# Patient Record
Sex: Male | Born: 1935 | Hispanic: No | Smoking: Former smoker
Health system: Southern US, Community
[De-identification: ages and names within clinical notes are randomized; demographics above are authoritative.]

## PROBLEM LIST (undated history)

## (undated) DIAGNOSIS — G40909 Epilepsy, unspecified, not intractable, without status epilepticus: Secondary | ICD-10-CM

## (undated) DIAGNOSIS — I10 Essential (primary) hypertension: Secondary | ICD-10-CM

## (undated) DIAGNOSIS — I639 Cerebral infarction, unspecified: Secondary | ICD-10-CM

## (undated) DIAGNOSIS — F0390 Unspecified dementia without behavioral disturbance: Secondary | ICD-10-CM

## (undated) DIAGNOSIS — S0990XA Unspecified injury of head, initial encounter: Secondary | ICD-10-CM

## (undated) DIAGNOSIS — E785 Hyperlipidemia, unspecified: Secondary | ICD-10-CM

## (undated) HISTORY — DX: Hyperlipidemia, unspecified: E78.5

## (undated) HISTORY — DX: Unspecified injury of head, initial encounter: S09.90XA

## (undated) HISTORY — DX: Epilepsy, unspecified, not intractable, without status epilepticus: G40.909

## (undated) HISTORY — DX: Unspecified dementia, unspecified severity, without behavioral disturbance, psychotic disturbance, mood disturbance, and anxiety: F03.90

## (undated) HISTORY — DX: Cerebral infarction, unspecified: I63.9

## (undated) HISTORY — PX: OTHER SURGICAL HISTORY: SHX169

## (undated) HISTORY — DX: Essential (primary) hypertension: I10

---

## 1968-02-19 DIAGNOSIS — S0990XA Unspecified injury of head, initial encounter: Secondary | ICD-10-CM

## 1968-02-19 HISTORY — DX: Unspecified injury of head, initial encounter: S09.90XA

## 2008-09-26 LAB — HM COLONOSCOPY: HM Colonoscopy: NORMAL

## 2009-11-21 ENCOUNTER — Ambulatory Visit: Payer: Self-pay | Admitting: Internal Medicine

## 2009-11-21 DIAGNOSIS — H9190 Unspecified hearing loss, unspecified ear: Secondary | ICD-10-CM | POA: Insufficient documentation

## 2009-11-21 DIAGNOSIS — M1710 Unilateral primary osteoarthritis, unspecified knee: Secondary | ICD-10-CM

## 2009-11-21 DIAGNOSIS — I10 Essential (primary) hypertension: Secondary | ICD-10-CM | POA: Insufficient documentation

## 2009-11-21 DIAGNOSIS — E785 Hyperlipidemia, unspecified: Secondary | ICD-10-CM | POA: Insufficient documentation

## 2009-11-21 DIAGNOSIS — F0390 Unspecified dementia without behavioral disturbance: Secondary | ICD-10-CM | POA: Insufficient documentation

## 2009-11-21 DIAGNOSIS — Z8679 Personal history of other diseases of the circulatory system: Secondary | ICD-10-CM | POA: Insufficient documentation

## 2009-11-21 DIAGNOSIS — R569 Unspecified convulsions: Secondary | ICD-10-CM | POA: Insufficient documentation

## 2009-11-21 DIAGNOSIS — H4089 Other specified glaucoma: Secondary | ICD-10-CM

## 2009-11-21 LAB — CONVERTED CEMR LAB
Albumin: 4.2 g/dL (ref 3.5–5.2)
BUN: 16 mg/dL (ref 6–23)
Basophils Absolute: 0 10*3/uL (ref 0.0–0.1)
CO2: 32 meq/L (ref 19–32)
Calcium: 9.5 mg/dL (ref 8.4–10.5)
Cholesterol: 174 mg/dL (ref 0–200)
Eosinophils Absolute: 0.1 10*3/uL (ref 0.0–0.7)
Glucose, Bld: 101 mg/dL — ABNORMAL HIGH (ref 70–99)
HCT: 41.7 % (ref 39.0–52.0)
HDL: 74.8 mg/dL (ref >39.00)
Hemoglobin: 14.1 g/dL (ref 13.0–17.0)
Leukocytes, UA: NEGATIVE
Lymphs Abs: 3 10*3/uL (ref 0.7–4.0)
MCHC: 33.8 g/dL (ref 30.0–36.0)
Neutro Abs: 2.9 10*3/uL (ref 1.4–7.7)
Potassium: 4.1 meq/L (ref 3.5–5.1)
RDW: 14.4 % (ref 11.5–14.6)
Sodium: 143 meq/L (ref 135–145)
Specific Gravity, Urine: 1.025 (ref 1.000–1.030)
TSH: 2.88 microintl units/mL (ref 0.35–5.50)
Urobilinogen, UA: 0.2 (ref 0.0–1.0)

## 2009-12-29 ENCOUNTER — Encounter: Payer: Self-pay | Admitting: Internal Medicine

## 2009-12-29 ENCOUNTER — Telehealth: Payer: Self-pay | Admitting: Internal Medicine

## 2010-01-02 ENCOUNTER — Encounter: Payer: Self-pay | Admitting: Internal Medicine

## 2010-01-17 ENCOUNTER — Encounter: Payer: Self-pay | Admitting: Internal Medicine

## 2010-01-23 ENCOUNTER — Telehealth: Payer: Self-pay | Admitting: Internal Medicine

## 2010-02-27 ENCOUNTER — Telehealth: Payer: Self-pay | Admitting: Internal Medicine

## 2010-03-01 ENCOUNTER — Telehealth: Payer: Self-pay | Admitting: Internal Medicine

## 2010-03-05 ENCOUNTER — Encounter
Admission: RE | Admit: 2010-03-05 | Discharge: 2010-03-20 | Payer: Self-pay | Source: Home / Self Care | Attending: Internal Medicine | Admitting: Internal Medicine

## 2010-03-20 NOTE — Letter (Signed)
Summary: Lipid Letter  [facility]  [address] [place], [place] [address]   Phone: [phone]  Fax: [phone]    11/21/2009  William Mills [address] [place], [place]  [address]  Dear William Mills:  We have carefully reviewed your last lipid profile from 11/21/2009 and the results are noted below with a summary of recommendations for lipid management.    Cholesterol:       174     Goal: <200   HDL "good" Cholesterol:   23.76     Goal: >40   LDL "bad" Cholesterol:   80     Goal: <130   Triglycerides:       96.0     Goal: <150    your other labs look great    TLC Diet (Therapeutic Lifestyle Change): Saturated Fats & Transfatty acids should be kept < 7% of total calories ***Reduce Saturated Fats Polyunstaurated Fat can be up to 10% of total calories Monounsaturated Fat Fat can be up to 20% of total calories Total Fat should be no greater than 25-35% of total calories Carbohydrates should be 50-60% of total calories Protein should be approximately 15% of total calories Fiber should be at least 20-30 grams a day ***Increased fiber may help lower LDL Total Cholesterol should be < 200mg /day Consider adding plant stanol/sterols to diet (example: Benacol spread) ***A higher intake of unsaturated fat may reduce Triglycerides and Increase HDL    Adjunctive Measures (may lower LIPIDS and reduce risk of Heart Attack) include: Aerobic Exercise (20-30 minutes 3-4 times a week) Limit Alcohol Consumption Weight Reduction Aspirin 75-81 mg a day by mouth (if not allergic or contraindicated) Dietary Fiber 20-30 grams a day by mouth     Current Medications: 1)    Diovan Hct 160-12.5 Mg Tabs (Valsartan-hydrochlorothiazide) .... Take 1 tablet by mouth once a day 2)    Lipitor 20 Mg Tabs (Atorvastatin calcium) .... Take 1 tablet by mouth once a day 3)    Phenobarbital 97.2 Mg Tabs (Phenobarbital) .... Take 1 tablet by mouth once a day 4)    Nifedical Xl 60 Mg  Xr24h-tab (Nifedipine) .... Take 1 tablet by mouth once a day 5)    Prednisolone Eye Drops  6)    Dorzolamide Hcl 2 % Soln (Dorzolamide hcl) 7)    Muro Drops  8)    Brimonidine Tartrate Drops   If you have any questions, please call. We appreciate being able to work with you.   Sincerely,    [facility] Etta Grandchild MD

## 2010-03-20 NOTE — Progress Notes (Signed)
Summary: Outpt REFERRAL  Phone Note From Other Clinic   Caller: [phone] -Therapist Summary of Call: Pt was seen by physical therapist - They seen no reason why pt should have home health PT but reccomend referral to outpt PT. This is to work on gait out in the community due to vestibular & visual issues.  Initial call taken by: Lamar Sprinkles, CMA,  January 23, 2010 3:54 PM

## 2010-03-20 NOTE — Miscellaneous (Signed)
Summary: Face to face visit/[facility]  Face to face visit/[facility]   Imported By: Sherian Rein 01/22/2010 11:10:21  _____________________________________________________________________  External Attachment:    Type:   Image     Comment:   External Document

## 2010-03-20 NOTE — Miscellaneous (Signed)
Summary: Updated Face to face visit / [facility]  Updated Face to face visit / [facility]   Imported By: Lennie Odor 01/23/2010 16:17:50  _____________________________________________________________________  External Attachment:    Type:   Image     Comment:   External Document

## 2010-03-20 NOTE — Letter (Signed)
Summary: Patient's Med List/[facility]  Patient's Med List/[facility]   Imported By: Sherian Rein 01/05/2010 13:12:21  _____________________________________________________________________  External Attachment:    Type:   Image     Comment:   External Document

## 2010-03-20 NOTE — Assessment & Plan Note (Signed)
Summary: NEW / MEDICARE / #  CD   Vital Signs:  Patient profile:   76 year old male Height:      74 inches Weight:      225 pounds BMI:     28.99 O2 Sat:      98 % on Room air Temp:     98.3 degrees F oral Pulse rate:   62 / minute Pulse rhythm:   regular Resp:     16 per minute BP sitting:   128 / 78  (left arm) Cuff size:   large  Vitals Entered By: Rock Nephew CMA (November 21, 2009 9:24 AM)  Nutrition Counseling: Patient's BMI is greater than 25 and therefore counseled on weight management options.  O2 Flow:  Room air CC: New to establish, Hypertension Management, Preventive Care Is Patient Diabetic? No Pain Assessment Patient in pain? no       Vision Screening:Left eye with correction: 20 / 80 Right eye with correction: 20 / 25  Color vision testing: normal       CC:  New to establish, Hypertension Management, and Preventive Care.  History of Present Illness: Here for Medicare AWV:  1.   Risk factors based on Past M, S, F history: yes see notes 2.   Physical Activities: very active 3.   Depression/mood: mood is very good 4.   Hearing: some decrease noted with whispered voice at 2 feet 5.   ADL's: he is independent in his self care and eating 6.   Fall Risk: some noted, referred to PT for balance, strength, conditioning 7.   Home Safety: very good, lives with his daughter 43.   Height, weight, &visual acuity: yes 9.   Counseling: see notes 10.   Labs ordered based on risk factors: yes 11.           Referral Coordination: yes,done 12.           Care Plan: see notes 13.            Cognitive Assessment : done, some deficits noted from HI and CVA  He complains of knees feeling unstable and occasionally giving away. right is worse than left.  Hypertension History:      He denies chest pain, palpitations, dyspnea with exertion, orthopnea, PND, peripheral edema, visual symptoms, neurologic problems, syncope, and side effects from treatment.        Positive  major cardiovascular risk factors include male age 73 years old or older, hyperlipidemia, and hypertension.  Negative major cardiovascular risk factors include negative family history for ischemic heart disease and non-tobacco-user status.        Positive history for target organ damage include prior stroke (or TIA).  Further assessment for target organ damage reveals no history of ASHD, cardiac end-organ damage (CHF/LVH), peripheral vascular disease, renal insufficiency, or hypertensive retinopathy.     Preventive Screening-Counseling & Management  Alcohol-Tobacco     Smoking Status: quit  Current Medications (verified): 1)  Diovan Hct 160-12.5 Mg Tabs (Valsartan-Hydrochlorothiazide) .... Take 1 Tablet By Mouth Once A Day 2)  Lipitor 20 Mg Tabs (Atorvastatin Calcium) .... Take 1 Tablet By Mouth Once A Day 3)  Phenobarbital 97.2 Mg Tabs (Phenobarbital) .... Take 1 Tablet By Mouth Once A Day 4)  Nifedical Xl 60 Mg Xr24h-Tab (Nifedipine) .... Take 1 Tablet By Mouth Once A Day 5)  Prednisolone Eye Drops 6)  Dorzolamide Hcl 2 % Soln (Dorzolamide Hcl) 7)  Muro Drops 8)  Brimonidine Tartrate  Drops  Allergies (verified): 1)  ! Penicillin 2)  ! Dilantin 3)  ! Tegretol 4)  ! * Mri  Past History:  Past Medical History: Dementia Hyperlipidemia Hypertension Seizure disorder Cerebrovascular accident, hx of Right frontal head injury 1970  Past Surgical History: release of right undescended testicle  Social History: Smoking Status:  quit  Review of Systems       The patient complains of vision loss, decreased hearing, and difficulty walking.  The patient denies anorexia, fever, weight loss, weight gain, hoarseness, chest pain, syncope, dyspnea on exertion, peripheral edema, prolonged cough, headaches, hemoptysis, abdominal pain, melena, hematochezia, severe indigestion/heartburn, hematuria, muscle weakness, suspicious skin lesions, depression, abnormal bleeding, enlarged lymph nodes,  angioedema, and testicular masses.   ENT:  Complains of decreased hearing; denies difficulty swallowing, ear discharge, earache, hoarseness, nasal congestion, nosebleeds, postnasal drainage, ringing in ears, sinus pressure, and sore throat. GU:  Denies discharge, dysuria, hematuria, incontinence, nocturia, urinary frequency, and urinary hesitancy. Neuro:  Complains of memory loss and poor balance; denies brief paralysis, difficulty with concentration, disturbances in coordination, falling down, headaches, inability to speak, numbness, seizures, sensation of room spinning, tingling, tremors, visual disturbances, and weakness.  Physical Exam  General:  alert, well-developed, well-nourished, well-hydrated, appropriate dress, normal appearance, healthy-appearing, cooperative to examination, and good hygiene.   Head:  right frontal scalp scar and keloid Eyes:  vision grossly intact, pupils equal, pupils round, and pupils reactive to light.   Ears:  External ear exam shows no significant lesions or deformities.  Otoscopic examination reveals clear canals, tympanic membranes are intact bilaterally without bulging, retraction, inflammation or discharge. Hearing is grossly normal bilaterally. Nose:  External nasal examination shows no deformity or inflammation. Nasal mucosa are pink and moist without lesions or exudates. Mouth:  Oral mucosa and oropharynx without lesions or exudates.  Teeth in good repair. Neck:  supple, full ROM, no masses, no thyromegaly, no thyroid nodules or tenderness, no JVD, normal carotid upstroke, no carotid bruits, no cervical lymphadenopathy, and no neck tenderness.   Chest Wall:  No deformities, masses, tenderness or gynecomastia noted. Breasts:  No masses or gynecomastia noted Lungs:  Normal respiratory effort, chest expands symmetrically. Lungs are clear to auscultation, no crackles or wheezes. Heart:  Normal rate and regular rhythm. S1 and S2 normal without gallop, murmur,  click, rub or other extra sounds. Abdomen:  soft, non-tender, normal bowel sounds, no distention, no masses, no guarding, no rigidity, no rebound tenderness, no abdominal hernia, no inguinal hernia, no hepatomegaly, no splenomegaly, and abdominal scar(s).   Rectal:  No external abnormalities noted. Normal sphincter tone. No rectal masses or tenderness. Genitalia:  circumcised, no hydrocele, no varicocele, no scrotal masses, no cutaneous lesions, no urethral discharge, and R testes atrophic.   Prostate:  no nodules, no asymmetry, no induration, and 2+ enlarged.   Msk:  he has changes in his knees c/ djd but no effusions or joint laxity is noted. Pulses:  R and L carotid,radial,femoral,dorsalis pedis and posterior tibial pulses are full and equal bilaterally Extremities:  No clubbing, cyanosis, edema, or deformity noted with normal full range of motion of all joints.   Neurologic:  alert & oriented X3, cranial nerves II-XII intact, strength normal in all extremities, sensation intact to light touch, sensation intact to pinprick, DTRs symmetrical and normal, and ataxic gait.   Skin:  turgor normal, color normal, no rashes, no suspicious lesions, no ecchymoses, no petechiae, no purpura, no ulcerations, and no edema.   Cervical Nodes:  No  lymphadenopathy noted Axillary Nodes:  No palpable lymphadenopathy Inguinal Nodes:  No significant adenopathy Psych:  Oriented X3, normally interactive, good eye contact, not anxious appearing, not depressed appearing, not agitated, not suicidal, not homicidal, easily distracted, poor concentration, and memory impairment.     Impression & Recommendations:  Problem # 1:  ROUTINE GENERAL MEDICAL EXAM@HEALTH  CARE FACL (ICD-V70.0) Assessment New  Orders: Venipuncture ([address]) TLB-Lipid Panel (80061-LIPID) TLB-BMP (Basic Metabolic Panel-BMET) (80048-METABOL) TLB-CBC Platelet - w/Differential (85025-CBCD) TLB-Hepatic/Liver Function Pnl (80076-HEPATIC) TLB-TSH  (Thyroid Stimulating Hormone) (84443-TSH) TLB-Udip w/ Micro (81001-URINE) TLB-PSA (Prostate Specific Antigen) (84153-PSA) MC -Subsequent Annual Wellness Visit [phone])  Colonoscopy: Normal (09/26/2008)  Discussed using sunscreen, use of alcohol, drug use, self testicular exam, routine dental care, routine eye care, routine physical exam, seat belts, multiple vitamins, osteoporosis prevention, adequate calcium intake in diet, and recommendations for immunizations.  Discussed exercise and checking cholesterol.  Also recommend checking PSA.  Problem # 2:  DEGENERATIVE JOINT DISEASE, BOTH KNEES, SEVERE (ICD-715.96) Assessment: New  Orders: Physical Therapy Referral (PT)  Problem # 3:  HYPERTENSION (ICD-401.9) Assessment: Improved  His updated medication list for this problem includes:    Diovan Hct 160-12.5 Mg Tabs (Valsartan-hydrochlorothiazide) .Marland Kitchen... Take 1 tablet by mouth once a day    Nifedical Xl 60 Mg Xr24h-tab (Nifedipine) .Marland Kitchen... Take 1 tablet by mouth once a day  Orders: Venipuncture ([address]) TLB-Lipid Panel (80061-LIPID) TLB-BMP (Basic Metabolic Panel-BMET) (80048-METABOL) TLB-CBC Platelet - w/Differential (85025-CBCD) TLB-Hepatic/Liver Function Pnl (80076-HEPATIC) TLB-TSH (Thyroid Stimulating Hormone) (84443-TSH) TLB-Udip w/ Micro (81001-URINE) TLB-PSA (Prostate Specific Antigen) (84153-PSA)  Problem # 4:  SEIZURE DISORDER (ICD-780.39) Assessment: Improved  His updated medication list for this problem includes:    Phenobarbital 97.2 Mg Tabs (Phenobarbital) .Marland Kitchen... Take 1 tablet by mouth once a day  Orders: Venipuncture ([address]) T- * Misc. Laboratory test [phone] TLB-Lipid Panel (80061-LIPID) TLB-BMP (Basic Metabolic Panel-BMET) (80048-METABOL) TLB-CBC Platelet - w/Differential (85025-CBCD) TLB-Hepatic/Liver Function Pnl (80076-HEPATIC) TLB-TSH (Thyroid Stimulating Hormone) (84443-TSH) TLB-Udip w/ Micro (81001-URINE) TLB-PSA (Prostate Specific Antigen)  (84153-PSA)  Problem # 5:  DEMENTIA (ICD-294.8) Assessment: Unchanged  Problem # 6:  UNSPECIFIED HEARING LOSS (ICD-389.9) Assessment: New  Orders: Audiology (Audio)  Problem # 7:  OTHER SPECIFIED GLAUCOMA (ICD-365.89) Assessment: Unchanged  Orders: Ophthalmology Referral (Ophthalmology)  Complete Medication List: 1)  Diovan Hct 160-12.5 Mg Tabs (Valsartan-hydrochlorothiazide) .... Take 1 tablet by mouth once a day 2)  Lipitor 20 Mg Tabs (Atorvastatin calcium) .... Take 1 tablet by mouth once a day 3)  Phenobarbital 97.2 Mg Tabs (Phenobarbital) .... Take 1 tablet by mouth once a day 4)  Nifedical Xl 60 Mg Xr24h-tab (Nifedipine) .... Take 1 tablet by mouth once a day 5)  Prednisolone Eye Drops  6)  Dorzolamide Hcl 2 % Soln (Dorzolamide hcl) 7)  Muro Drops  8)  Brimonidine Tartrate Drops   Hypertension Assessment/Plan:      The patient's hypertensive risk group is category C: Target organ damage and/or diabetes.  Today's blood pressure is 128/78.  His blood pressure goal is < 140/90.  Colorectal Screening:  Current Recommendations:    Hemoccult: NEG X 1 today  Colonoscopy Results:    Date of Exam: 09/26/2008    Results: Normal  PSA Screening:    Reviewed PSA screening recommendations: PSA ordered  Patient Instructions: 1)  Please schedule a follow-up appointment in 2 months. 2)  It is important that you exercise regularly at least 20 minutes 5 times a week. If you develop chest pain, have severe difficulty breathing, or feel very tired ,  stop exercising immediately and seek medical attention. 3)  Check your Blood Pressure regularly. If it is above 130/80: you should make an appointment.

## 2010-03-20 NOTE — Progress Notes (Signed)
Summary: refills  Phone Note Call from Patient   Caller: walk in sheet  / daughter Peggye Ley Summary of Call: needs refills called in to [facility] [address] or for pick up.  has enough to last to Monday.   diovan, lipitor, phenobarbital, eye drops:dorzolamide hcl and brimonidine tartrte drops. cell for daughter is [phone] Initial call taken by: Hilarie Fredrickson,  December 29, 2009 12:02 PM  Follow-up for Phone Call        [facility] for refill of all requested?  Follow-up by: Lamar Sprinkles, CMA,  December 29, 2009 12:34 PM  Additional Follow-up for Phone Call Additional follow up Details #1::        yes Additional Follow-up by: Etta Grandchild MD,  December 29, 2009 12:51 PM    Additional Follow-up for Phone Call Additional follow up Details #2::    Unsure of specific details of eyedrops needed. Attempted to call daughter, # disconnected, verified # with pt - it is correct. Pt was sure of cosopt eyedrops but unsure of alphagan. [facility]them and requested med list to be faxed to my attention........................Marland KitchenLamar Sprinkles, CMA  January 01, 2010 11:34 AM  Recieved [facility] w[facility]. EMR updated & refills sent in.  Follow-up by: Lamar Sprinkles, CMA,  January 02, 2010 11:08 AM  New/Updated Medications: LUMIGAN 0.01 % SOLN (BIMATOPROST) 1 drop left eye at bedtime ATROPINE SULFATE 1 % SOLN (ATROPINE SULFATE) 1 drop two times a day COSOPT 22.3-6.8 MG/ML SOLN (DORZOLAMIDE HCL-TIMOLOL MAL) 1 drop each eye two times a day COMBIGAN 0.2-0.5 % SOLN (BRIMONIDINE TARTRATE-TIMOLOL) 1 drop right  two times a day Prescriptions: LUMIGAN 0.01 % SOLN (BIMATOPROST) 1 drop left eye at bedtime  #1 x 3   Entered by:   Lamar Sprinkles, CMA   Authorized by:   Etta Grandchild MD   Signed by:   Lamar Sprinkles, CMA on 01/02/2010   Method used:   Electronically to        [facility] [id number] (retail)       [address] [place], [place]   [address]       Ph: [phone]       Fax: [phone]   RxID:   [id number] ATROPINE SULFATE 1 % SOLN (ATROPINE SULFATE) 1 drop two times a day  #1 x 3   Entered by:   Lamar Sprinkles, CMA   Authorized by:   Etta Grandchild MD   Signed by:   Lamar Sprinkles, CMA on 01/02/2010   Method used:   Electronically to        [facility] [id number]* (retail)       [address] [place], [place]  [address]       Ph: [phone]       Fax: [phone]   RxID:   [id number] COMBIGAN 0.2-0.5 % SOLN (BRIMONIDINE TARTRATE-TIMOLOL) 1 drop right  two times a day  #1 x 3   Entered by:   Lamar Sprinkles, CMA   Authorized by:   Etta Grandchild MD   Signed by:   Lamar Sprinkles, CMA on 01/02/2010   Method used:   Electronically to        [facility] [id number]* (retail)       [address]       [place], [place]  [address]  Ph: [phone]       Fax: [phone]   RxID:   [id number] LIPITOR 20 MG TABS (ATORVASTATIN CALCIUM) Take 1 tablet by mouth once a day  #90 x 3   Entered by:   Lamar Sprinkles, CMA   Authorized by:   Etta Grandchild MD   Signed by:   Lamar Sprinkles, CMA on 12/29/2009   Method used:   Electronically to        [id number]* (retail)       [address] [place], [place]  [address]       Ph: [phone]       Fax: [phone]   RxID:   [id number] DIOVAN HCT 160-12.5 MG TABS (VALSARTAN-HYDROCHLOROTHIAZIDE) Take 1 tablet by mouth once a day  #90 x 3   Entered by:   Lamar Sprinkles, CMA   Authorized by:   Etta Grandchild MD   Signed by:   Lamar Sprinkles, CMA on 12/29/2009   Method used:   Electronically to        [facility] [id number]* (retail)       [address] [place], [place]  [address]       Ph: [phone]       Fax: [phone]   RxID:   [id number]

## 2010-03-20 NOTE — Therapy (Signed)
Summary: [facility]   [facility]   Imported By: Lennie Odor 01/08/2010 15:41:08  _____________________________________________________________________  External Attachment:    Type:   Image     Comment:   External Document

## 2010-03-21 ENCOUNTER — Ambulatory Visit: Payer: Medicare Other | Attending: Internal Medicine | Admitting: Physical Therapy

## 2010-03-21 DIAGNOSIS — R269 Unspecified abnormalities of gait and mobility: Secondary | ICD-10-CM | POA: Insufficient documentation

## 2010-03-21 DIAGNOSIS — IMO0001 Reserved for inherently not codable concepts without codable children: Secondary | ICD-10-CM | POA: Insufficient documentation

## 2010-03-22 NOTE — Progress Notes (Signed)
Summary: med question  Phone Note From Pharmacy   Caller: [facility] Summary of Call: Recieved fax stating that they received rx for phenobarb 97.2. Patient is also taking Nifedipine, per pharmacy the two drugs interact. The phenobarb can decreas the plasma levels of the nifedipine. They want to know if you want patient  to take both medication. Please advise Thaks.Marland KitchenMarland KitchenAlvy Beal Archie CMA  March 01, 2010 10:01 AM   Follow-up for Phone Call        yes Follow-up by: Etta Grandchild MD,  March 01, 2010 10:40 AM  Additional Follow-up for Phone Call Additional follow up Details #1::        Pharmacy notified via fax [phone].Alvy Beal Archie CMA  March 01, 2010 10:47 AM

## 2010-03-22 NOTE — Progress Notes (Signed)
Summary: RFs  Phone Note Call from Patient   Caller: Nannette - [phone] Summary of Call: Pt's daughter called and left vm. Pt needs refill on phenobarbitol and rx's now [facility] but did not specify which one.  Initial call taken by: Lamar Sprinkles, CMA,  February 27, 2010 3:47 PM  Follow-up for Phone Call        Rfs sent in daughter informed.  Follow-up by: Lamar Sprinkles, CMA,  February 28, 2010 4:35 PM    Prescriptions: COMBIGAN 0.2-0.5 % SOLN (BRIMONIDINE TARTRATE-TIMOLOL) 1 drop right  two times a day  #1 x 6   Entered by:   Lamar Sprinkles, CMA   Authorized by:   Etta Grandchild MD   Signed by:   Lamar Sprinkles, CMA on 02/28/2010   Method used:   Electronically to        [facility] (retail)       [address]       [place], [place]  [address]       Ph: [phone]       Fax: [phone]   RxID:   [id number] ATROPINE SULFATE 1 % SOLN (ATROPINE SULFATE) 1 drop two times a day  #1 x 6   Entered by:   Lamar Sprinkles, CMA   Authorized by:   Etta Grandchild MD   Signed by:   Lamar Sprinkles, CMA on 02/28/2010   Method used:   Electronically to        [facility] (retail)       [address]       [place], [place]  [address]       Ph: [phone]       Fax: [phone]   RxID:   [id number] LUMIGAN 0.01 % SOLN (BIMATOPROST) 1 drop left eye at bedtime  #1 x 6   Entered by:   Lamar Sprinkles, CMA   Authorized by:   Etta Grandchild MD   Signed by:   Lamar Sprinkles, CMA on 02/28/2010   Method used:   Electronically to        [facility] (retail)       [address]       [place], [place]  [address]       Ph: [phone]       Fax: [phone]   RxID:   [id number] NIFEDICAL XL 60 MG XR24H-TAB (NIFEDIPINE) Take 1 tablet by mouth once a day  #90 x 1   Entered by:   Lamar Sprinkles, CMA   Authorized by:   Etta Grandchild MD   Signed by:   Lamar Sprinkles, CMA on  02/28/2010   Method used:   Electronically to        [facility] (retail)       [address]       [place], [place]  [address]       Ph: [phone]       Fax: [phone]   RxID:   [id number] LIPITOR 20 MG TABS (ATORVASTATIN CALCIUM) Take 1 tablet by mouth once a day  #90 x 1   Entered by:   Lamar Sprinkles, CMA   Authorized by:   Etta Grandchild MD   Signed  by:   Lamar Sprinkles, CMA on 02/28/2010   Method used:   Electronically to        [facility] (retail)       [address]       [place], [place]  [address]       Ph: [phone]       Fax: [phone]   RxID:   [id number] DIOVAN HCT 160-12.5 MG TABS (VALSARTAN-HYDROCHLOROTHIAZIDE) Take 1 tablet by mouth once a day  #90 x 1   Entered by:   Lamar Sprinkles, CMA   Authorized by:   Etta Grandchild MD   Signed by:   Lamar Sprinkles, CMA on 02/28/2010   Method used:   Electronically to        [facility] (retail)       [address]       [place], [place]  [address]       Ph: [phone]       Fax: [phone]   RxID:   [id number] PHENOBARBITAL 97.2 MG TABS (PHENOBARBITAL) Take 1 tablet by mouth once a day  #90 x 1   Entered by:   Lamar Sprinkles, CMA   Authorized by:   Etta Grandchild MD   Signed by:   Lamar Sprinkles, CMA on 02/28/2010   Method used:   Telephoned to ...       [facility]       [address]       [place], [place]  [address]       Ph: [phone]       Fax: [phone]   RxID:   [id number]

## 2010-03-27 ENCOUNTER — Ambulatory Visit: Payer: Medicare Other | Admitting: Physical Therapy

## 2010-03-29 ENCOUNTER — Ambulatory Visit: Payer: Medicare Other | Admitting: Physical Therapy

## 2010-04-02 ENCOUNTER — Ambulatory Visit: Payer: Medicare Other | Admitting: Physical Therapy

## 2010-04-05 ENCOUNTER — Ambulatory Visit: Payer: Medicare Other | Admitting: Physical Therapy

## 2010-04-05 ENCOUNTER — Encounter: Payer: Self-pay | Admitting: Internal Medicine

## 2010-04-26 NOTE — Miscellaneous (Signed)
Summary: D/C from PT/[place]  D/C from PT/[place] [facility]   Imported By: Lester Townsend 04/18/2010 08:36:44  _____________________________________________________________________  External Attachment:    Type:   Image     Comment:   External Document

## 2010-07-12 ENCOUNTER — Encounter: Payer: Self-pay | Admitting: Internal Medicine

## 2010-07-17 ENCOUNTER — Ambulatory Visit (INDEPENDENT_AMBULATORY_CARE_PROVIDER_SITE_OTHER): Payer: Medicare Other | Admitting: Internal Medicine

## 2010-07-17 ENCOUNTER — Encounter: Payer: Self-pay | Admitting: Internal Medicine

## 2010-07-17 ENCOUNTER — Other Ambulatory Visit (INDEPENDENT_AMBULATORY_CARE_PROVIDER_SITE_OTHER): Payer: Medicare Other

## 2010-07-17 DIAGNOSIS — M1710 Unilateral primary osteoarthritis, unspecified knee: Secondary | ICD-10-CM

## 2010-07-17 DIAGNOSIS — E785 Hyperlipidemia, unspecified: Secondary | ICD-10-CM

## 2010-07-17 DIAGNOSIS — I10 Essential (primary) hypertension: Secondary | ICD-10-CM

## 2010-07-17 DIAGNOSIS — Z23 Encounter for immunization: Secondary | ICD-10-CM

## 2010-07-17 LAB — LIPID PANEL
Cholesterol: 162 mg/dL (ref 0–200)
LDL Cholesterol: 66 mg/dL (ref 0–99)
VLDL: 29.8 mg/dL (ref 0.0–40.0)

## 2010-07-17 LAB — COMPREHENSIVE METABOLIC PANEL
AST: 27 U/L (ref 0–37)
Albumin: 3.9 g/dL (ref 3.5–5.2)
Alkaline Phosphatase: 67 U/L (ref 39–117)
Potassium: 3.9 mEq/L (ref 3.5–5.1)
Sodium: 141 mEq/L (ref 135–145)
Total Protein: 6.9 g/dL (ref 6.0–8.3)

## 2010-07-17 NOTE — Progress Notes (Signed)
Subjective:    Patient ID: William Mills, male    DOB: May 13, 1934, 76 y.o.   MRN: [id number]  Hypertension This is a chronic problem. The current episode started more than 1 year ago. The problem has been gradually improving since onset. The problem is controlled. Pertinent negatives include no anxiety, blurred vision, chest pain, headaches, malaise/fatigue, neck pain, orthopnea, palpitations, peripheral edema, PND, shortness of breath or sweats. There are no associated agents to hypertension. Past treatments include angiotensin blockers, diuretics and calcium channel blockers. The current treatment provides significant improvement. There is no history of angina or kidney disease.      Review of Systems  Constitutional: Negative for fever, chills, malaise/fatigue, diaphoresis, activity change, appetite change, fatigue and unexpected weight change.  HENT: Negative for facial swelling, trouble swallowing, neck pain and voice change.   Eyes: Negative for blurred vision, photophobia, pain, redness and visual disturbance.  Respiratory: Negative for cough, chest tightness, shortness of breath, wheezing and stridor.   Cardiovascular: Negative for chest pain, palpitations, orthopnea and PND.  Gastrointestinal: Negative for nausea, vomiting, abdominal pain, diarrhea and constipation.  Genitourinary: Negative for dysuria, urgency, frequency, hematuria, flank pain, decreased urine volume, enuresis and difficulty urinating.  Musculoskeletal: Positive for arthralgias (knees). Negative for myalgias, back pain, joint swelling and gait problem.  Skin: Negative for color change, pallor and rash.  Neurological: Negative for dizziness, tremors, seizures, syncope, facial asymmetry, speech difficulty, weakness, light-headedness, numbness and headaches.  Hematological: Negative for adenopathy. Does not bruise/bleed easily.  Psychiatric/Behavioral: Negative.        Objective:   Physical Exam  Vitals  reviewed. Constitutional: He is oriented to person, place, and time. He appears well-developed and well-nourished. No distress.  HENT:  Head: Normocephalic and atraumatic.  Right Ear: External ear normal.  Left Ear: External ear normal.  Nose: Nose normal.  Mouth/Throat: Oropharynx is clear and moist. No oropharyngeal exudate.  Eyes: Conjunctivae and EOM are normal. Pupils are equal, round, and reactive to light. Right eye exhibits no discharge. Left eye exhibits no discharge. No scleral icterus.  Neck: Normal range of motion. Neck supple. No JVD present. No tracheal deviation present. No thyromegaly present.  Cardiovascular: Normal rate, regular rhythm, normal heart sounds and intact distal pulses.  Exam reveals no gallop and no friction rub.   No murmur heard. Pulmonary/Chest: Effort normal and breath sounds normal. No stridor. No respiratory distress. He has no wheezes. He has no rales. He exhibits no tenderness.  Abdominal: Soft. Bowel sounds are normal. He exhibits no distension and no mass. There is no tenderness. There is no rebound and no guarding.  Musculoskeletal: Normal range of motion. He exhibits no edema and no tenderness.  Lymphadenopathy:    He has no cervical adenopathy.  Neurological: He is alert and oriented to person, place, and time. He has normal reflexes. No cranial nerve deficit. Coordination normal.  Skin: Skin is warm and dry. No rash noted. He is not diaphoretic. No erythema. No pallor.  Psychiatric: He has a normal mood and affect. His behavior is normal. Judgment and thought content normal.        Lab Results  Component Value Date   WBC 6.3 11/21/2009   HGB 14.1 11/21/2009   HCT 41.7 11/21/2009   PLT 249.0 11/21/2009   CHOL 174 11/21/2009   TRIG 96.0 11/21/2009   HDL 74.80 11/21/2009   ALT 24 11/21/2009   AST 22 11/21/2009   NA 143 11/21/2009   K 4.1 11/21/2009   CL 103  11/21/2009   CREATININE 1.0 11/21/2009   BUN 16 11/21/2009   CO2 32 11/21/2009   TSH 2.88  11/21/2009   PSA 1.36 11/21/2009    Assessment & Plan:

## 2010-07-17 NOTE — Patient Instructions (Signed)

## 2010-07-17 NOTE — Assessment & Plan Note (Signed)
BP is well controlled, I will monitor his lytes and renal function today 

## 2010-07-17 NOTE — Assessment & Plan Note (Signed)
He is doing well on lipitor, I will check his labs today

## 2010-07-17 NOTE — Assessment & Plan Note (Signed)
No changes, he is doing well

## 2010-08-23 ENCOUNTER — Other Ambulatory Visit: Payer: Self-pay | Admitting: Internal Medicine

## 2010-08-25 ENCOUNTER — Other Ambulatory Visit: Payer: Self-pay | Admitting: Internal Medicine

## 2010-08-27 ENCOUNTER — Telehealth: Payer: Self-pay | Admitting: *Deleted

## 2010-08-27 NOTE — Telephone Encounter (Signed)
LMOM that Rx is ready for p/u & must have f/u appointment per Dr Yetta Barre.

## 2010-08-28 ENCOUNTER — Other Ambulatory Visit: Payer: Self-pay | Admitting: Internal Medicine

## 2010-09-03 ENCOUNTER — Telehealth: Payer: Self-pay

## 2010-09-03 ENCOUNTER — Other Ambulatory Visit: Payer: Self-pay

## 2010-09-03 MED ORDER — VALSARTAN-HYDROCHLOROTHIAZIDE 160-12.5 MG PO TABS: 1.0000 | ORAL_TABLET | Freq: Every day | ORAL | Status: DC

## 2010-09-03 MED ORDER — NIFEDIPINE ER OSMOTIC RELEASE 60 MG PO TB24: 60.0000 mg | ORAL_TABLET | Freq: Every day | ORAL | Status: DC

## 2010-09-03 NOTE — Telephone Encounter (Signed)
Pt's daughter is aware that Rx was ready for pt to pick on Friday but because it still hadn't been picked up today and another request was sent from the pharmacy, [facility] faxed the Rx to the pharmacy

## 2010-09-03 NOTE — Telephone Encounter (Signed)
Received refill request for phenobarb 97.2

## 2010-10-29 ENCOUNTER — Telehealth: Payer: Self-pay | Admitting: *Deleted

## 2010-10-29 NOTE — Telephone Encounter (Signed)
Needs to be seen

## 2010-10-29 NOTE — Telephone Encounter (Signed)
Patient requesting RF of phenobarb 97.2.

## 2010-10-30 NOTE — Telephone Encounter (Signed)
Pharmacy notified.

## 2010-11-02 ENCOUNTER — Other Ambulatory Visit: Payer: Self-pay | Admitting: Internal Medicine

## 2010-11-05 ENCOUNTER — Other Ambulatory Visit: Payer: Self-pay | Admitting: Internal Medicine

## 2010-11-16 ENCOUNTER — Other Ambulatory Visit: Payer: Self-pay | Admitting: *Deleted

## 2010-11-16 MED ORDER — ATORVASTATIN CALCIUM 20 MG PO TABS: 20.0000 mg | ORAL_TABLET | Freq: Every day | ORAL | Status: DC

## 2010-12-05 ENCOUNTER — Telehealth: Payer: Self-pay

## 2010-12-05 MED ORDER — NIFEDIPINE ER OSMOTIC RELEASE 60 MG PO TB24: 60.0000 mg | ORAL_TABLET | Freq: Every day | ORAL | Status: DC

## 2010-12-05 NOTE — Telephone Encounter (Signed)
Refill

## 2010-12-10 ENCOUNTER — Ambulatory Visit (INDEPENDENT_AMBULATORY_CARE_PROVIDER_SITE_OTHER): Payer: Medicare Other | Admitting: Internal Medicine

## 2010-12-10 ENCOUNTER — Encounter: Payer: Self-pay | Admitting: Internal Medicine

## 2010-12-10 ENCOUNTER — Other Ambulatory Visit (INDEPENDENT_AMBULATORY_CARE_PROVIDER_SITE_OTHER): Payer: Medicare Other

## 2010-12-10 VITALS — BP 122/70 | HR 58 | Temp 98.7°F | Resp 16 | Wt 221.0 lb

## 2010-12-10 DIAGNOSIS — E785 Hyperlipidemia, unspecified: Secondary | ICD-10-CM

## 2010-12-10 DIAGNOSIS — R569 Unspecified convulsions: Secondary | ICD-10-CM

## 2010-12-10 DIAGNOSIS — I10 Essential (primary) hypertension: Secondary | ICD-10-CM

## 2010-12-10 DIAGNOSIS — Z23 Encounter for immunization: Secondary | ICD-10-CM

## 2010-12-10 LAB — COMPREHENSIVE METABOLIC PANEL
ALT: 35 U/L (ref 0–53)
Alkaline Phosphatase: 75 U/L (ref 39–117)
CO2: 32 mEq/L (ref 19–32)
Sodium: 140 mEq/L (ref 135–145)
Total Bilirubin: 0.5 mg/dL (ref 0.3–1.2)
Total Protein: 8.1 g/dL (ref 6.0–8.3)

## 2010-12-10 LAB — CBC WITH DIFFERENTIAL/PLATELET
Basophils Absolute: 0 10*3/uL (ref 0.0–0.1)
HCT: 40.3 % (ref 39.0–52.0)
Lymphs Abs: 2.9 10*3/uL (ref 0.7–4.0)
Monocytes Absolute: 0.2 10*3/uL (ref 0.1–1.0)
Monocytes Relative: 3.7 % (ref 3.0–12.0)
Platelets: 243 10*3/uL (ref 150.0–400.0)
RDW: 14.4 % (ref 11.5–14.6)

## 2010-12-10 MED ORDER — PHENOBARBITAL 97.2 MG PO TABS: 97.2000 mg | ORAL_TABLET | Freq: Every day | ORAL | Status: DC

## 2010-12-10 NOTE — Progress Notes (Signed)
Subjective:    Patient ID: William Mills, male    DOB: Dec 18, 1934, 76 y.o.   MRN: [id number]  HPI He returns for a f/up and he tells me that he feels well and offers no complaints.   Review of Systems  Constitutional: Negative for fever, chills, diaphoresis, activity change, appetite change, fatigue and unexpected weight change.  HENT: Negative.   Eyes: Negative.   Respiratory: Negative for apnea, cough, choking, chest tightness, shortness of breath, wheezing and stridor.   Cardiovascular: Negative for chest pain, palpitations and leg swelling.  Gastrointestinal: Negative for nausea, vomiting, abdominal pain, diarrhea, constipation and blood in stool.  Genitourinary: Negative for dysuria, urgency, frequency, hematuria, flank pain, decreased urine volume, enuresis and difficulty urinating.  Musculoskeletal: Negative for myalgias, back pain, joint swelling, arthralgias and gait problem.  Skin: Negative for color change, pallor, rash and wound.  Neurological: Negative for dizziness, tremors, seizures, syncope, facial asymmetry, speech difficulty, weakness, light-headedness, numbness and headaches.  Hematological: Negative for adenopathy. Does not bruise/bleed easily.  Psychiatric/Behavioral: Negative.        Objective:   Physical Exam  Vitals reviewed. Constitutional: He is oriented to person, place, and time. He appears well-developed and well-nourished. No distress.  HENT:  Head: Normocephalic and atraumatic.  Mouth/Throat: Oropharynx is clear and moist. No oropharyngeal exudate.  Eyes: Conjunctivae and EOM are normal. Pupils are equal, round, and reactive to light. Right eye exhibits no discharge. Left eye exhibits no discharge. No scleral icterus.  Neck: Normal range of motion. Neck supple. No JVD present. No tracheal deviation present. No thyromegaly present.  Cardiovascular: Normal rate, regular rhythm, normal heart sounds and intact distal pulses.  Exam reveals no gallop and no  friction rub.   No murmur heard. Pulmonary/Chest: Effort normal and breath sounds normal. No stridor. No respiratory distress. He has no wheezes. He has no rales. He exhibits no tenderness.  Abdominal: Soft. Bowel sounds are normal. He exhibits no distension and no mass. There is no tenderness. There is no rebound and no guarding.  Musculoskeletal: Normal range of motion. He exhibits no edema and no tenderness.  Lymphadenopathy:    He has no cervical adenopathy.  Neurological: He is oriented to person, place, and time. He displays normal reflexes. He exhibits normal muscle tone. Coordination normal.  Skin: Skin is warm and dry. No rash noted. He is not diaphoretic. No erythema. No pallor.  Psychiatric: He has a normal mood and affect. Judgment and thought content normal. His mood appears not anxious. His affect is not angry, not blunt, not labile and not inappropriate. His speech is delayed and tangential. His speech is not rapid and/or pressured and not slurred. He is slowed. He is not agitated, not aggressive, is not hyperactive, not withdrawn, not actively hallucinating and not combative. Thought content is not paranoid and not delusional. Cognition and memory are not impaired. He does not express impulsivity or inappropriate judgment. He does not exhibit a depressed mood. He expresses no homicidal and no suicidal ideation. He expresses no suicidal plans and no homicidal plans. He is communicative. He exhibits normal recent memory and normal remote memory. He is attentive.      Lab Results  Component Value Date   WBC 6.3 11/21/2009   HGB 14.1 11/21/2009   HCT 41.7 11/21/2009   PLT 249.0 11/21/2009   GLUCOSE 106* 07/17/2010   CHOL 162 07/17/2010   TRIG 149.0 07/17/2010   HDL 66.60 07/17/2010   LDLCALC 66 07/17/2010   ALT 36 07/17/2010  AST 27 07/17/2010   NA 141 07/17/2010   K 3.9 07/17/2010   CL 107 07/17/2010   CREATININE 0.8 07/17/2010   BUN 15 07/17/2010   CO2 27 07/17/2010   TSH 2.57  07/17/2010   PSA 1.36 11/21/2009      Assessment & Plan:

## 2010-12-10 NOTE — Patient Instructions (Signed)

## 2010-12-10 NOTE — Assessment & Plan Note (Signed)
He is doing well on atorvastatin

## 2010-12-10 NOTE — Assessment & Plan Note (Signed)
His BP is well controlled, I will check his lytes today 

## 2010-12-10 NOTE — Assessment & Plan Note (Signed)
He continues to be free of seizures, will continue phenobarb and will check his level today

## 2010-12-11 LAB — PHENOBARBITAL LEVEL: Phenobarbital: 23.8 ug/mL (ref 15.0–40.0)

## 2010-12-14 ENCOUNTER — Other Ambulatory Visit: Payer: Self-pay | Admitting: Internal Medicine

## 2010-12-14 MED ORDER — VALSARTAN-HYDROCHLOROTHIAZIDE 160-12.5 MG PO TABS: 1.0000 | ORAL_TABLET | Freq: Every day | ORAL | Status: DC

## 2011-03-01 ENCOUNTER — Other Ambulatory Visit: Payer: Self-pay | Admitting: Internal Medicine

## 2011-04-02 ENCOUNTER — Other Ambulatory Visit: Payer: Self-pay | Admitting: Internal Medicine

## 2011-04-02 ENCOUNTER — Telehealth: Payer: Self-pay

## 2011-04-02 MED ORDER — BIMATOPROST 0.01 % OP SOLN: OPHTHALMIC | Status: AC

## 2011-04-02 MED ORDER — BRIMONIDINE TARTRATE-TIMOLOL 0.2-0.5 % OP SOLN: 1.0000 [drp] | Freq: Two times a day (BID) | OPHTHALMIC | Status: AC

## 2011-04-02 NOTE — Telephone Encounter (Signed)
Patient came by and filled out a walk in sheet combigan and lumigan drops

## 2011-04-22 ENCOUNTER — Ambulatory Visit (INDEPENDENT_AMBULATORY_CARE_PROVIDER_SITE_OTHER): Payer: Medicare Other | Admitting: Internal Medicine

## 2011-04-22 ENCOUNTER — Other Ambulatory Visit (INDEPENDENT_AMBULATORY_CARE_PROVIDER_SITE_OTHER): Payer: Medicare Other

## 2011-04-22 ENCOUNTER — Encounter: Payer: Self-pay | Admitting: Internal Medicine

## 2011-04-22 DIAGNOSIS — R569 Unspecified convulsions: Secondary | ICD-10-CM | POA: Diagnosis not present

## 2011-04-22 DIAGNOSIS — E785 Hyperlipidemia, unspecified: Secondary | ICD-10-CM | POA: Diagnosis not present

## 2011-04-22 DIAGNOSIS — I10 Essential (primary) hypertension: Secondary | ICD-10-CM

## 2011-04-22 LAB — CBC WITH DIFFERENTIAL/PLATELET
Basophils Relative: 1.3 % (ref 0.0–3.0)
Eosinophils Relative: 2.9 % (ref 0.0–5.0)
HCT: 43.2 % (ref 39.0–52.0)
Lymphs Abs: 2.8 10*3/uL (ref 0.7–4.0)
MCV: 90.1 fl (ref 78.0–100.0)
Monocytes Absolute: 0.3 10*3/uL (ref 0.1–1.0)
Neutrophils Relative %: 49.4 % (ref 43.0–77.0)
RBC: 4.79 Mil/uL (ref 4.22–5.81)
WBC: 6.6 10*3/uL (ref 4.5–10.5)

## 2011-04-22 LAB — COMPREHENSIVE METABOLIC PANEL
AST: 21 U/L (ref 0–37)
Albumin: 4.2 g/dL (ref 3.5–5.2)
Alkaline Phosphatase: 73 U/L (ref 39–117)
BUN: 14 mg/dL (ref 6–23)
Potassium: 4.3 mEq/L (ref 3.5–5.1)

## 2011-04-22 LAB — LIPID PANEL
LDL Cholesterol: 70 mg/dL (ref 0–99)
Total CHOL/HDL Ratio: 2
Triglycerides: 151 mg/dL — ABNORMAL HIGH (ref 0.0–149.0)

## 2011-04-22 NOTE — Assessment & Plan Note (Signed)
He remains seizure free, I will check his Pb level today

## 2011-04-22 NOTE — Assessment & Plan Note (Signed)
He is doing well on lipitor, I will check his FLP today 

## 2011-04-22 NOTE — Assessment & Plan Note (Signed)
His BP is well controlled, I will check his lytes and renal function today 

## 2011-04-22 NOTE — Patient Instructions (Signed)

## 2011-04-22 NOTE — Progress Notes (Signed)
Subjective:    Patient ID: William Mills, male    DOB: 1934-12-18, 77 y.o.   MRN: [id number]  Hypertension This is a chronic problem. The current episode started more than 1 year ago. The problem has been gradually improving since onset. The problem is controlled. Pertinent negatives include no anxiety, blurred vision, chest pain, headaches, malaise/fatigue, neck pain, orthopnea, palpitations, peripheral edema, PND, shortness of breath or sweats. Past treatments include calcium channel blockers, angiotensin blockers and diuretics. The current treatment provides significant improvement. Compliance problems include exercise and diet.  There is no history of chronic renal disease.  Hyperlipidemia This is a chronic problem. The current episode started more than 1 year ago. The problem is controlled. Recent lipid tests were reviewed and are variable. He has no history of chronic renal disease, diabetes, hypothyroidism, liver disease, obesity or nephrotic syndrome. Factors aggravating his hyperlipidemia include no known factors. Pertinent negatives include no chest pain, focal sensory loss, focal weakness, leg pain, myalgias or shortness of breath. Current antihyperlipidemic treatment includes statins. The current treatment provides moderate improvement of lipids. Compliance problems include adherence to exercise and adherence to diet.       Review of Systems  Constitutional: Negative for fever, chills, malaise/fatigue, diaphoresis, activity change, appetite change, fatigue and unexpected weight change.  HENT: Negative.  Negative for neck pain.   Eyes: Negative.  Negative for blurred vision.  Respiratory: Negative for apnea, cough, choking, chest tightness, shortness of breath, wheezing and stridor.   Cardiovascular: Negative for chest pain, palpitations, orthopnea, leg swelling and PND.  Gastrointestinal: Negative for nausea, vomiting, abdominal pain, diarrhea and constipation.  Genitourinary:  Negative.   Musculoskeletal: Negative for myalgias, back pain, joint swelling, arthralgias and gait problem.  Skin: Negative for color change, pallor, rash and wound.  Neurological: Negative for dizziness, tremors, focal weakness, seizures, syncope, facial asymmetry, speech difficulty, weakness, light-headedness, numbness and headaches.  Hematological: Negative for adenopathy. Does not bruise/bleed easily.  Psychiatric/Behavioral: Positive for decreased concentration. Negative for suicidal ideas, hallucinations, behavioral problems, confusion, sleep disturbance, self-injury, dysphoric mood and agitation. The patient is not nervous/anxious and is not hyperactive.        Objective:   Physical Exam  Vitals reviewed. Constitutional: He is oriented to person, place, and time. He appears well-developed and well-nourished. No distress.  HENT:  Head: Normocephalic and atraumatic.  Mouth/Throat: Oropharynx is clear and moist. No oropharyngeal exudate.  Eyes: Conjunctivae are normal. Right eye exhibits no discharge. Left eye exhibits no discharge. No scleral icterus.  Neck: Normal range of motion. Neck supple. No JVD present. No tracheal deviation present. No thyromegaly present.  Cardiovascular: Normal rate, regular rhythm, normal heart sounds and intact distal pulses.  Exam reveals no gallop and no friction rub.   No murmur heard. Pulmonary/Chest: Effort normal and breath sounds normal. No stridor. No respiratory distress. He has no wheezes. He has no rales. He exhibits no tenderness.  Abdominal: Soft. Bowel sounds are normal. He exhibits no distension and no mass. There is no tenderness. There is no rebound and no guarding.  Musculoskeletal: Normal range of motion. He exhibits no edema and no tenderness.  Lymphadenopathy:    He has no cervical adenopathy.  Neurological: He is oriented to person, place, and time.  Skin: Skin is warm and dry. No rash noted. He is not diaphoretic. No erythema. No  pallor.  Psychiatric: He has a normal mood and affect. Thought content normal. His mood appears not anxious. His affect is not angry, not blunt, not  labile and not inappropriate. His speech is tangential. His speech is not rapid and/or pressured, not delayed and not slurred. He is slowed. He is not agitated, not aggressive, is not hyperactive, not withdrawn, not actively hallucinating and not combative. Thought content is not paranoid and not delusional. Cognition and memory are not impaired. He does not express impulsivity or inappropriate judgment. He does not exhibit a depressed mood. He expresses no homicidal and no suicidal ideation. He expresses no suicidal plans and no homicidal plans. He is communicative. He exhibits normal recent memory and normal remote memory. He is inattentive.      Lab Results  Component Value Date   WBC 6.6 12/10/2010   HGB 13.7 12/10/2010   HCT 40.3 12/10/2010   PLT 243.0 12/10/2010   GLUCOSE 96 12/10/2010   CHOL 162 07/17/2010   TRIG 149.0 07/17/2010   HDL 66.60 07/17/2010   LDLCALC 66 07/17/2010   ALT 35 12/10/2010   AST 27 12/10/2010   NA 140 12/10/2010   K 4.1 12/10/2010   CL 101 12/10/2010   CREATININE 0.9 12/10/2010   BUN 13 12/10/2010   CO2 32 12/10/2010   TSH 2.57 07/17/2010   PSA 1.36 11/21/2009      Assessment & Plan:

## 2011-04-30 DIAGNOSIS — M79609 Pain in unspecified limb: Secondary | ICD-10-CM | POA: Diagnosis not present

## 2011-04-30 DIAGNOSIS — M2040 Other hammer toe(s) (acquired), unspecified foot: Secondary | ICD-10-CM | POA: Diagnosis not present

## 2011-06-02 ENCOUNTER — Other Ambulatory Visit: Payer: Self-pay | Admitting: Internal Medicine

## 2011-06-27 ENCOUNTER — Other Ambulatory Visit: Payer: Self-pay

## 2011-06-27 DIAGNOSIS — R569 Unspecified convulsions: Secondary | ICD-10-CM

## 2011-06-27 MED ORDER — PHENOBARBITAL 97.2 MG PO TABS: 97.2000 mg | ORAL_TABLET | Freq: Every day | ORAL | Status: DC

## 2011-06-27 NOTE — Telephone Encounter (Signed)
Received walk in sheet from pt requesting med refill on phenobarbital. Patient last seen 04/22/11, please advise if ok to refill Thanks

## 2011-07-02 ENCOUNTER — Other Ambulatory Visit: Payer: Self-pay | Admitting: Internal Medicine

## 2011-07-02 MED ORDER — PHENOBARBITAL 97.2 MG PO TABS: 97.2000 mg | ORAL_TABLET | Freq: Every day | ORAL | Status: DC

## 2011-07-02 NOTE — Telephone Encounter (Signed)
Addended by: Rock Nephew T on: 07/02/2011 08:11 AM   Modules accepted: Orders

## 2011-07-30 DIAGNOSIS — M2040 Other hammer toe(s) (acquired), unspecified foot: Secondary | ICD-10-CM | POA: Diagnosis not present

## 2011-08-06 DIAGNOSIS — H2520 Age-related cataract, morgagnian type, unspecified eye: Secondary | ICD-10-CM | POA: Diagnosis not present

## 2011-08-06 DIAGNOSIS — H401390 Pigmentary glaucoma, unspecified eye, stage unspecified: Secondary | ICD-10-CM | POA: Diagnosis not present

## 2011-08-15 ENCOUNTER — Other Ambulatory Visit: Payer: Self-pay | Admitting: Internal Medicine

## 2011-08-21 ENCOUNTER — Other Ambulatory Visit (INDEPENDENT_AMBULATORY_CARE_PROVIDER_SITE_OTHER): Payer: Medicare Other

## 2011-08-21 ENCOUNTER — Encounter: Payer: Self-pay | Admitting: Internal Medicine

## 2011-08-21 ENCOUNTER — Ambulatory Visit (INDEPENDENT_AMBULATORY_CARE_PROVIDER_SITE_OTHER): Payer: Medicare Other | Admitting: Internal Medicine

## 2011-08-21 VITALS — BP 114/68 | HR 62 | Temp 97.5°F | Resp 16 | Wt 220.0 lb

## 2011-08-21 DIAGNOSIS — R7309 Other abnormal glucose: Secondary | ICD-10-CM | POA: Diagnosis not present

## 2011-08-21 DIAGNOSIS — I10 Essential (primary) hypertension: Secondary | ICD-10-CM

## 2011-08-21 DIAGNOSIS — Z23 Encounter for immunization: Secondary | ICD-10-CM | POA: Diagnosis not present

## 2011-08-21 DIAGNOSIS — R739 Hyperglycemia, unspecified: Secondary | ICD-10-CM | POA: Insufficient documentation

## 2011-08-21 DIAGNOSIS — Z79899 Other long term (current) drug therapy: Secondary | ICD-10-CM

## 2011-08-21 DIAGNOSIS — R569 Unspecified convulsions: Secondary | ICD-10-CM

## 2011-08-21 LAB — COMPREHENSIVE METABOLIC PANEL
Albumin: 4.1 g/dL (ref 3.5–5.2)
CO2: 30 mEq/L (ref 19–32)
GFR: 86.33 mL/min (ref >60.00)
Glucose, Bld: 130 mg/dL — ABNORMAL HIGH (ref 70–99)
Sodium: 141 mEq/L (ref 135–145)
Total Bilirubin: 0.5 mg/dL (ref 0.3–1.2)
Total Protein: 7.7 g/dL (ref 6.0–8.3)

## 2011-08-21 LAB — CBC WITH DIFFERENTIAL/PLATELET
Eosinophils Relative: 3 % (ref 0.0–5.0)
HCT: 42.2 % (ref 39.0–52.0)
Hemoglobin: 13.9 g/dL (ref 13.0–17.0)
Lymphs Abs: 3.1 10*3/uL (ref 0.7–4.0)
Monocytes Relative: 4.2 % (ref 3.0–12.0)
Platelets: 240 10*3/uL (ref 150.0–400.0)
RBC: 4.64 Mil/uL (ref 4.22–5.81)
WBC: 7.2 10*3/uL (ref 4.5–10.5)

## 2011-08-21 LAB — HEMOGLOBIN A1C: Hgb A1c MFr Bld: 5.8 % (ref 4.6–6.5)

## 2011-08-21 NOTE — Assessment & Plan Note (Signed)
He reports no seizures and he has no s/s suggestive of Pb toxicity, I will check his labs today to look for toxicity and organ impairment

## 2011-08-21 NOTE — Assessment & Plan Note (Signed)
I will check his a1c to see if he has developed DM II 

## 2011-08-21 NOTE — Assessment & Plan Note (Signed)
His BP is well controlled, I will check his lytes and renal function 

## 2011-08-21 NOTE — Assessment & Plan Note (Signed)
I will check his labs today 

## 2011-08-21 NOTE — Patient Instructions (Signed)

## 2011-08-21 NOTE — Progress Notes (Signed)
  Subjective:    Patient ID: William Mills, male    DOB: 1935/01/22, 77 y.o.   MRN: [id number]  Hypertension This is a chronic problem. The current episode started more than 1 year ago. The problem has been gradually improving since onset. The problem is controlled. Pertinent negatives include no anxiety, blurred vision, chest pain, headaches, malaise/fatigue, neck pain, orthopnea, palpitations, peripheral edema, PND, shortness of breath or sweats. Past treatments include calcium channel blockers, angiotensin blockers and diuretics. The current treatment provides significant improvement. There are no compliance problems.       Review of Systems  Constitutional: Negative.  Negative for malaise/fatigue.  HENT: Negative.  Negative for neck pain.   Eyes: Negative.  Negative for blurred vision.  Respiratory: Negative.  Negative for cough, chest tightness, shortness of breath, wheezing and stridor.   Cardiovascular: Negative for chest pain, palpitations, orthopnea and PND.  Gastrointestinal: Negative.   Genitourinary: Negative.   Musculoskeletal: Negative.   Skin: Negative.   Neurological: Negative for dizziness, tremors, seizures, syncope, facial asymmetry, speech difficulty, weakness, light-headedness, numbness and headaches.  Hematological: Negative.   Psychiatric/Behavioral: Negative.        Objective:   Physical Exam  Vitals reviewed. Constitutional: He is oriented to person, place, and time. He appears well-developed and well-nourished. No distress.  HENT:  Head: Normocephalic and atraumatic.  Mouth/Throat: Oropharynx is clear and moist. No oropharyngeal exudate.  Eyes: Conjunctivae are normal. Right eye exhibits no discharge. Left eye exhibits no discharge. No scleral icterus.  Neck: Normal range of motion. Neck supple. No JVD present. No tracheal deviation present. No thyromegaly present.  Cardiovascular: Normal rate, regular rhythm, normal heart sounds and intact distal pulses.   Exam reveals no gallop and no friction rub.   No murmur heard. Pulmonary/Chest: Effort normal and breath sounds normal. No stridor. No respiratory distress. He has no wheezes. He has no rales. He exhibits no tenderness.  Abdominal: Soft. Bowel sounds are normal. He exhibits no distension. There is no tenderness. There is no rebound and no guarding.  Musculoskeletal: Normal range of motion. He exhibits no edema and no tenderness.  Lymphadenopathy:    He has no cervical adenopathy.  Neurological: He is oriented to person, place, and time.  Skin: Skin is warm and dry. No rash noted. He is not diaphoretic. No erythema. No pallor.  Psychiatric: He has a normal mood and affect. His behavior is normal. Judgment and thought content normal.      Lab Results  Component Value Date   WBC 6.6 04/22/2011   HGB 14.4 04/22/2011   HCT 43.2 04/22/2011   PLT 230.0 04/22/2011   GLUCOSE 101* 04/22/2011   CHOL 182 04/22/2011   TRIG 151.0* 04/22/2011   HDL 81.90 04/22/2011   LDLCALC 70 04/22/2011   ALT 28 04/22/2011   AST 21 04/22/2011   NA 142 04/22/2011   K 4.3 04/22/2011   CL 106 04/22/2011   CREATININE 0.9 04/22/2011   BUN 14 04/22/2011   CO2 32 04/22/2011   TSH 2.57 04/22/2011   PSA 1.36 11/21/2009     Assessment & Plan:

## 2011-12-04 ENCOUNTER — Other Ambulatory Visit: Payer: Self-pay | Admitting: *Deleted

## 2011-12-04 MED ORDER — NIFEDIPINE ER OSMOTIC RELEASE 60 MG PO TB24: 60.0000 mg | ORAL_TABLET | Freq: Every day | ORAL | Status: DC

## 2011-12-17 ENCOUNTER — Other Ambulatory Visit: Payer: Self-pay

## 2011-12-17 MED ORDER — VALSARTAN-HYDROCHLOROTHIAZIDE 160-12.5 MG PO TABS: 1.0000 | ORAL_TABLET | Freq: Every day | ORAL | Status: DC

## 2012-01-09 ENCOUNTER — Other Ambulatory Visit: Payer: Self-pay | Admitting: Internal Medicine

## 2012-01-20 DIAGNOSIS — H2510 Age-related nuclear cataract, unspecified eye: Secondary | ICD-10-CM | POA: Diagnosis not present

## 2012-01-20 DIAGNOSIS — H401390 Pigmentary glaucoma, unspecified eye, stage unspecified: Secondary | ICD-10-CM | POA: Diagnosis not present

## 2012-02-11 ENCOUNTER — Other Ambulatory Visit: Payer: Self-pay | Admitting: Internal Medicine

## 2012-02-14 NOTE — Telephone Encounter (Signed)
MD out of office. Is this ok to refill.../lmb 

## 2012-02-14 NOTE — Telephone Encounter (Signed)
Rx faxed back to [facility].../lmb 

## 2012-03-13 ENCOUNTER — Other Ambulatory Visit: Payer: Self-pay | Admitting: *Deleted

## 2012-03-13 MED ORDER — PHENOBARBITAL 97.2 MG PO TABS: 97.2000 mg | ORAL_TABLET | Freq: Every day | ORAL | Status: DC

## 2012-03-13 NOTE — Telephone Encounter (Signed)
Rx faxed to [facility].  

## 2012-03-13 NOTE — Telephone Encounter (Signed)
R'cd fax from [facility] for refill of Phenobarbital-last written 02/11/2012 #30 with 0 refills.

## 2012-06-17 ENCOUNTER — Other Ambulatory Visit: Payer: Self-pay | Admitting: Internal Medicine

## 2012-06-18 ENCOUNTER — Telehealth: Payer: Self-pay | Admitting: Internal Medicine

## 2012-06-18 MED ORDER — PHENOBARBITAL 97.2 MG PO TABS: 97.2000 mg | ORAL_TABLET | Freq: Every day | ORAL | Status: DC

## 2012-06-18 NOTE — Telephone Encounter (Signed)
Pt has already left the office, forwarding message to get  Scheduled.

## 2012-06-18 NOTE — Telephone Encounter (Signed)
Request refill on PHENobarbital (LUMINAL) 97.2 MG, pt is waiting in lobby, needs written tx

## 2012-06-18 NOTE — Telephone Encounter (Signed)
Bring him back to be seen

## 2012-06-18 NOTE — Telephone Encounter (Signed)
Pt contacted daughter and please send to  [facility] on [address] Ph [phone]

## 2012-06-19 ENCOUNTER — Other Ambulatory Visit: Payer: Self-pay | Admitting: Internal Medicine

## 2012-06-19 ENCOUNTER — Telehealth: Payer: Self-pay | Admitting: Internal Medicine

## 2012-06-19 ENCOUNTER — Other Ambulatory Visit: Payer: Self-pay

## 2012-06-19 MED ORDER — PHENOBARBITAL 97.2 MG PO TABS: ORAL_TABLET | ORAL | Status: DC

## 2012-06-19 NOTE — Telephone Encounter (Signed)
Daughter is calling regarding a refill of Phenobarbital.  Pt came to the office to get the RX and then left requesting refill to be sent to [address] [phone]).  RN could see the new RX in EPIC and I tried to call in the RX but the pharmacist needs Dr Yetta Barre DEA number and we do not have that number on file.  OFFICE PLEASE CALL IN THE RX FOR PT.  PT IS COMPLETELY OUT OF THIS MEDICATION

## 2012-06-19 NOTE — Telephone Encounter (Signed)
Per MD, pt requires an OV. Please see previous phone note

## 2012-06-25 DIAGNOSIS — H401390 Pigmentary glaucoma, unspecified eye, stage unspecified: Secondary | ICD-10-CM | POA: Diagnosis not present

## 2012-07-11 ENCOUNTER — Other Ambulatory Visit: Payer: Self-pay | Admitting: Internal Medicine

## 2012-07-16 DIAGNOSIS — H2510 Age-related nuclear cataract, unspecified eye: Secondary | ICD-10-CM | POA: Diagnosis not present

## 2012-07-16 DIAGNOSIS — H401390 Pigmentary glaucoma, unspecified eye, stage unspecified: Secondary | ICD-10-CM | POA: Diagnosis not present

## 2012-07-16 DIAGNOSIS — H2520 Age-related cataract, morgagnian type, unspecified eye: Secondary | ICD-10-CM | POA: Diagnosis not present

## 2012-07-22 DIAGNOSIS — H401390 Pigmentary glaucoma, unspecified eye, stage unspecified: Secondary | ICD-10-CM | POA: Diagnosis not present

## 2012-07-22 DIAGNOSIS — H2510 Age-related nuclear cataract, unspecified eye: Secondary | ICD-10-CM | POA: Diagnosis not present

## 2012-07-22 DIAGNOSIS — H2520 Age-related cataract, morgagnian type, unspecified eye: Secondary | ICD-10-CM | POA: Diagnosis not present

## 2012-08-13 ENCOUNTER — Other Ambulatory Visit: Payer: Self-pay | Admitting: Internal Medicine

## 2012-08-13 NOTE — Telephone Encounter (Signed)
Called to [facility] with 5 refills, per Dr. Yetta Barre.

## 2012-09-04 ENCOUNTER — Other Ambulatory Visit: Payer: Self-pay | Admitting: Internal Medicine

## 2012-09-10 DIAGNOSIS — H04129 Dry eye syndrome of unspecified lacrimal gland: Secondary | ICD-10-CM | POA: Diagnosis not present

## 2012-09-10 DIAGNOSIS — H2510 Age-related nuclear cataract, unspecified eye: Secondary | ICD-10-CM | POA: Diagnosis not present

## 2012-09-10 DIAGNOSIS — H401390 Pigmentary glaucoma, unspecified eye, stage unspecified: Secondary | ICD-10-CM | POA: Diagnosis not present

## 2012-11-26 ENCOUNTER — Other Ambulatory Visit: Payer: Self-pay | Admitting: Internal Medicine

## 2012-12-03 ENCOUNTER — Other Ambulatory Visit: Payer: Self-pay | Admitting: Internal Medicine

## 2012-12-11 ENCOUNTER — Telehealth: Payer: Self-pay | Admitting: Internal Medicine

## 2012-12-11 NOTE — Telephone Encounter (Signed)
It looks like this has been discontinued 

## 2012-12-11 NOTE — Telephone Encounter (Signed)
Pt stated he went to pharmacy to get NIFEdipine [facility]) And was told there are no more refills, patient came in to schedule an appointment for his physical on 12/24/12 Pt wants to no if he can be provided with more med bc he is almost out  Please advise if this can be done

## 2012-12-11 NOTE — Telephone Encounter (Signed)
Can anything similar be provided or what should i tell this patient

## 2012-12-11 NOTE — Telephone Encounter (Signed)
Pt stated he went to pharmacy to get

## 2012-12-24 ENCOUNTER — Encounter: Payer: Self-pay | Admitting: Internal Medicine

## 2012-12-24 ENCOUNTER — Other Ambulatory Visit (INDEPENDENT_AMBULATORY_CARE_PROVIDER_SITE_OTHER): Payer: Medicare Other

## 2012-12-24 ENCOUNTER — Ambulatory Visit (INDEPENDENT_AMBULATORY_CARE_PROVIDER_SITE_OTHER): Payer: Medicare Other | Admitting: Internal Medicine

## 2012-12-24 VITALS — BP 152/80 | HR 58 | Temp 97.8°F | Resp 16 | Ht 74.0 in | Wt 209.2 lb

## 2012-12-24 DIAGNOSIS — E785 Hyperlipidemia, unspecified: Secondary | ICD-10-CM

## 2012-12-24 DIAGNOSIS — Z0000 Encounter for general adult medical examination without abnormal findings: Secondary | ICD-10-CM | POA: Insufficient documentation

## 2012-12-24 DIAGNOSIS — R7309 Other abnormal glucose: Secondary | ICD-10-CM

## 2012-12-24 DIAGNOSIS — F068 Other specified mental disorders due to known physiological condition: Secondary | ICD-10-CM

## 2012-12-24 DIAGNOSIS — Z23 Encounter for immunization: Secondary | ICD-10-CM

## 2012-12-24 DIAGNOSIS — I10 Essential (primary) hypertension: Secondary | ICD-10-CM | POA: Diagnosis not present

## 2012-12-24 DIAGNOSIS — R569 Unspecified convulsions: Secondary | ICD-10-CM

## 2012-12-24 LAB — COMPREHENSIVE METABOLIC PANEL
ALT: 21 U/L (ref 0–53)
AST: 21 U/L (ref 0–37)
Albumin: 4.2 g/dL (ref 3.5–5.2)
CO2: 30 mEq/L (ref 19–32)
Calcium: 9.5 mg/dL (ref 8.4–10.5)
Chloride: 101 mEq/L (ref 96–112)
Creatinine, Ser: 0.8 mg/dL (ref 0.4–1.5)
GFR: 104.31 mL/min (ref >60.00)
Potassium: 4.1 mEq/L (ref 3.5–5.1)

## 2012-12-24 LAB — CBC WITH DIFFERENTIAL/PLATELET
Basophils Absolute: 0.1 10*3/uL (ref 0.0–0.1)
Basophils Relative: 0.9 % (ref 0.0–3.0)
Eosinophils Absolute: 0.1 10*3/uL (ref 0.0–0.7)
Lymphocytes Relative: 44.8 % (ref 12.0–46.0)
MCHC: 33.5 g/dL (ref 30.0–36.0)
Monocytes Relative: 4.3 % (ref 3.0–12.0)
Neutrophils Relative %: 47.7 % (ref 43.0–77.0)
Platelets: 211 10*3/uL (ref 150.0–400.0)
RBC: 4.57 Mil/uL (ref 4.22–5.81)
RDW: 14.4 % (ref 11.5–14.6)
WBC: 6.3 10*3/uL (ref 4.5–10.5)

## 2012-12-24 LAB — LIPID PANEL
Cholesterol: 172 mg/dL (ref 0–200)
LDL Cholesterol: 69 mg/dL (ref 0–99)
Total CHOL/HDL Ratio: 2
Triglycerides: 122 mg/dL (ref 0.0–149.0)

## 2012-12-24 LAB — TSH: TSH: 2.07 u[IU]/mL (ref 0.35–5.50)

## 2012-12-24 LAB — HEMOGLOBIN A1C: Hgb A1c MFr Bld: 5.9 % (ref 4.6–6.5)

## 2012-12-24 MED ORDER — NIFEDIPINE ER OSMOTIC RELEASE 60 MG PO TB24: 60.0000 mg | ORAL_TABLET | Freq: Every day | ORAL | Status: DC

## 2012-12-24 NOTE — Progress Notes (Signed)
Pre visit review using our clinic review tool, if applicable. No additional management support is needed unless otherwise documented below in the visit note. 

## 2012-12-24 NOTE — Patient Instructions (Signed)

## 2012-12-24 NOTE — Assessment & Plan Note (Signed)
He reports no recent seizure activity I will check his Pb level today

## 2012-12-24 NOTE — Assessment & Plan Note (Addendum)

## 2012-12-24 NOTE — Assessment & Plan Note (Signed)
FLP today 

## 2012-12-24 NOTE — Assessment & Plan Note (Signed)
I don't think there is a med that would help with this

## 2012-12-24 NOTE — Assessment & Plan Note (Signed)
I will check his A1C to see if he has developed DM2 

## 2012-12-24 NOTE — Progress Notes (Signed)
Subjective:    Patient ID: William Mills, male    DOB: 1934-12-13, 78 y.o.   MRN: [id number]  Hypertension This is a chronic problem. The current episode started more than 1 year ago. The problem is unchanged. The problem is controlled. Pertinent negatives include no anxiety, blurred vision, chest pain, headaches, malaise/fatigue, neck pain, orthopnea, palpitations, peripheral edema, PND, shortness of breath or sweats. There are no associated agents to hypertension. Past treatments include angiotensin blockers, calcium channel blockers and diuretics. The current treatment provides moderate improvement. There are no compliance problems.       Review of Systems  Constitutional: Negative.  Negative for fever, chills, malaise/fatigue, diaphoresis, activity change, appetite change, fatigue and unexpected weight change.  HENT: Negative.   Eyes: Negative.  Negative for blurred vision.  Respiratory: Negative.  Negative for cough, choking, chest tightness, shortness of breath, wheezing and stridor.   Cardiovascular: Negative.  Negative for chest pain, palpitations, orthopnea, leg swelling and PND.  Gastrointestinal: Negative.  Negative for nausea, abdominal pain, diarrhea, constipation and blood in stool.  Endocrine: Negative.   Genitourinary: Negative.  Negative for dysuria, urgency, frequency, hematuria, flank pain, decreased urine volume, enuresis, difficulty urinating and testicular pain.  Musculoskeletal: Negative.  Negative for neck pain.  Skin: Negative.   Neurological: Negative.  Negative for dizziness, syncope, speech difficulty, weakness, light-headedness, numbness and headaches.  Hematological: Negative.  Negative for adenopathy. Does not bruise/bleed easily.  Psychiatric/Behavioral: Positive for confusion and decreased concentration. Negative for suicidal ideas, hallucinations, behavioral problems, sleep disturbance, self-injury, dysphoric mood and agitation. The patient is not  nervous/anxious and is not hyperactive.        Objective:   Physical Exam  Vitals reviewed. Constitutional: He is oriented to person, place, and time. He appears well-developed and well-nourished. No distress.  HENT:  Head: Normocephalic and atraumatic.  Mouth/Throat: Oropharynx is clear and moist. No oropharyngeal exudate.  Eyes: Conjunctivae are normal. Right eye exhibits no discharge. Left eye exhibits no discharge. No scleral icterus.  Neck: Normal range of motion. Neck supple. No JVD present. No tracheal deviation present. No thyromegaly present.  Cardiovascular: Normal rate, regular rhythm, normal heart sounds and intact distal pulses.  Exam reveals no gallop and no friction rub.   No murmur heard. Pulmonary/Chest: Effort normal and breath sounds normal. No stridor. No respiratory distress. He has no wheezes. He has no rales. He exhibits no tenderness.  Abdominal: Soft. Bowel sounds are normal. He exhibits no distension and no mass. There is no tenderness. There is no rebound and no guarding. Hernia confirmed negative in the right inguinal area and confirmed negative in the left inguinal area.  Genitourinary: Rectum normal, testes normal and penis normal. Rectal exam shows no external hemorrhoid, no internal hemorrhoid, no fissure, no mass, no tenderness and anal tone normal. Guaiac negative stool. Prostate is enlarged (2+ smooth symm BPH). Prostate is not tender. Right testis shows no mass, no swelling and no tenderness. Right testis is descended. Left testis shows no mass, no swelling and no tenderness. Left testis is descended. Circumcised. No penile erythema or penile tenderness. No discharge found.  Musculoskeletal: Normal range of motion. He exhibits no edema and no tenderness.  Lymphadenopathy:    He has no cervical adenopathy.       Right: No inguinal adenopathy present.       Left: No inguinal adenopathy present.  Neurological: He is alert and oriented to person, place, and  time. He has normal reflexes. He displays normal reflexes. No cranial  nerve deficit. He exhibits normal muscle tone. Coordination normal.  Skin: Skin is warm and dry. No rash noted. He is not diaphoretic. No erythema. No pallor.  Psychiatric: He has a normal mood and affect. Judgment and thought content normal. His mood appears not anxious. His affect is not angry, not blunt, not labile and not inappropriate. His speech is delayed. His speech is not rapid and/or pressured, not tangential and not slurred. He is slowed and withdrawn. He is not agitated, not aggressive, not hyperactive, not actively hallucinating and not combative. Cognition and memory are normal. He does not exhibit a depressed mood. He is communicative. He is inattentive.     Lab Results  Component Value Date   WBC 7.2 08/21/2011   HGB 13.9 08/21/2011   HCT 42.2 08/21/2011   PLT 240.0 08/21/2011   GLUCOSE 130* 08/21/2011   CHOL 182 04/22/2011   TRIG 151.0* 04/22/2011   HDL 81.90 04/22/2011   LDLCALC 70 04/22/2011   ALT 35 08/21/2011   AST 23 08/21/2011   NA 141 08/21/2011   K 4.4 08/21/2011   CL 104 08/21/2011   CREATININE 0.9 08/21/2011   BUN 22 08/21/2011   CO2 30 08/21/2011   TSH 2.57 04/22/2011   PSA 1.36 11/21/2009   HGBA1C 5.8 08/21/2011       Assessment & Plan:

## 2012-12-24 NOTE — Assessment & Plan Note (Signed)
His BP is well controlled Today I will check his lytes and renal function 

## 2012-12-25 LAB — PHENOBARBITAL LEVEL: Phenobarbital: 26.8 ug/mL (ref 15.0–40.0)

## 2013-01-07 ENCOUNTER — Other Ambulatory Visit: Payer: Self-pay | Admitting: Internal Medicine

## 2013-02-16 ENCOUNTER — Other Ambulatory Visit: Payer: Self-pay | Admitting: Internal Medicine

## 2013-04-09 ENCOUNTER — Other Ambulatory Visit: Payer: Self-pay | Admitting: Internal Medicine

## 2013-04-20 DIAGNOSIS — H401490 Capsular glaucoma with pseudoexfoliation of lens, unspecified eye, stage unspecified: Secondary | ICD-10-CM | POA: Diagnosis not present

## 2013-04-20 DIAGNOSIS — IMO0002 Reserved for concepts with insufficient information to code with codable children: Secondary | ICD-10-CM | POA: Diagnosis not present

## 2013-07-20 ENCOUNTER — Encounter: Payer: Self-pay | Admitting: Internal Medicine

## 2013-07-20 ENCOUNTER — Other Ambulatory Visit (INDEPENDENT_AMBULATORY_CARE_PROVIDER_SITE_OTHER): Payer: Medicare Other

## 2013-07-20 ENCOUNTER — Ambulatory Visit (INDEPENDENT_AMBULATORY_CARE_PROVIDER_SITE_OTHER): Payer: Medicare Other | Admitting: Internal Medicine

## 2013-07-20 VITALS — BP 130/72 | HR 55 | Temp 98.2°F | Resp 16 | Ht 74.0 in | Wt 212.0 lb

## 2013-07-20 DIAGNOSIS — Z79899 Other long term (current) drug therapy: Secondary | ICD-10-CM

## 2013-07-20 DIAGNOSIS — R569 Unspecified convulsions: Secondary | ICD-10-CM | POA: Diagnosis not present

## 2013-07-20 DIAGNOSIS — Z23 Encounter for immunization: Secondary | ICD-10-CM | POA: Diagnosis not present

## 2013-07-20 DIAGNOSIS — I10 Essential (primary) hypertension: Secondary | ICD-10-CM

## 2013-07-20 LAB — COMPREHENSIVE METABOLIC PANEL
ALT: 20 U/L (ref 0–53)
AST: 19 U/L (ref 0–37)
Albumin: 3.8 g/dL (ref 3.5–5.2)
Alkaline Phosphatase: 62 U/L (ref 39–117)
BILIRUBIN TOTAL: 0.5 mg/dL (ref 0.2–1.2)
BUN: 11 mg/dL (ref 6–23)
CO2: 29 mEq/L (ref 19–32)
CREATININE: 0.8 mg/dL (ref 0.4–1.5)
Calcium: 9.3 mg/dL (ref 8.4–10.5)
Chloride: 101 mEq/L (ref 96–112)
GFR: 98.24 mL/min (ref >60.00)
Glucose, Bld: 100 mg/dL — ABNORMAL HIGH (ref 70–99)
Potassium: 3.6 mEq/L (ref 3.5–5.1)
Sodium: 138 mEq/L (ref 135–145)
Total Protein: 7 g/dL (ref 6.0–8.3)

## 2013-07-20 LAB — CBC WITH DIFFERENTIAL/PLATELET
BASOS ABS: 0 10*3/uL (ref 0.0–0.1)
BASOS PCT: 0.6 % (ref 0.0–3.0)
EOS ABS: 0.1 10*3/uL (ref 0.0–0.7)
Eosinophils Relative: 2.9 % (ref 0.0–5.0)
HEMATOCRIT: 40 % (ref 39.0–52.0)
HEMOGLOBIN: 13.5 g/dL (ref 13.0–17.0)
LYMPHS ABS: 2.4 10*3/uL (ref 0.7–4.0)
Lymphocytes Relative: 48.6 % — ABNORMAL HIGH (ref 12.0–46.0)
MCHC: 33.7 g/dL (ref 30.0–36.0)
MCV: 89.1 fl (ref 78.0–100.0)
MONOS PCT: 4.5 % (ref 3.0–12.0)
Monocytes Absolute: 0.2 10*3/uL (ref 0.1–1.0)
NEUTROS ABS: 2.1 10*3/uL (ref 1.4–7.7)
Neutrophils Relative %: 43.4 % (ref 43.0–77.0)
Platelets: 212 10*3/uL (ref 150.0–400.0)
RBC: 4.49 Mil/uL (ref 4.22–5.81)
RDW: 14.3 % (ref 11.5–15.5)
WBC: 4.9 10*3/uL (ref 4.0–10.5)

## 2013-07-20 LAB — TSH: TSH: 1.78 u[IU]/mL (ref 0.35–4.50)

## 2013-07-20 NOTE — Assessment & Plan Note (Signed)
Labs today

## 2013-07-20 NOTE — Progress Notes (Signed)
Pre visit review using our clinic review tool, if applicable. No additional management support is needed unless otherwise documented below in the visit note. 

## 2013-07-20 NOTE — Assessment & Plan Note (Signed)
Will stop the CCB since he appears to have side effects related to this He will stay on the ARB and HCTZ I will monitor his lytes and renal function today

## 2013-07-20 NOTE — Patient Instructions (Signed)

## 2013-07-20 NOTE — Assessment & Plan Note (Signed)
No seizures reported He appears to be tolerating the phenobarb well I will check his levels today and will screen his labs to look for toxicity

## 2013-07-20 NOTE — Progress Notes (Signed)
   Subjective:    Patient ID: William Mills, male    DOB: 1934-08-04, 79 y.o.   MRN: [id number]  Hypertension This is a chronic problem. The current episode started more than 1 year ago. The problem is unchanged. The problem is controlled. Associated symptoms include peripheral edema. Pertinent negatives include no anxiety, blurred vision, chest pain, headaches, malaise/fatigue, neck pain, orthopnea, palpitations, PND, shortness of breath or sweats. There are no known risk factors for coronary artery disease. Past treatments include calcium channel blockers, angiotensin blockers and diuretics. The current treatment provides moderate improvement. There are no compliance problems.       Review of Systems  Constitutional: Negative.  Negative for fever, chills, malaise/fatigue, diaphoresis, appetite change and fatigue.  HENT: Negative.   Eyes: Negative.  Negative for blurred vision.  Respiratory: Negative.  Negative for cough, choking, chest tightness, shortness of breath, wheezing and stridor.   Cardiovascular: Negative.  Negative for chest pain, palpitations, orthopnea, leg swelling (bilateral ankle edema) and PND.  Gastrointestinal: Positive for constipation. Negative for nausea, vomiting, abdominal pain, diarrhea, blood in stool, abdominal distention and anal bleeding.  Endocrine: Negative.   Genitourinary: Negative.   Musculoskeletal: Negative.  Negative for arthralgias, joint swelling, myalgias, neck pain and neck stiffness.  Skin: Negative.   Allergic/Immunologic: Negative.   Neurological: Negative.  Negative for dizziness and headaches.  Hematological: Negative.  Negative for adenopathy. Does not bruise/bleed easily.  Psychiatric/Behavioral: Negative.        Objective:   Physical Exam  Vitals reviewed. Constitutional: He is oriented to person, place, and time. He appears well-developed and well-nourished. No distress.  HENT:  Head: Normocephalic and atraumatic.  Mouth/Throat:  Oropharynx is clear and moist. No oropharyngeal exudate.  Eyes: Conjunctivae are normal. Right eye exhibits no discharge. Left eye exhibits no discharge. No scleral icterus.  Neck: Normal range of motion. Neck supple. No JVD present. No tracheal deviation present. No thyromegaly present.  Cardiovascular: Normal rate, regular rhythm and intact distal pulses.  Exam reveals no gallop and no friction rub.   No murmur heard. Pulmonary/Chest: Effort normal and breath sounds normal. No stridor. No respiratory distress. He has no wheezes. He has no rales. He exhibits no tenderness.  Abdominal: Soft. Bowel sounds are normal. He exhibits no distension and no mass. There is no tenderness. There is no rebound and no guarding.  Musculoskeletal: Normal range of motion. He exhibits edema (trace edema over both ankles). He exhibits no tenderness.  Lymphadenopathy:    He has no cervical adenopathy.  Neurological: He is oriented to person, place, and time.  Skin: Skin is warm and dry. No rash noted. He is not diaphoretic. No erythema. No pallor.  Psychiatric: He has a normal mood and affect. His behavior is normal. Judgment and thought content normal.     Lab Results  Component Value Date   WBC 6.3 12/24/2012   HGB 13.7 12/24/2012   HCT 40.9 12/24/2012   PLT 211.0 12/24/2012   GLUCOSE 94 12/24/2012   CHOL 172 12/24/2012   TRIG 122.0 12/24/2012   HDL 79.00 12/24/2012   LDLCALC 69 12/24/2012   ALT 21 12/24/2012   AST 21 12/24/2012   NA 140 12/24/2012   K 4.1 12/24/2012   CL 101 12/24/2012   CREATININE 0.8 12/24/2012   BUN 13 12/24/2012   CO2 30 12/24/2012   TSH 2.07 12/24/2012   PSA 1.36 11/21/2009   HGBA1C 5.9 12/24/2012       Assessment & Plan:

## 2013-07-21 ENCOUNTER — Telehealth: Payer: Self-pay | Admitting: Internal Medicine

## 2013-07-21 NOTE — Telephone Encounter (Signed)
Relevant patient education mailed to patient.  

## 2013-07-22 LAB — PHENOBARBITAL LEVEL: Phenobarbital: 25.3 mg/L (ref 15.0–40.0)

## 2013-08-13 ENCOUNTER — Telehealth: Payer: Self-pay | Admitting: Internal Medicine

## 2013-08-13 NOTE — Telephone Encounter (Signed)
Patient Information:  Caller Name: Nanette  Phone: [phone]  Patient: William Mills, William Mills  Gender: Male  DOB: 08/30/1934  Age: [age] Years  PCP: Sanda LingerJones, [facility] (Adults only)  Office Follow Up:  Does the office need to follow up with this patient?: No  Instructions For The Office: N/A   Symptoms  Reason For Call & Symptoms: Pts daughter is calling on behalf of her DAd to let the MD know that the pt has been reporting to her that his BP has gradually been getting higher. Pt was into the office on June 2/15 and had his Nifedipine d/c . Pt was having pressures of 130/70 at that time. He measures it daily. Pt is telling his daughter his BP was been 190/90 - 180/80  160/80. Pt is not with her right now/she seems him every day.  RN asked triage questions and the daughter reported that  during this week the Dad has been speaking gibberish/nonsense to her/randomly. This is a big change.  RN advised if this was true /he would need to be seen right away. The daughter had thought but when she told him she was going to the hospital he told her he would not go and refused the ambulance. After talking to the nurse/she is going to go over to his residence and call 911 for him. if he is presenting like that. If not she will call back for advice.  Reviewed Health History In EMR: Yes  Reviewed Medications In EMR: Yes  Reviewed Allergies In EMR: Yes  Reviewed Surgeries / Procedures: Yes  Date of Onset of Symptoms: 08/09/2013  Guideline(s) Used:  High Blood Pressure  Disposition Per Guideline:   Call EMS 911 Now  Reason For Disposition Reached:   Sounds like a life-threatening emergency to the triager  Advice Given:  N/A  Patient Will Follow Care Advice:  YES

## 2013-08-19 ENCOUNTER — Ambulatory Visit (INDEPENDENT_AMBULATORY_CARE_PROVIDER_SITE_OTHER): Payer: Medicare Other | Admitting: Internal Medicine

## 2013-08-19 ENCOUNTER — Encounter: Payer: Self-pay | Admitting: Internal Medicine

## 2013-08-19 VITALS — BP 162/72 | HR 61 | Temp 98.4°F | Resp 16 | Ht 74.0 in | Wt 213.2 lb

## 2013-08-19 DIAGNOSIS — Z8679 Personal history of other diseases of the circulatory system: Secondary | ICD-10-CM

## 2013-08-19 DIAGNOSIS — IMO0002 Reserved for concepts with insufficient information to code with codable children: Secondary | ICD-10-CM

## 2013-08-19 DIAGNOSIS — R569 Unspecified convulsions: Secondary | ICD-10-CM | POA: Diagnosis not present

## 2013-08-19 DIAGNOSIS — M1710 Unilateral primary osteoarthritis, unspecified knee: Secondary | ICD-10-CM

## 2013-08-19 DIAGNOSIS — I10 Essential (primary) hypertension: Secondary | ICD-10-CM

## 2013-08-19 DIAGNOSIS — H4089 Other specified glaucoma: Secondary | ICD-10-CM

## 2013-08-19 MED ORDER — NEBIVOLOL HCL 5 MG PO TABS: 5.0000 mg | ORAL_TABLET | Freq: Every day | ORAL | Status: DC

## 2013-08-19 NOTE — Assessment & Plan Note (Signed)
Her stopped the CCB due to edema and that has resolved but now his BP is up Will add bystolic to his current regimen to get better BP control

## 2013-08-19 NOTE — Progress Notes (Signed)
Pre visit review using our clinic review tool, if applicable. No additional management support is needed unless otherwise documented below in the visit note. 

## 2013-08-19 NOTE — Progress Notes (Signed)
Subjective:    Patient ID: William Mills, male    DOB: 10/31/1934, 79 y.o.   MRN: [id number]  Hypertension This is a chronic problem. The current episode started more than 1 year ago. The problem has been gradually worsening since onset. The problem is uncontrolled. Pertinent negatives include no anxiety, blurred vision, chest pain, headaches, malaise/fatigue, neck pain, orthopnea, palpitations, peripheral edema, PND, shortness of breath or sweats. Past treatments include angiotensin blockers and diuretics. The current treatment provides moderate improvement. Compliance problems include diet and exercise.       Review of Systems  Constitutional: Negative.  Negative for fever, chills, malaise/fatigue, diaphoresis, appetite change and fatigue.  HENT: Negative.   Eyes: Negative.  Negative for blurred vision.  Respiratory: Negative.  Negative for apnea, cough, choking, chest tightness, shortness of breath, wheezing and stridor.   Cardiovascular: Negative.  Negative for chest pain, palpitations, orthopnea, leg swelling and PND.  Gastrointestinal: Negative.  Negative for nausea, vomiting, abdominal pain, diarrhea, constipation and blood in stool.  Endocrine: Negative.   Genitourinary: Negative.   Musculoskeletal: Negative.  Negative for arthralgias, back pain, myalgias and neck pain.  Skin: Negative.  Negative for rash.  Allergic/Immunologic: Negative.   Neurological: Negative.  Negative for headaches.  Hematological: Negative.  Negative for adenopathy. Does not bruise/bleed easily.  Psychiatric/Behavioral: Positive for confusion, sleep disturbance and decreased concentration. Negative for suicidal ideas, hallucinations, behavioral problems, self-injury, dysphoric mood and agitation. The patient is not nervous/anxious and is not hyperactive.        Objective:   Physical Exam  Vitals reviewed. Constitutional: He is oriented to person, place, and time. He appears well-developed and  well-nourished. No distress.  HENT:  Head: Normocephalic and atraumatic.  Mouth/Throat: Oropharynx is clear and moist. No oropharyngeal exudate.  Eyes: Conjunctivae are normal. Right eye exhibits no discharge. Left eye exhibits no discharge. No scleral icterus.  Neck: Normal range of motion. Neck supple. No JVD present. No tracheal deviation present. No thyromegaly present.  Cardiovascular: Normal rate, regular rhythm, normal heart sounds and intact distal pulses.  Exam reveals no gallop and no friction rub.   No murmur heard. Pulmonary/Chest: Effort normal and breath sounds normal. No stridor. No respiratory distress. He has no wheezes. He has no rales. He exhibits no tenderness.  Abdominal: Soft. Bowel sounds are normal. He exhibits no distension and no mass. There is no tenderness. There is no rebound and no guarding.  Musculoskeletal: Normal range of motion. He exhibits no edema and no tenderness.  Lymphadenopathy:    He has no cervical adenopathy.  Neurological: He is oriented to person, place, and time.  Skin: Skin is warm and dry. No rash noted. He is not diaphoretic. No erythema. No pallor.  Psychiatric: He has a normal mood and affect. His behavior is normal. Judgment and thought content normal.     Lab Results  Component Value Date   WBC 4.9 07/20/2013   HGB 13.5 07/20/2013   HCT 40.0 07/20/2013   PLT 212.0 07/20/2013   GLUCOSE 100* 07/20/2013   CHOL 172 12/24/2012   TRIG 122.0 12/24/2012   HDL 79.00 12/24/2012   LDLCALC 69 12/24/2012   ALT 20 07/20/2013   AST 19 07/20/2013   NA 138 07/20/2013   K 3.6 07/20/2013   CL 101 07/20/2013   CREATININE 0.8 07/20/2013   BUN 11 07/20/2013   CO2 29 07/20/2013   TSH 1.78 07/20/2013   PSA 1.36 11/21/2009   HGBA1C 5.9 12/24/2012  Assessment & Plan:

## 2013-08-19 NOTE — Patient Instructions (Signed)
Hypertension Hypertension, commonly called high blood pressure, is when the force of blood pumping through your arteries is too strong. Your arteries are the blood vessels that carry blood from your heart throughout your body. A blood pressure reading consists of a higher number over a lower number, such as 110/72. The higher number (systolic) is the pressure inside your arteries when your heart pumps. The lower number (diastolic) is the pressure inside your arteries when your heart relaxes. Ideally you want your blood pressure below 120/80. Hypertension forces your heart to work harder to pump blood. Your arteries may become narrow or stiff. Having hypertension puts you at risk for heart disease, stroke, and other problems.  RISK FACTORS Some risk factors for high blood pressure are controllable. Others are not.  Risk factors you cannot control include:   Race. You may be at higher risk if you are African American.  Age. Risk increases with age.  Gender. Men are at higher risk than women before age 45 years. After age 65, women are at higher risk than men. Risk factors you can control include:  Not getting enough exercise or physical activity.  Being overweight.  Getting too much fat, sugar, calories, or salt in your diet.  Drinking too much alcohol. SIGNS AND SYMPTOMS Hypertension does not usually cause signs or symptoms. Extremely high blood pressure (hypertensive crisis) may cause headache, anxiety, shortness of breath, and nosebleed. DIAGNOSIS  To check if you have hypertension, your health care provider will measure your blood pressure while you are seated, with your arm held at the level of your heart. It should be measured at least twice using the same arm. Certain conditions can cause a difference in blood pressure between your right and left arms. A blood pressure reading that is higher than normal on one occasion does not mean that you need treatment. If one blood pressure reading  is high, ask your health care provider about having it checked again. TREATMENT  Treating high blood pressure includes making lifestyle changes and possibly taking medication. Living a healthy lifestyle can help lower high blood pressure. You may need to change some of your habits. Lifestyle changes may include:  Following the DASH diet. This diet is high in fruits, vegetables, and whole grains. It is low in salt, red meat, and added sugars.  Getting at least 2 1/2 hours of brisk physical activity every week.  Losing weight if necessary.  Not smoking.  Limiting alcoholic beverages.  Learning ways to reduce stress. If lifestyle changes are not enough to get your blood pressure under control, your health care provider may prescribe medicine. You may need to take more than one. Work closely with your health care provider to understand the risks and benefits. HOME CARE INSTRUCTIONS  Have your blood pressure rechecked as directed by your health care provider.   Only take medicine as directed by your health care provider. Follow the directions carefully. Blood pressure medicines must be taken as prescribed. The medicine does not work as well when you skip doses. Skipping doses also puts you at risk for problems.   Do not smoke.   Monitor your blood pressure at home as directed by your health care provider. SEEK MEDICAL CARE IF:   You think you are having a reaction to medicines taken.  You have recurrent headaches or feel dizzy.  You have swelling in your ankles.  You have trouble with your vision. SEEK IMMEDIATE MEDICAL CARE IF:  You develop a severe headache or   confusion.  You have unusual weakness, numbness, or feel faint.  You have severe chest or abdominal pain.  You vomit repeatedly.  You have trouble breathing. MAKE SURE YOU:   Understand these instructions.  Will watch your condition.  Will get help right away if you are not doing well or get  worse. Document Released: 02/04/2005 Document Revised: 02/09/2013 Document Reviewed: 11/27/2012 Exit[employer] Patient Information 2015 [employer]. This information is not intended to replace advice given to you by your health care provider. Make sure you discuss any questions you have with your health care provider.  

## 2013-08-19 NOTE — Assessment & Plan Note (Signed)
He has been on Pb for many years with no recent seizures He wants to know if he can taper off of Pb so I have asked him to see neurology for further evaluation

## 2013-08-23 ENCOUNTER — Other Ambulatory Visit: Payer: Self-pay | Admitting: Internal Medicine

## 2013-09-02 ENCOUNTER — Encounter: Payer: Self-pay | Admitting: Neurology

## 2013-09-02 ENCOUNTER — Ambulatory Visit (INDEPENDENT_AMBULATORY_CARE_PROVIDER_SITE_OTHER): Payer: Medicare Other | Admitting: Neurology

## 2013-09-02 VITALS — BP 140/80 | HR 47 | Ht 74.0 in | Wt 214.9 lb

## 2013-09-02 DIAGNOSIS — Z9181 History of falling: Secondary | ICD-10-CM | POA: Diagnosis not present

## 2013-09-02 DIAGNOSIS — R269 Unspecified abnormalities of gait and mobility: Secondary | ICD-10-CM | POA: Diagnosis not present

## 2013-09-02 DIAGNOSIS — R296 Repeated falls: Secondary | ICD-10-CM

## 2013-09-02 DIAGNOSIS — R2681 Unsteadiness on feet: Secondary | ICD-10-CM

## 2013-09-02 DIAGNOSIS — R569 Unspecified convulsions: Secondary | ICD-10-CM | POA: Diagnosis not present

## 2013-09-02 NOTE — Patient Instructions (Signed)
1. Head CT without contrast 2. Routine EEG 3. Physical therapy for balance therapy 4. Continue current medications
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
## 2013-09-03 ENCOUNTER — Encounter: Payer: Self-pay | Admitting: Neurology

## 2013-09-03 ENCOUNTER — Ambulatory Visit (HOSPITAL_COMMUNITY)
Admission: RE | Admit: 2013-09-03 | Discharge: 2013-09-03 | Disposition: A | Discharge status: Home / Self Care | Payer: Medicare Other | Source: Ambulatory Visit | Attending: Neurology | Admitting: Neurology

## 2013-09-03 ENCOUNTER — Telehealth: Payer: Self-pay | Admitting: Internal Medicine

## 2013-09-03 DIAGNOSIS — R269 Unspecified abnormalities of gait and mobility: Secondary | ICD-10-CM | POA: Insufficient documentation

## 2013-09-03 DIAGNOSIS — Z9181 History of falling: Secondary | ICD-10-CM | POA: Diagnosis not present

## 2013-09-03 DIAGNOSIS — R569 Unspecified convulsions: Secondary | ICD-10-CM | POA: Insufficient documentation

## 2013-09-03 DIAGNOSIS — R296 Repeated falls: Secondary | ICD-10-CM

## 2013-09-03 DIAGNOSIS — R2681 Unsteadiness on feet: Secondary | ICD-10-CM

## 2013-09-03 DIAGNOSIS — G9389 Other specified disorders of brain: Secondary | ICD-10-CM | POA: Diagnosis not present

## 2013-09-03 NOTE — Telephone Encounter (Signed)
Michaela call from [facility] to inform our office that they going to start PA for bystolic. Henderson NewcomerMichaela stated if we need to send in anything additional to this med at [phone] ref #[id number].

## 2013-09-09 ENCOUNTER — Other Ambulatory Visit: Payer: Self-pay | Admitting: Internal Medicine

## 2013-09-10 ENCOUNTER — Ambulatory Visit: Payer: Medicare Other | Admitting: Physical Therapy

## 2013-09-10 ENCOUNTER — Ambulatory Visit: Payer: Medicare Other | Attending: Neurology

## 2013-09-10 DIAGNOSIS — F0390 Unspecified dementia without behavioral disturbance: Secondary | ICD-10-CM | POA: Insufficient documentation

## 2013-09-10 DIAGNOSIS — IMO0001 Reserved for inherently not codable concepts without codable children: Secondary | ICD-10-CM | POA: Diagnosis not present

## 2013-09-10 DIAGNOSIS — I10 Essential (primary) hypertension: Secondary | ICD-10-CM | POA: Insufficient documentation

## 2013-09-10 DIAGNOSIS — Z9181 History of falling: Secondary | ICD-10-CM | POA: Insufficient documentation

## 2013-09-10 DIAGNOSIS — H409 Unspecified glaucoma: Secondary | ICD-10-CM | POA: Diagnosis not present

## 2013-09-10 DIAGNOSIS — Z8673 Personal history of transient ischemic attack (TIA), and cerebral infarction without residual deficits: Secondary | ICD-10-CM | POA: Insufficient documentation

## 2013-09-10 DIAGNOSIS — R269 Unspecified abnormalities of gait and mobility: Secondary | ICD-10-CM | POA: Diagnosis not present

## 2013-09-10 DIAGNOSIS — M1710 Unilateral primary osteoarthritis, unspecified knee: Secondary | ICD-10-CM | POA: Insufficient documentation

## 2013-09-10 DIAGNOSIS — R569 Unspecified convulsions: Secondary | ICD-10-CM | POA: Diagnosis not present

## 2013-09-10 DIAGNOSIS — IMO0002 Reserved for concepts with insufficient information to code with codable children: Secondary | ICD-10-CM

## 2013-09-14 ENCOUNTER — Ambulatory Visit: Payer: Medicare Other | Admitting: Physical Therapy

## 2013-09-14 DIAGNOSIS — Z9181 History of falling: Secondary | ICD-10-CM | POA: Diagnosis not present

## 2013-09-14 DIAGNOSIS — R269 Unspecified abnormalities of gait and mobility: Secondary | ICD-10-CM | POA: Diagnosis not present

## 2013-09-14 DIAGNOSIS — IMO0001 Reserved for inherently not codable concepts without codable children: Secondary | ICD-10-CM | POA: Diagnosis not present

## 2013-09-14 DIAGNOSIS — Z8673 Personal history of transient ischemic attack (TIA), and cerebral infarction without residual deficits: Secondary | ICD-10-CM | POA: Diagnosis not present

## 2013-09-14 DIAGNOSIS — R569 Unspecified convulsions: Secondary | ICD-10-CM | POA: Diagnosis not present

## 2013-09-14 DIAGNOSIS — I10 Essential (primary) hypertension: Secondary | ICD-10-CM | POA: Diagnosis not present

## 2013-09-17 ENCOUNTER — Ambulatory Visit: Payer: Medicare Other | Admitting: Physical Therapy

## 2013-09-17 DIAGNOSIS — IMO0001 Reserved for inherently not codable concepts without codable children: Secondary | ICD-10-CM | POA: Diagnosis not present

## 2013-09-17 DIAGNOSIS — R269 Unspecified abnormalities of gait and mobility: Secondary | ICD-10-CM | POA: Diagnosis not present

## 2013-09-17 DIAGNOSIS — I10 Essential (primary) hypertension: Secondary | ICD-10-CM | POA: Diagnosis not present

## 2013-09-17 DIAGNOSIS — R569 Unspecified convulsions: Secondary | ICD-10-CM | POA: Diagnosis not present

## 2013-09-17 DIAGNOSIS — Z8673 Personal history of transient ischemic attack (TIA), and cerebral infarction without residual deficits: Secondary | ICD-10-CM | POA: Diagnosis not present

## 2013-09-17 DIAGNOSIS — Z9181 History of falling: Secondary | ICD-10-CM | POA: Diagnosis not present

## 2013-09-20 ENCOUNTER — Ambulatory Visit (INDEPENDENT_AMBULATORY_CARE_PROVIDER_SITE_OTHER): Payer: Medicare Other | Admitting: Neurology

## 2013-09-20 DIAGNOSIS — R569 Unspecified convulsions: Secondary | ICD-10-CM | POA: Diagnosis not present

## 2013-09-20 DIAGNOSIS — R296 Repeated falls: Secondary | ICD-10-CM

## 2013-09-20 DIAGNOSIS — R269 Unspecified abnormalities of gait and mobility: Secondary | ICD-10-CM | POA: Diagnosis not present

## 2013-09-20 DIAGNOSIS — R2681 Unsteadiness on feet: Secondary | ICD-10-CM

## 2013-09-27 NOTE — Procedures (Signed)
ELECTROENCEPHALOGRAM REPORT  Date of Study: 09/20/2013  Patient's Name: William PilaDonald Mills MRN: [id number] Date of Birth: 20-Sep-1934  Referring Provider: Dr. Patrcia DollyKaren Blaze Sandin  Clinical History: This is a 79 year old man with a history of stroke and aneurysm repair (?location) in 251991. At that time he had a seizure and was started on phenobarbital. He and his daughter deny any further seizures or seizure-like episodes, although recently he has had 2 episodes of gibberish speech in the setting of elevated BP.   Medications: Phenobarbital, Diovan, Lipitor  Technical Summary: A multichannel digital EEG recording measured by the international 10-20 system with electrodes applied with paste and impedances below 5000 ohms performed in our laboratory with EKG monitoring in an awake and asleep patient.  Hyperventilation was not performed.  Photic stimulation was performed.  The digital EEG was referentially recorded, reformatted, and digitally filtered in a variety of bipolar and referential montages for optimal display.    Description: The patient is awake and asleep during the recording.  During maximal wakefulness, there is a symmetric, medium voltage 8-8.5 Hz posterior dominant rhythm that attenuates with eye opening.  There is occasional focal 4-5 Hz theta slowing over the left mid-temporal region. Breach artifact is seen over the left frontotemporal region with higher amplitude beta activity seen in this region.  During drowsiness and sleep, there is an increase in theta slowing of the background with occasional vertex waves seen.  Photic stimulation did not elicit any abnormalities.  There were no epileptiform discharges or electrographic seizures seen.    EKG lead showed irregular rhythm.  Impression: This awake and asleep EEG is abnormal due to the presence of: 1. Occasional focal slowing over the left mid-temporal region 2. Breach artifact over the left frontotemporal region  Clinical  Correlation of the above findings indicates focal cerebral dysfunction over the left temporal region suggestive of underlying structural or physiologic abnormality.  Breach artifact is consistent with prior surgery.  The absence of epileptiform discharges does not exclude a clinical diagnosis of epilepsy.  If further clinical questions remain, prolonged EEG may be helpful.  Clinical correlation is advised.   Patrcia DollyKaren Adalai Perl, M.D.

## 2013-09-29 ENCOUNTER — Ambulatory Visit: Payer: Medicare Other | Attending: Neurology | Admitting: Physical Therapy

## 2013-09-29 DIAGNOSIS — R269 Unspecified abnormalities of gait and mobility: Secondary | ICD-10-CM | POA: Insufficient documentation

## 2013-09-29 DIAGNOSIS — H409 Unspecified glaucoma: Secondary | ICD-10-CM | POA: Insufficient documentation

## 2013-09-29 DIAGNOSIS — Z8673 Personal history of transient ischemic attack (TIA), and cerebral infarction without residual deficits: Secondary | ICD-10-CM | POA: Insufficient documentation

## 2013-09-29 DIAGNOSIS — Z9181 History of falling: Secondary | ICD-10-CM | POA: Insufficient documentation

## 2013-09-29 DIAGNOSIS — F0390 Unspecified dementia without behavioral disturbance: Secondary | ICD-10-CM | POA: Insufficient documentation

## 2013-09-29 DIAGNOSIS — M1710 Unilateral primary osteoarthritis, unspecified knee: Secondary | ICD-10-CM | POA: Diagnosis not present

## 2013-09-29 DIAGNOSIS — IMO0001 Reserved for inherently not codable concepts without codable children: Secondary | ICD-10-CM | POA: Insufficient documentation

## 2013-09-29 DIAGNOSIS — I10 Essential (primary) hypertension: Secondary | ICD-10-CM | POA: Insufficient documentation

## 2013-09-29 DIAGNOSIS — R569 Unspecified convulsions: Secondary | ICD-10-CM | POA: Insufficient documentation

## 2013-09-29 DIAGNOSIS — IMO0002 Reserved for concepts with insufficient information to code with codable children: Secondary | ICD-10-CM

## 2013-10-01 ENCOUNTER — Ambulatory Visit: Payer: Medicare Other | Admitting: Physical Therapy

## 2013-10-01 DIAGNOSIS — IMO0001 Reserved for inherently not codable concepts without codable children: Secondary | ICD-10-CM | POA: Diagnosis not present

## 2013-10-04 ENCOUNTER — Encounter: Payer: Medicare Other | Admitting: Physical Therapy

## 2013-10-06 ENCOUNTER — Ambulatory Visit: Payer: Medicare Other | Admitting: Physical Therapy

## 2013-10-06 DIAGNOSIS — IMO0001 Reserved for inherently not codable concepts without codable children: Secondary | ICD-10-CM | POA: Diagnosis not present

## 2013-10-08 ENCOUNTER — Ambulatory Visit: Payer: Medicare Other | Admitting: Physical Therapy

## 2013-10-08 DIAGNOSIS — IMO0001 Reserved for inherently not codable concepts without codable children: Secondary | ICD-10-CM | POA: Diagnosis not present

## 2013-10-11 ENCOUNTER — Ambulatory Visit: Payer: Medicare Other | Admitting: Physical Therapy

## 2013-10-11 DIAGNOSIS — IMO0001 Reserved for inherently not codable concepts without codable children: Secondary | ICD-10-CM | POA: Diagnosis not present

## 2013-10-13 ENCOUNTER — Ambulatory Visit: Payer: Medicare Other | Admitting: Physical Therapy

## 2013-10-13 DIAGNOSIS — IMO0001 Reserved for inherently not codable concepts without codable children: Secondary | ICD-10-CM | POA: Diagnosis not present

## 2013-10-19 ENCOUNTER — Ambulatory Visit (INDEPENDENT_AMBULATORY_CARE_PROVIDER_SITE_OTHER): Payer: Medicare Other | Admitting: Internal Medicine

## 2013-10-19 ENCOUNTER — Encounter: Payer: Self-pay | Admitting: Internal Medicine

## 2013-10-19 VITALS — BP 144/78 | HR 60 | Temp 97.8°F | Resp 16 | Ht 74.0 in | Wt 209.0 lb

## 2013-10-19 DIAGNOSIS — Z23 Encounter for immunization: Secondary | ICD-10-CM

## 2013-10-19 DIAGNOSIS — I10 Essential (primary) hypertension: Secondary | ICD-10-CM

## 2013-10-19 NOTE — Progress Notes (Signed)
   Subjective:    Patient ID: William Mills, male    DOB: 1934/10/08, 79 y.o.   MRN: [id number]  Hypertension This is a chronic problem. The current episode started more than 1 year ago. The problem is unchanged. The problem is controlled. Pertinent negatives include no anxiety, blurred vision, chest pain, headaches, malaise/fatigue, neck pain, orthopnea, palpitations, peripheral edema, PND, shortness of breath or sweats. Past treatments include diuretics, beta blockers and angiotensin blockers. The current treatment provides moderate improvement. There are no compliance problems.  Hypertensive end-organ damage includes CVA.      Review of Systems  Constitutional: Negative.  Negative for fever, chills, malaise/fatigue, diaphoresis, appetite change and fatigue.  HENT: Negative.   Eyes: Negative.  Negative for blurred vision.  Respiratory: Negative.  Negative for choking, chest tightness, shortness of breath and stridor.   Cardiovascular: Negative.  Negative for chest pain, palpitations, orthopnea and PND.  Gastrointestinal: Negative.  Negative for nausea, vomiting, abdominal pain, diarrhea, constipation and blood in stool.  Endocrine: Negative.   Genitourinary: Negative.   Musculoskeletal: Negative.  Negative for arthralgias, back pain, myalgias and neck pain.  Skin: Negative.  Negative for pallor.  Allergic/Immunologic: Negative.   Neurological: Negative.  Negative for dizziness, tremors, seizures, syncope, light-headedness, numbness and headaches.  Hematological: Negative.  Negative for adenopathy. Does not bruise/bleed easily.  Psychiatric/Behavioral: Negative.        Objective:   Physical Exam  Vitals reviewed. Constitutional: He is oriented to person, place, and time. He appears well-developed and well-nourished. No distress.  HENT:  Head: Normocephalic and atraumatic.  Mouth/Throat: Oropharynx is clear and moist. No oropharyngeal exudate.  Eyes: Conjunctivae are normal. Right  eye exhibits no discharge. Left eye exhibits no discharge. No scleral icterus.  Neck: Normal range of motion. Neck supple. No JVD present. No tracheal deviation present. No thyromegaly present.  Cardiovascular: Normal rate, regular rhythm, normal heart sounds and intact distal pulses.  Exam reveals no gallop and no friction rub.   No murmur heard. Pulmonary/Chest: Effort normal and breath sounds normal. No stridor. No respiratory distress. He has no wheezes. He has no rales. He exhibits no tenderness.  Abdominal: Soft. Bowel sounds are normal. He exhibits no distension and no mass. There is no tenderness. There is no rebound and no guarding.  Musculoskeletal: Normal range of motion. He exhibits no edema.  Lymphadenopathy:    He has no cervical adenopathy.  Neurological: He is oriented to person, place, and time.  Skin: Skin is warm and dry. No rash noted. He is not diaphoretic. No erythema.     Lab Results  Component Value Date   WBC 4.9 07/20/2013   HGB 13.5 07/20/2013   HCT 40.0 07/20/2013   PLT 212.0 07/20/2013   GLUCOSE 100* 07/20/2013   CHOL 172 12/24/2012   TRIG 122.0 12/24/2012   HDL 79.00 12/24/2012   LDLCALC 69 12/24/2012   ALT 20 07/20/2013   AST 19 07/20/2013   NA 138 07/20/2013   K 3.6 07/20/2013   CL 101 07/20/2013   CREATININE 0.8 07/20/2013   BUN 11 07/20/2013   CO2 29 07/20/2013   TSH 1.78 07/20/2013   PSA 1.36 11/21/2009   HGBA1C 5.9 12/24/2012       Assessment & Plan:

## 2013-10-19 NOTE — Patient Instructions (Signed)

## 2013-10-25 NOTE — Assessment & Plan Note (Signed)
His BP is well controlled He will cont the current regimen

## 2013-11-01 DIAGNOSIS — H401390 Pigmentary glaucoma, unspecified eye, stage unspecified: Secondary | ICD-10-CM | POA: Diagnosis not present

## 2013-11-01 DIAGNOSIS — H2510 Age-related nuclear cataract, unspecified eye: Secondary | ICD-10-CM | POA: Diagnosis not present

## 2013-11-01 DIAGNOSIS — H5450 Low vision, one eye, unspecified eye: Secondary | ICD-10-CM | POA: Diagnosis not present

## 2013-11-17 ENCOUNTER — Other Ambulatory Visit: Payer: Self-pay | Admitting: Internal Medicine

## 2013-12-03 ENCOUNTER — Encounter: Payer: Self-pay | Admitting: Neurology

## 2013-12-03 ENCOUNTER — Ambulatory Visit (INDEPENDENT_AMBULATORY_CARE_PROVIDER_SITE_OTHER): Payer: Medicare Other | Admitting: Neurology

## 2013-12-03 VITALS — BP 150/78 | HR 57 | Ht 74.0 in | Wt 210.0 lb

## 2013-12-03 DIAGNOSIS — G3184 Mild cognitive impairment, so stated: Secondary | ICD-10-CM | POA: Diagnosis not present

## 2013-12-03 DIAGNOSIS — G40209 Localization-related (focal) (partial) symptomatic epilepsy and epileptic syndromes with complex partial seizures, not intractable, without status epilepticus: Secondary | ICD-10-CM | POA: Diagnosis not present

## 2013-12-03 MED ORDER — LEVETIRACETAM ER 500 MG PO TB24: ORAL_TABLET | ORAL | Status: DC
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
## 2013-12-03 NOTE — Patient Instructions (Signed)
1. Start Levetiracetam ER 500mg : Take 1 tablet at bedtime for 1 week, then increase to 2 tablets at bedtime 2. Continue Phenobarbital 97.2mg  daily 3. Follow-up in 1 month

## 2014-01-05 ENCOUNTER — Encounter: Payer: Self-pay | Admitting: Neurology

## 2014-01-05 ENCOUNTER — Ambulatory Visit (INDEPENDENT_AMBULATORY_CARE_PROVIDER_SITE_OTHER): Payer: Medicare Other | Admitting: Neurology

## 2014-01-05 VITALS — BP 128/74 | HR 49 | Resp 16 | Ht 74.0 in | Wt 212.0 lb

## 2014-01-05 DIAGNOSIS — G3184 Mild cognitive impairment, so stated: Secondary | ICD-10-CM | POA: Diagnosis not present

## 2014-01-05 DIAGNOSIS — G40209 Localization-related (focal) (partial) symptomatic epilepsy and epileptic syndromes with complex partial seizures, not intractable, without status epilepticus: Secondary | ICD-10-CM | POA: Diagnosis not present

## 2014-01-05 DIAGNOSIS — R2681 Unsteadiness on feet: Secondary | ICD-10-CM | POA: Diagnosis not present

## 2014-01-05 MED ORDER — PHENOBARBITAL 32.4 MG PO TABS: ORAL_TABLET | ORAL | Status: DC
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
## 2014-01-08 ENCOUNTER — Encounter: Payer: Self-pay | Admitting: Neurology

## 2014-02-16 ENCOUNTER — Encounter: Payer: Self-pay | Admitting: Neurology

## 2014-02-16 ENCOUNTER — Ambulatory Visit (INDEPENDENT_AMBULATORY_CARE_PROVIDER_SITE_OTHER): Payer: Medicare Other | Admitting: Neurology

## 2014-02-16 VITALS — BP 126/80 | HR 49 | Resp 16 | Ht 74.0 in | Wt 219.0 lb

## 2014-02-16 DIAGNOSIS — G3184 Mild cognitive impairment, so stated: Secondary | ICD-10-CM | POA: Diagnosis not present

## 2014-02-16 DIAGNOSIS — G40209 Localization-related (focal) (partial) symptomatic epilepsy and epileptic syndromes with complex partial seizures, not intractable, without status epilepticus: Secondary | ICD-10-CM | POA: Diagnosis not present

## 2014-02-16 MED ORDER — PHENOBARBITAL 32.4 MG PO TABS
ORAL_TABLET | ORAL | Status: DC
Start: 2014-02-16 — End: 2014-05-09
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
## 2014-02-21 ENCOUNTER — Encounter: Payer: Self-pay | Admitting: Neurology

## 2014-03-18 ENCOUNTER — Ambulatory Visit (INDEPENDENT_AMBULATORY_CARE_PROVIDER_SITE_OTHER): Payer: Medicare Other | Admitting: Neurology

## 2014-03-18 ENCOUNTER — Encounter: Payer: Self-pay | Admitting: Neurology

## 2014-03-18 VITALS — BP 144/82 | HR 51 | Resp 16 | Ht 74.0 in | Wt 211.0 lb

## 2014-03-18 DIAGNOSIS — G40209 Localization-related (focal) (partial) symptomatic epilepsy and epileptic syndromes with complex partial seizures, not intractable, without status epilepticus: Secondary | ICD-10-CM

## 2014-03-18 DIAGNOSIS — G3184 Mild cognitive impairment, so stated: Secondary | ICD-10-CM | POA: Diagnosis not present
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
## 2014-03-18 NOTE — Patient Instructions (Signed)
1. Continue slow reduction with Phenobarbital 32.4mg  tablets: Starting Jan 29: take 1-1/2 tablets daily Starting Feb 29: Take 1 tablet daily Starting Mar 29: Take 1/2 tablet daily April 29: stop Phenobarbital  2. Continue Keppra 500mg : Take 2 tablets daily

## 2014-03-22 ENCOUNTER — Encounter: Payer: Self-pay | Admitting: Neurology

## 2014-03-30 ENCOUNTER — Other Ambulatory Visit: Payer: Self-pay | Admitting: Neurology

## 2014-05-02 DIAGNOSIS — H2589 Other age-related cataract: Secondary | ICD-10-CM | POA: Diagnosis not present

## 2014-05-04 ENCOUNTER — Other Ambulatory Visit: Payer: Self-pay | Admitting: Neurology

## 2014-05-09 ENCOUNTER — Other Ambulatory Visit: Payer: Self-pay | Admitting: Family Medicine

## 2014-05-09 DIAGNOSIS — G40209 Localization-related (focal) (partial) symptomatic epilepsy and epileptic syndromes with complex partial seizures, not intractable, without status epilepticus: Secondary | ICD-10-CM

## 2014-05-09 MED ORDER — PHENOBARBITAL 32.4 MG PO TABS: ORAL_TABLET | ORAL | Status: DC

## 2014-05-09 NOTE — Telephone Encounter (Signed)
Called patient to verify he was on the reduction schedule Dr. Karel JarvisAquino gave him for the Phenobarbital after receiving refill request from his pharmacy with different directions that what he was supposed to be taking. Patient did verify that he was taking the med on the scheduled that Dr. Karel JarvisAquino gave him to stop it. Rx refill was sent to patient's pharmacy with directions for the remainder of the schedule to stop the medication.

## 2014-05-17 ENCOUNTER — Ambulatory Visit (INDEPENDENT_AMBULATORY_CARE_PROVIDER_SITE_OTHER): Payer: Medicare Other | Admitting: Neurology

## 2014-05-17 ENCOUNTER — Encounter: Payer: Self-pay | Admitting: Neurology

## 2014-05-17 ENCOUNTER — Other Ambulatory Visit: Payer: Self-pay | Admitting: Internal Medicine

## 2014-05-17 VITALS — BP 138/82 | HR 55 | Resp 16 | Ht 74.0 in | Wt 219.0 lb

## 2014-05-17 DIAGNOSIS — G3184 Mild cognitive impairment, so stated: Secondary | ICD-10-CM

## 2014-05-17 DIAGNOSIS — G40209 Localization-related (focal) (partial) symptomatic epilepsy and epileptic syndromes with complex partial seizures, not intractable, without status epilepticus: Secondary | ICD-10-CM | POA: Diagnosis not present
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
## 2014-05-17 NOTE — Patient Instructions (Signed)
1. Reduce Phenobarbital to 1/2 tablet daily starting today, after one month, stop medication on April 29. 2. Continue Keppra 500mg : Take 2 tablets daily 3. Follow-up in 2 months, call our office for any problems  Seizure Precautions: 1. If medication has been prescribed for you to prevent seizures, take it exactly as directed.  Do not stop taking the medicine without talking to your doctor first, even if you have not had a seizure in a long time.   2. Avoid activities in which a seizure would cause danger to yourself or to others.  Don't operate dangerous machinery, swim alone, or climb in high or dangerous places, such as on ladders, roofs, or girders.  Do not drive unless your doctor says you may.  3. If you have any warning that you may have a seizure, lay down in a safe place where you can't hurt yourself.    4.  No driving for 6 months from last seizure, [place]   Please refer to the following link on the [employer]'s website for more information: http://www.epilepsyfoundation.org/answerplace/Social/driving/drivingu.cfm   5.  Maintain good sleep hygiene.  6.  Contact your doctor if you have any problems that may be related to the medicine you are taking.  7.  Call 911 and bring the patient back to the ED if:        A.  The seizure lasts longer than 5 minutes.       B.  The patient doesn't awaken shortly after the seizure  C.  The patient has new problems such as difficulty seeing, speaking or moving  D.  The patient was injured during the seizure  E.  The patient has a temperature over 102 F (39C)  F.  The patient vomited and now is having trouble breathing

## 2014-07-22 ENCOUNTER — Encounter: Payer: Self-pay | Admitting: Neurology

## 2014-07-22 ENCOUNTER — Ambulatory Visit (INDEPENDENT_AMBULATORY_CARE_PROVIDER_SITE_OTHER): Payer: Medicare Other | Admitting: Neurology

## 2014-07-22 VITALS — BP 126/62 | HR 47 | Resp 16 | Ht 74.0 in | Wt 213.0 lb

## 2014-07-22 DIAGNOSIS — G3184 Mild cognitive impairment, so stated: Secondary | ICD-10-CM

## 2014-07-22 DIAGNOSIS — R2681 Unsteadiness on feet: Secondary | ICD-10-CM

## 2014-07-22 DIAGNOSIS — G40209 Localization-related (focal) (partial) symptomatic epilepsy and epileptic syndromes with complex partial seizures, not intractable, without status epilepticus: Secondary | ICD-10-CM

## 2014-07-22 MED ORDER — LEVETIRACETAM ER 500 MG PO TB24
ORAL_TABLET | ORAL | Status: DC
Start: 2014-07-22 — End: 2014-10-21

## 2014-07-22 NOTE — Patient Instructions (Signed)
1. Continue Keppra XR 500mg : take 2 tablets at bedtime 2. Continue exercises 3. Follow-up in 3 months, call our office for any changes  Seizure Precautions: 1. If medication has been prescribed for you to prevent seizures, take it exactly as directed.  Do not stop taking the medicine without talking to your doctor first, even if you have not had a seizure in a long time.   2. Avoid activities in which a seizure would cause danger to yourself or to others.  Don't operate dangerous machinery, swim alone, or climb in high or dangerous places, such as on ladders, roofs, or girders.  Do not drive unless your doctor says you may.  3. If you have any warning that you may have a seizure, lay down in a safe place where you can't hurt yourself.    4.  No driving for 6 months from last seizure, [place] [facility] [place].   Please refer to the following link on the [employer]'s website for more information: http://www.epilepsyfoundation.org/answerplace/Social/driving/drivingu.cfm   5.  Maintain good sleep hygiene.  6.  Contact your doctor if you have any problems that may be related to the medicine you are taking.  7.  Call 911 and bring the patient back to the ED if:        A.  The seizure lasts longer than 5 minutes.       B.  The patient doesn't awaken shortly after the seizure  C.  The patient has new problems such as difficulty seeing, speaking or moving  D.  The patient was injured during the seizure  E.  The patient has a temperature over 102 F (39C)  F.  The patient vomited and now is having trouble breathing
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
## 2014-08-11 ENCOUNTER — Other Ambulatory Visit (INDEPENDENT_AMBULATORY_CARE_PROVIDER_SITE_OTHER): Payer: Medicare Other

## 2014-08-11 ENCOUNTER — Encounter: Payer: Self-pay | Admitting: Internal Medicine

## 2014-08-11 ENCOUNTER — Ambulatory Visit (INDEPENDENT_AMBULATORY_CARE_PROVIDER_SITE_OTHER): Payer: Medicare Other | Admitting: Internal Medicine

## 2014-08-11 VITALS — BP 130/78 | HR 50 | Temp 97.7°F | Resp 16 | Ht 74.0 in | Wt 209.2 lb

## 2014-08-11 DIAGNOSIS — E785 Hyperlipidemia, unspecified: Secondary | ICD-10-CM

## 2014-08-11 DIAGNOSIS — I10 Essential (primary) hypertension: Secondary | ICD-10-CM

## 2014-08-11 DIAGNOSIS — Z0000 Encounter for general adult medical examination without abnormal findings: Secondary | ICD-10-CM

## 2014-08-11 DIAGNOSIS — R739 Hyperglycemia, unspecified: Secondary | ICD-10-CM | POA: Diagnosis not present

## 2014-08-11 DIAGNOSIS — Z23 Encounter for immunization: Secondary | ICD-10-CM

## 2014-08-11 LAB — COMPREHENSIVE METABOLIC PANEL
ALBUMIN: 4.2 g/dL (ref 3.5–5.2)
ALT: 17 U/L (ref 0–53)
AST: 15 U/L (ref 0–37)
Alkaline Phosphatase: 63 U/L (ref 39–117)
BUN: 19 mg/dL (ref 6–23)
CALCIUM: 9.9 mg/dL (ref 8.4–10.5)
CHLORIDE: 105 meq/L (ref 96–112)
CO2: 33 mEq/L — ABNORMAL HIGH (ref 19–32)
Creatinine, Ser: 1.03 mg/dL (ref 0.40–1.50)
GFR: 74.24 mL/min (ref >60.00)
Glucose, Bld: 94 mg/dL (ref 70–99)
POTASSIUM: 4.1 meq/L (ref 3.5–5.1)
Sodium: 142 mEq/L (ref 135–145)
Total Bilirubin: 0.8 mg/dL (ref 0.2–1.2)
Total Protein: 7.6 g/dL (ref 6.0–8.3)

## 2014-08-11 LAB — LIPID PANEL
CHOL/HDL RATIO: 3
CHOLESTEROL: 158 mg/dL (ref 0–200)
HDL: 56.9 mg/dL (ref >39.00)
LDL CALC: 81 mg/dL (ref 0–99)
NONHDL: 101.1
Triglycerides: 103 mg/dL (ref 0.0–149.0)
VLDL: 20.6 mg/dL (ref 0.0–40.0)

## 2014-08-11 LAB — CBC WITH DIFFERENTIAL/PLATELET
Basophils Absolute: 0.1 10*3/uL (ref 0.0–0.1)
Basophils Relative: 1.1 % (ref 0.0–3.0)
EOS PCT: 3.7 % (ref 0.0–5.0)
Eosinophils Absolute: 0.3 10*3/uL (ref 0.0–0.7)
HEMATOCRIT: 44.1 % (ref 39.0–52.0)
HEMOGLOBIN: 14.6 g/dL (ref 13.0–17.0)
LYMPHS ABS: 3.1 10*3/uL (ref 0.7–4.0)
Lymphocytes Relative: 40.1 % (ref 12.0–46.0)
MCHC: 33 g/dL (ref 30.0–36.0)
MCV: 89.1 fl (ref 78.0–100.0)
MONOS PCT: 3.4 % (ref 3.0–12.0)
Monocytes Absolute: 0.3 10*3/uL (ref 0.1–1.0)
NEUTROS ABS: 4 10*3/uL (ref 1.4–7.7)
Neutrophils Relative %: 51.7 % (ref 43.0–77.0)
Platelets: 243 10*3/uL (ref 150.0–400.0)
RBC: 4.95 Mil/uL (ref 4.22–5.81)
RDW: 14 % (ref 11.5–15.5)
WBC: 7.7 10*3/uL (ref 4.0–10.5)

## 2014-08-11 LAB — TSH: TSH: 1.86 u[IU]/mL (ref 0.35–4.50)

## 2014-08-11 LAB — HEMOGLOBIN A1C: Hgb A1c MFr Bld: 5.7 % (ref 4.6–6.5)

## 2014-08-11 NOTE — Progress Notes (Signed)
Pre visit review using our clinic review tool, if applicable. No additional management support is needed unless otherwise documented below in the visit note. 

## 2014-08-11 NOTE — Patient Instructions (Signed)

## 2014-08-11 NOTE — Progress Notes (Signed)
Subjective:  Patient ID: William Mills, male    DOB: 05/06/1934  Age: 80 y.o. MRN: [id number]  CC: Hyperlipidemia; Hypertension; and Annual Exam   HPI William Mills presents for a CPX and BP check - he offers no complaints today.  Outpatient Prescriptions Prior to Visit  Medication Sig Dispense Refill  . atorvastatin (LIPITOR) 20 MG tablet TAKE ONE TABLET BY MOUTH ONCE DAILY 90 tablet 3  . atropine 1 % ophthalmic solution INSTILL ONE DROP TWICE DAILY AS DIRECTED 15 mL 0  . bimatoprost (LUMIGAN) 0.01 % SOLN INSTILL ONE DROP INTO LEFT EYE EVERY DAY AT BEDTIME 3 mL 5  . brimonidine-timolol (COMBIGAN) 0.2-0.5 % ophthalmic solution Place 1 drop into the right eye 2 (two) times daily. 5 mL 5  . levETIRAcetam (KEPPRA XR) 500 MG 24 hr tablet Take 2 tablets at bedtime 60 tablet 11  . nebivolol (BYSTOLIC) 5 MG tablet Take 1 tablet (5 mg total) by mouth daily. 30 tablet 11  . valsartan-hydrochlorothiazide (DIOVAN-HCT) 160-12.5 MG per tablet TAKE ONE TABLET BY MOUTH ONCE DAILY 90 tablet 1  . PREDNISOLONE ACETATE OP Apply to eye.       No facility-administered medications prior to visit.    ROS Review of Systems  Constitutional: Negative.  Negative for fever, chills, diaphoresis, appetite change and fatigue.  HENT: Negative.  Negative for trouble swallowing and voice change.   Eyes: Negative.  Negative for photophobia and visual disturbance.  Respiratory: Negative.  Negative for cough, choking, chest tightness, shortness of breath and stridor.   Cardiovascular: Negative.  Negative for chest pain, palpitations and leg swelling.  Gastrointestinal: Negative.  Negative for nausea, vomiting, abdominal pain, diarrhea, constipation and blood in stool.  Endocrine: Negative.   Genitourinary: Negative.  Negative for urgency, hematuria, decreased urine volume and difficulty urinating.  Musculoskeletal: Negative.  Negative for myalgias, back pain, arthralgias and neck pain.  Skin: Negative.  Negative  for rash.  Allergic/Immunologic: Negative.   Neurological: Negative.  Negative for dizziness, tremors, syncope, light-headedness, numbness and headaches.  Hematological: Negative.  Negative for adenopathy. Does not bruise/bleed easily.  Psychiatric/Behavioral: Negative.     Objective:  BP 130/78 mmHg  Pulse 50  Temp(Src) 97.7 F (36.5 C) (Oral)  Resp 16  Ht  (1.88 m)  Wt 209 lb 4 oz (94.915 kg)  BMI 26.85 kg/m2  SpO2 99%  BP Readings from Last 3 Encounters:  08/11/14 130/78  07/22/14 126/62  05/17/14 138/82    Wt Readings from Last 3 Encounters:  08/11/14 209 lb 4 oz (94.915 kg)  07/22/14 213 lb (96.616 kg)  05/17/14 219 lb (99.338 kg)    Physical Exam  Constitutional: He is oriented to person, place, and time. He appears well-developed and well-nourished. No distress.  HENT:  Head: Normocephalic and atraumatic.  Mouth/Throat: Oropharynx is clear and moist. No oropharyngeal exudate.  Eyes: Conjunctivae are normal. Right eye exhibits no discharge. Left eye exhibits no discharge. No scleral icterus.  Neck: Normal range of motion. Neck supple. No JVD present. No tracheal deviation present. No thyromegaly present.  Cardiovascular: Normal rate, regular rhythm, normal heart sounds and intact distal pulses.  Exam reveals no gallop and no friction rub.   No murmur heard. Pulmonary/Chest: Effort normal and breath sounds normal. No stridor. No respiratory distress. He has no wheezes. He has no rales. He exhibits no tenderness.  Abdominal: Soft. Bowel sounds are normal. He exhibits no distension and no mass. There is no tenderness. There is no rebound and no  guarding.  Musculoskeletal: Normal range of motion. He exhibits no edema or tenderness.  Lymphadenopathy:    He has no cervical adenopathy.  Neurological: He is oriented to person, place, and time.  Skin: Skin is warm and dry. No rash noted. He is not diaphoretic. No erythema. No pallor.  Psychiatric: He has a normal mood  and affect. Judgment and thought content normal. His mood appears not anxious. His affect is not angry, not blunt, not labile and not inappropriate. His speech is delayed and tangential. His speech is not rapid and/or pressured and not slurred. He is slowed and withdrawn. He is not actively hallucinating. Cognition and memory are impaired. He does not exhibit a depressed mood. He is communicative. He exhibits abnormal recent memory and abnormal remote memory. He is inattentive.  Vitals reviewed.   Lab Results  Component Value Date   WBC 7.7 08/11/2014   HGB 14.6 08/11/2014   HCT 44.1 08/11/2014   PLT 243.0 08/11/2014   GLUCOSE 94 08/11/2014   CHOL 158 08/11/2014   TRIG 103.0 08/11/2014   HDL 56.90 08/11/2014   LDLCALC 81 08/11/2014   ALT 17 08/11/2014   AST 15 08/11/2014   NA 142 08/11/2014   K 4.1 08/11/2014   CL 105 08/11/2014   CREATININE 1.03 08/11/2014   BUN 19 08/11/2014   CO2 33* 08/11/2014   TSH 1.86 08/11/2014   PSA 1.36 11/21/2009   HGBA1C 5.7 08/11/2014    Ct Head Wo Contrast  09/03/2013   CLINICAL DATA:  Seizures, gait instability.  EXAM: CT HEAD WITHOUT CONTRAST  TECHNIQUE: Contiguous axial images were obtained from the base of the skull through the vertex without intravenous contrast.  COMPARISON:  None.  FINDINGS: Status post left temporal and parietal craniotomy. Ventriculoperitoneal shunt is seen entering right occipital skull with tip in right lateral ventricle. 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 Ventricular size is within normal limits. No mass effect or midline shift is noted. There is no evidence of hemorrhage, acute infarction or mass.  IMPRESSION: Right occipital ventriculostomy noted with catheter tip in right lateral ventricle. Ventricular size is within normal limits. 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 No acute intracranial abnormality seen.   Electronically Signed    By: Roque Lias M.D.   On: 09/03/2013 15:05    Assessment & Plan:   Makye was seen today for hyperlipidemia, hypertension and annual exam.  Diagnoses and all orders for this visit:  Essential hypertension - his BP is well controlled, lytes and renal function are stable Orders: -     Comprehensive metabolic panel; Future -     CBC with Differential/Platelet; Future  Hyperlipidemia with target LDL less than 130 - he has achieved his LDL goal Orders: -     Lipid panel; Future -     Comprehensive metabolic panel; Future -     TSH; Future  Hyperglycemia - he has prediabetes and dose not require treatment at this time Orders: -     Comprehensive metabolic panel; Future -     Hemoglobin A1c; Future  Need for prophylactic vaccination and inoculation against unspecified single disease Orders: -     Varicella-zoster vaccine subcutaneous   I have discontinued Mr. Hillmer PREDNISOLONE ACETATE OP. I am also having him maintain his brimonidine-timolol, bimatoprost, nebivolol, atorvastatin, atropine, valsartan-hydrochlorothiazide, levETIRAcetam, and fluorometholone.  Meds ordered this encounter  Medications  . fluorometholone (FML) 0.1 % ophthalmic suspension    Sig: Place 1  drop into the left eye 2 (two) times daily.   See AVS for instructions about healthy living and anticipatory guidance.   Follow-up: Return in about 6 months (around 02/10/2015).  Sanda Linger, MD

## 2014-08-14 NOTE — Assessment & Plan Note (Signed)

## 2014-08-17 ENCOUNTER — Other Ambulatory Visit: Payer: Self-pay | Admitting: Internal Medicine

## 2014-09-14 ENCOUNTER — Other Ambulatory Visit: Payer: Self-pay | Admitting: Internal Medicine

## 2014-10-21 ENCOUNTER — Encounter: Payer: Self-pay | Admitting: Neurology

## 2014-10-21 ENCOUNTER — Ambulatory Visit (INDEPENDENT_AMBULATORY_CARE_PROVIDER_SITE_OTHER): Payer: Medicare Other | Admitting: Neurology

## 2014-10-21 VITALS — BP 138/72 | HR 54 | Ht 74.0 in | Wt 214.5 lb

## 2014-10-21 DIAGNOSIS — G40209 Localization-related (focal) (partial) symptomatic epilepsy and epileptic syndromes with complex partial seizures, not intractable, without status epilepticus: Secondary | ICD-10-CM

## 2014-10-21 DIAGNOSIS — G3184 Mild cognitive impairment, so stated: Secondary | ICD-10-CM | POA: Diagnosis not present

## 2014-10-21 DIAGNOSIS — R2681 Unsteadiness on feet: Secondary | ICD-10-CM | POA: Diagnosis not present

## 2014-10-21 MED ORDER — LEVETIRACETAM ER 500 MG PO TB24: ORAL_TABLET | ORAL | Status: DC
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
## 2014-10-21 NOTE — Patient Instructions (Signed)
1. Continue Keppra XR : Take 2 tablets daily 2. Continue exercise, proper diet 3. Follow-up in 6 months, call for any changes  Seizure Precautions: 1. If medication has been prescribed for you to prevent seizures, take it exactly as directed.  Do not stop taking the medicine without talking to your doctor first, even if you have not had a seizure in a long time.   2. Avoid activities in which a seizure would cause danger to yourself or to others.  Don't operate dangerous machinery, swim alone, or climb in high or dangerous places, such as on ladders, roofs, or girders.  Do not drive unless your doctor says you may.  3. If you have any warning that you may have a seizure, lay down in a safe place where you can't hurt yourself.    4.  No driving for 6 months from last seizure, as per [facility].   Please refer to the following link on the [employer]'s website for more information: http://www.epilepsyfoundation.org/answerplace/Social/driving/drivingu.cfm   5.  Maintain good sleep hygiene.  6.  Contact your doctor if you have any problems that may be related to the medicine you are taking.  7.  Call 911 and bring the patient back to the ED if:        A.  The seizure lasts longer than 5 minutes.       B.  The patient doesn't awaken shortly after the seizure  C.  The patient has new problems such as difficulty seeing, speaking or moving  D.  The patient was injured during the seizure  E.  The patient has a temperature over 102 F (39C)  F.  The patient vomited and now is having trouble breathing

## 2014-10-25 DIAGNOSIS — H401433 Capsular glaucoma with pseudoexfoliation of lens, bilateral, severe stage: Secondary | ICD-10-CM | POA: Diagnosis not present

## 2014-10-25 DIAGNOSIS — H269 Unspecified cataract: Secondary | ICD-10-CM | POA: Diagnosis not present

## 2014-11-21 ENCOUNTER — Other Ambulatory Visit: Payer: Self-pay | Admitting: Internal Medicine

## 2014-12-07 DIAGNOSIS — H401433 Capsular glaucoma with pseudoexfoliation of lens, bilateral, severe stage: Secondary | ICD-10-CM | POA: Diagnosis not present

## 2015-02-22 ENCOUNTER — Other Ambulatory Visit: Payer: Self-pay | Admitting: Internal Medicine

## 2015-03-15 DIAGNOSIS — H401493 Capsular glaucoma with pseudoexfoliation of lens, unspecified eye, severe stage: Secondary | ICD-10-CM | POA: Diagnosis not present

## 2015-03-24 IMAGING — CT CT HEAD W/O CM
1 of 2 series · 16 of 30 positions shown, 20 images · non-contrast
Comparison: None.

CLINICAL DATA: Seizures, gait instability.

EXAM:
CT HEAD WITHOUT CONTRAST
TECHNIQUE: Contiguous axial images were obtained from the base of the skull
through the vertex without intravenous contrast.

[Series 3: head 2.0 h70h · axial · 0.43mm/px · z∈[+1133,+1287]mm · 16 of 87 slices shown, 20 images]
[im 5/87  brain]
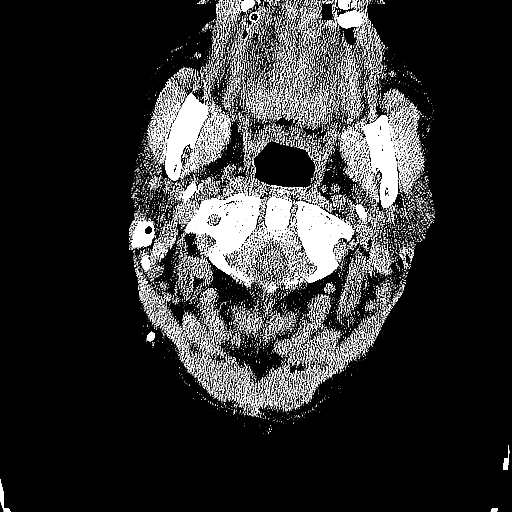
[im 5/87  bone]
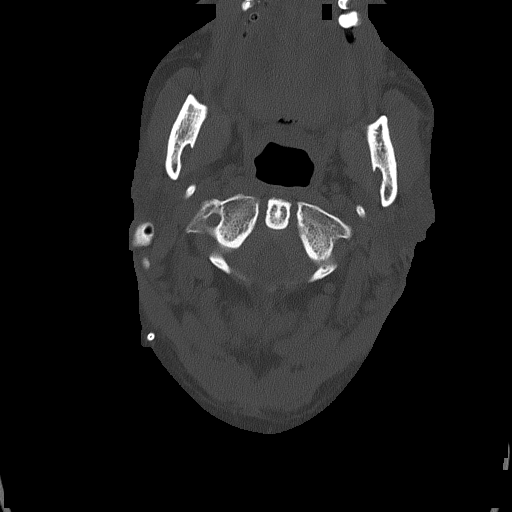
[im 9/87  brain]
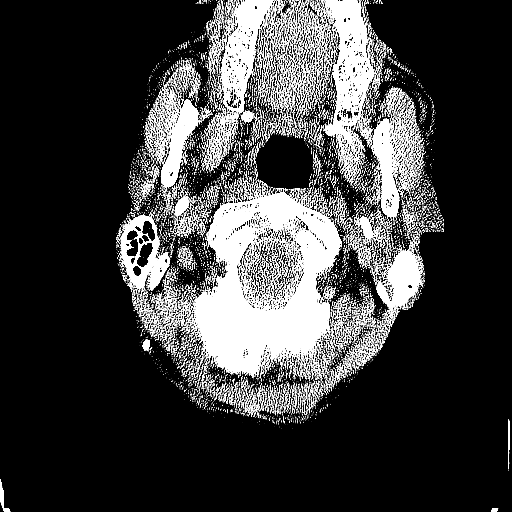
[im 13/87  brain]
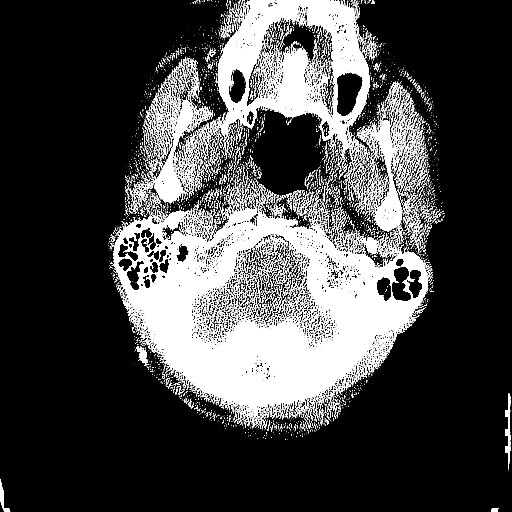
[im 22/87  brain]
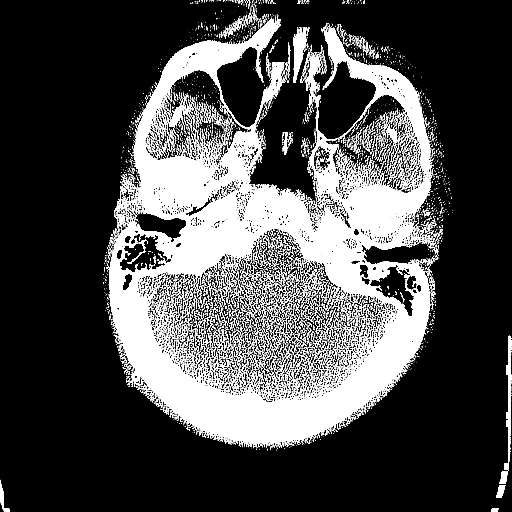
[im 26/87  brain]
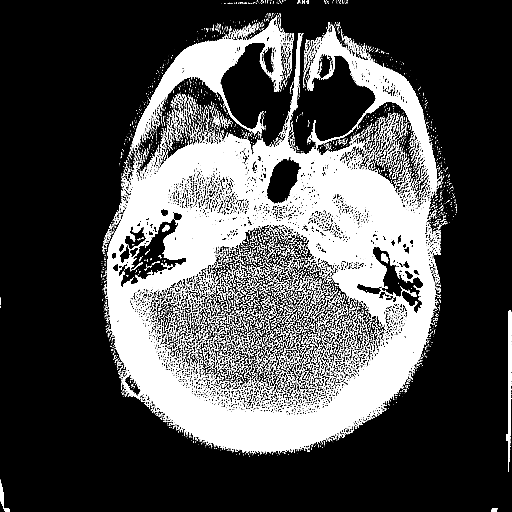
[im 26/87  bone]
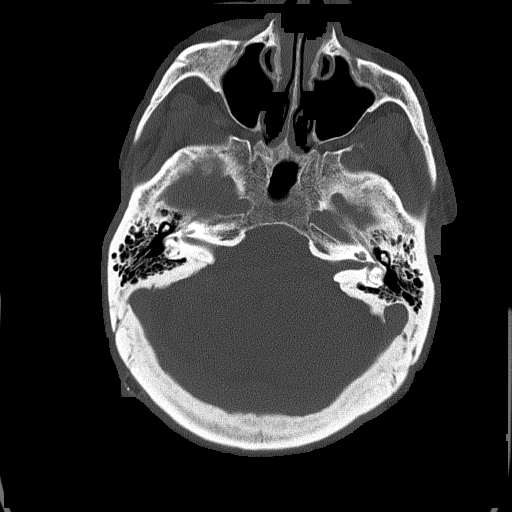
[im 31/87  brain]
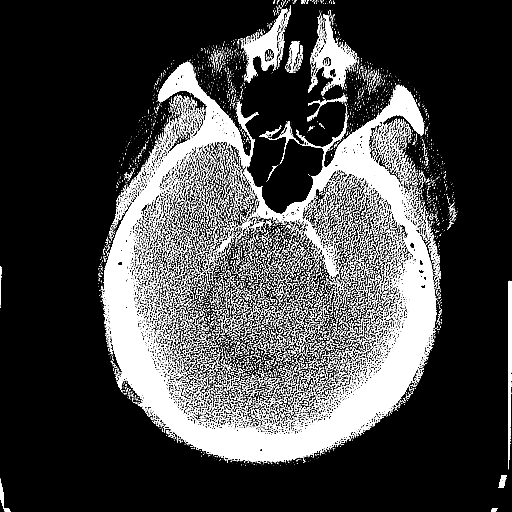
[im 35/87  brain]
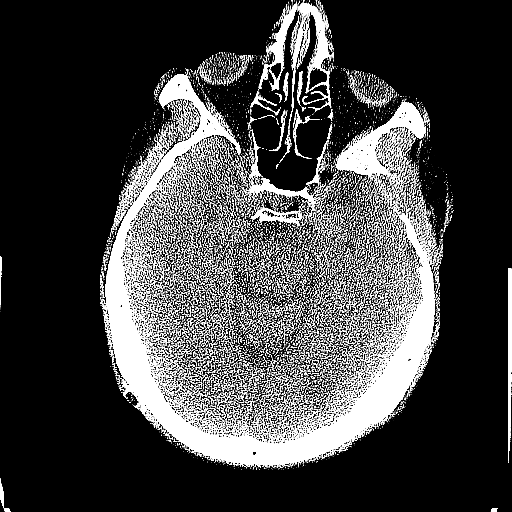
[im 39/87  brain]
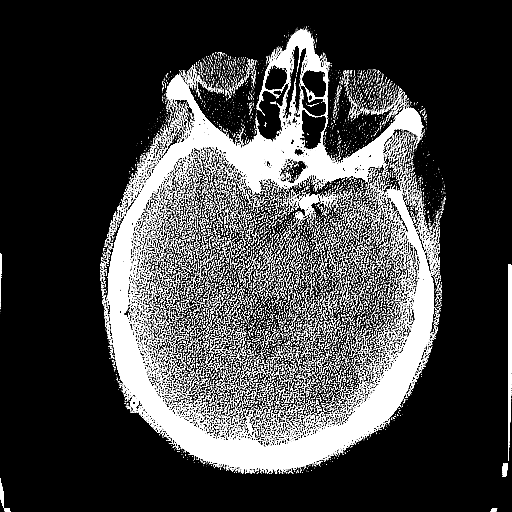
[im 48/87  brain]
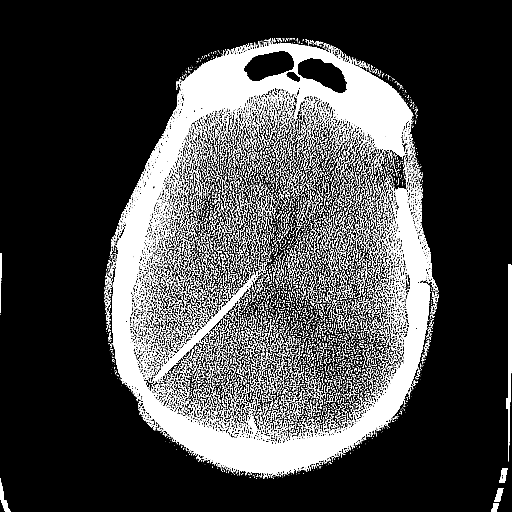
[im 48/87  bone]
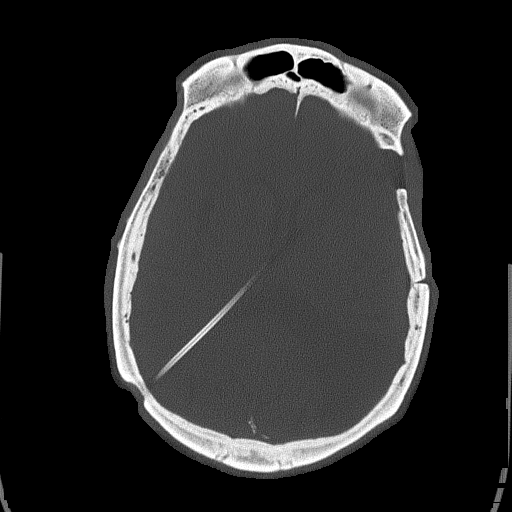
[im 52/87  brain]
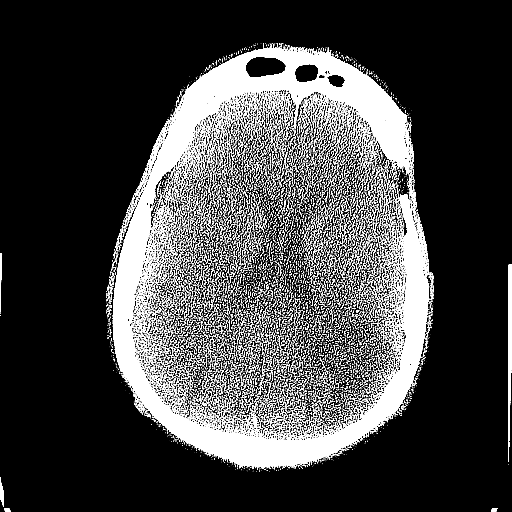
[im 56/87  brain]
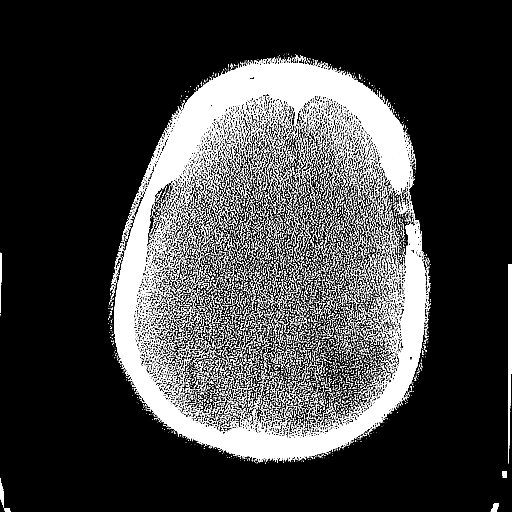
[im 61/87  brain]
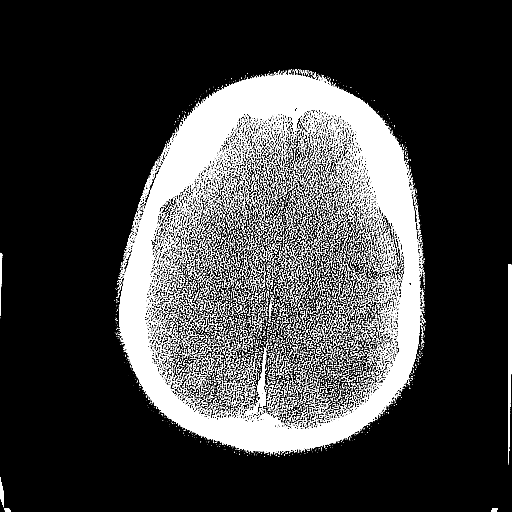
[im 65/87  brain]
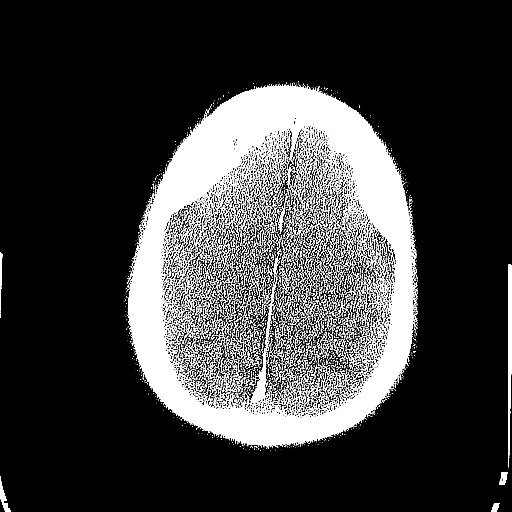
[im 65/87  bone]
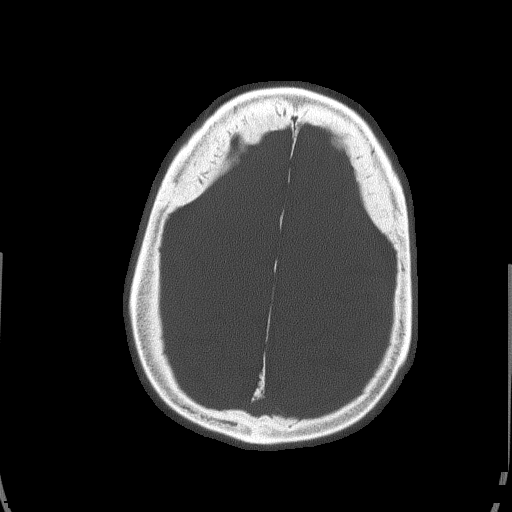
[im 74/87  brain]
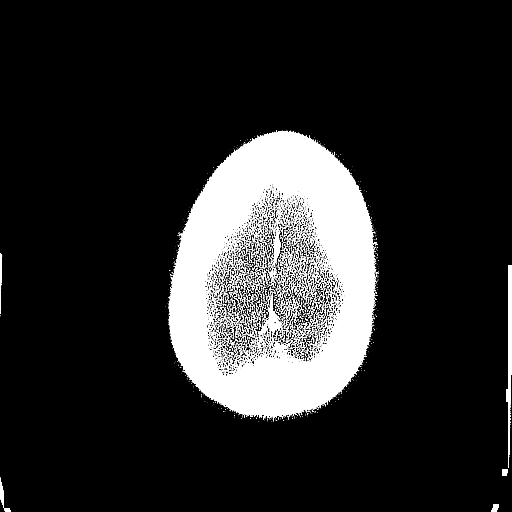
[im 78/87  brain]
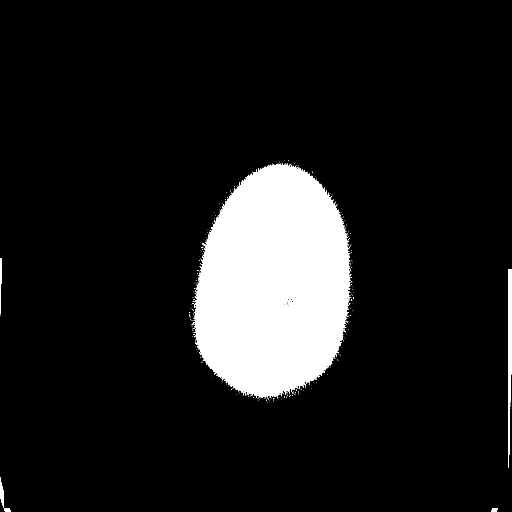
[im 82/87  brain]
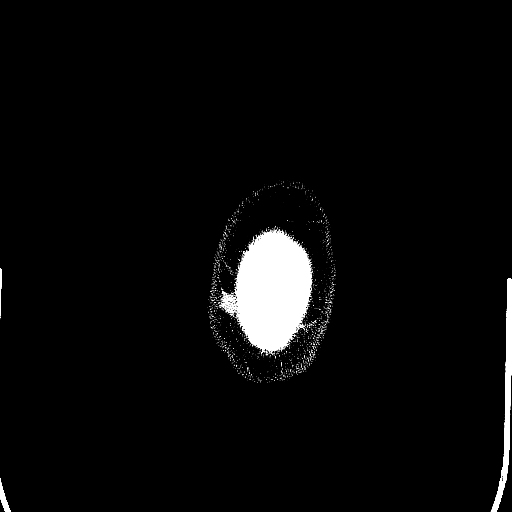

[16 of 30 positions shown; findings below may reference images not displayed]

FINDINGS: Status post left temporal and parietal craniotomy.
Ventriculoperitoneal shunt is seen entering right occipital skull
with tip in right lateral ventricle. Left posterior parietal
encephalomalacia is noted consistent with old infarction.
Ventricular size is within normal limits. No mass effect or midline
shift is noted. There is no evidence of hemorrhage, acute infarction
or mass.
IMPRESSION: Right occipital ventriculostomy noted with catheter tip in right
lateral ventricle. Ventricular size is within normal limits. Left
posterior parietal encephalomalacia is noted consistent with old
infarction. No acute intracranial abnormality seen.

## 2015-04-24 ENCOUNTER — Ambulatory Visit (INDEPENDENT_AMBULATORY_CARE_PROVIDER_SITE_OTHER): Payer: Medicare Other | Admitting: Neurology

## 2015-04-24 ENCOUNTER — Encounter: Payer: Self-pay | Admitting: Neurology

## 2015-04-24 VITALS — BP 128/78 | HR 49 | Ht 74.0 in | Wt 227.0 lb

## 2015-04-24 DIAGNOSIS — G3184 Mild cognitive impairment, so stated: Secondary | ICD-10-CM | POA: Diagnosis not present

## 2015-04-24 DIAGNOSIS — G40209 Localization-related (focal) (partial) symptomatic epilepsy and epileptic syndromes with complex partial seizures, not intractable, without status epilepticus: Secondary | ICD-10-CM

## 2015-04-24 MED ORDER — LEVETIRACETAM ER 500 MG PO TB24: ORAL_TABLET | ORAL | Status: DC

## 2015-04-24 NOTE — Patient Instructions (Signed)
1. Continue Levetiracetam ER 500mg : Take 2 tablets at night 2. Follow-up in 1 year, call for any changes  Seizure Precautions: 1. If medication has been prescribed for you to prevent seizures, take it exactly as directed.  Do not stop taking the medicine without talking to your doctor first, even if you have not had a seizure in a long time.   2. Avoid activities in which a seizure would cause danger to yourself or to others.  Don't operate dangerous machinery, swim alone, or climb in high or dangerous places, such as on ladders, roofs, or girders.  Do not drive unless your doctor says you may.  3. If you have any warning that you may have a seizure, lay down in a safe place where you can't hurt yourself.    4.  No driving for 6 months from last seizure, as [place] [facility] [place] [place].   Please refer to the following link on the [employer]ile[employer]'s website for more information: http://www.epilepsyfoundation.org/answerplace/Social/driving/drivingu.cfm   5.  Maintain good sleep hygiene. Avoid alcohol.  6.  Contact your doctor if you have any problems that may be related to the medicine you are taking.  7.  Call 911 and bring the patient back to the ED if:        A.  The seizure lasts longer than 5 minutes.       B.  The patient doesn't awaken shortly after the seizure  C.  The patient has new problems such as difficulty seeing, speaking or moving  D.  The patient was injured during the seizure  E.  The patient has a temperature over 102 F (39C)  F.  The patient vomited and now is having trouble breathing
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
## 2015-05-25 ENCOUNTER — Other Ambulatory Visit: Payer: Self-pay | Admitting: Internal Medicine

## 2015-06-21 DIAGNOSIS — H401493 Capsular glaucoma with pseudoexfoliation of lens, unspecified eye, severe stage: Secondary | ICD-10-CM | POA: Diagnosis not present

## 2015-07-21 ENCOUNTER — Encounter: Payer: Self-pay | Admitting: Neurology

## 2015-08-11 ENCOUNTER — Other Ambulatory Visit: Payer: Self-pay | Admitting: Internal Medicine

## 2015-08-21 ENCOUNTER — Other Ambulatory Visit: Payer: Self-pay | Admitting: Internal Medicine

## 2015-09-08 ENCOUNTER — Other Ambulatory Visit: Payer: Self-pay | Admitting: Internal Medicine

## 2015-09-12 ENCOUNTER — Telehealth: Payer: Self-pay | Admitting: Internal Medicine

## 2015-09-13 MED ORDER — NEBIVOLOL HCL 5 MG PO TABS: 5.0000 mg | ORAL_TABLET | Freq: Every day | ORAL | 0 refills | Status: DC

## 2015-09-13 MED ORDER — VALSARTAN-HYDROCHLOROTHIAZIDE 160-12.5 MG PO TABS: 1.0000 | ORAL_TABLET | Freq: Every day | ORAL | 0 refills | Status: DC

## 2015-09-13 MED ORDER — ATORVASTATIN CALCIUM 20 MG PO TABS: 20.0000 mg | ORAL_TABLET | Freq: Every day | ORAL | 0 refills | Status: DC

## 2015-09-13 NOTE — Telephone Encounter (Signed)
Patient has scheduled 1 year fu with Dr. Yetta Barre for 8/2.  Please refill medication to get him through until then.

## 2015-09-13 NOTE — Telephone Encounter (Signed)
erx sent to [facility].  

## 2015-09-20 ENCOUNTER — Ambulatory Visit (INDEPENDENT_AMBULATORY_CARE_PROVIDER_SITE_OTHER): Payer: Medicare Other | Admitting: Internal Medicine

## 2015-09-20 ENCOUNTER — Other Ambulatory Visit (INDEPENDENT_AMBULATORY_CARE_PROVIDER_SITE_OTHER): Payer: Medicare Other

## 2015-09-20 ENCOUNTER — Encounter: Payer: Self-pay | Admitting: Internal Medicine

## 2015-09-20 VITALS — BP 130/62 | HR 54 | Temp 97.8°F | Resp 16 | Wt 226.0 lb

## 2015-09-20 DIAGNOSIS — G40209 Localization-related (focal) (partial) symptomatic epilepsy and epileptic syndromes with complex partial seizures, not intractable, without status epilepticus: Secondary | ICD-10-CM

## 2015-09-20 DIAGNOSIS — F0390 Unspecified dementia without behavioral disturbance: Secondary | ICD-10-CM

## 2015-09-20 DIAGNOSIS — E785 Hyperlipidemia, unspecified: Secondary | ICD-10-CM | POA: Diagnosis not present

## 2015-09-20 DIAGNOSIS — F068 Other specified mental disorders due to known physiological condition: Secondary | ICD-10-CM

## 2015-09-20 DIAGNOSIS — I10 Essential (primary) hypertension: Secondary | ICD-10-CM

## 2015-09-20 LAB — CBC WITH DIFFERENTIAL/PLATELET
BASOS ABS: 0 10*3/uL (ref 0.0–0.1)
BASOS PCT: 0.3 % (ref 0.0–3.0)
Eosinophils Absolute: 0.3 10*3/uL (ref 0.0–0.7)
Eosinophils Relative: 4 % (ref 0.0–5.0)
HCT: 38.4 % — ABNORMAL LOW (ref 39.0–52.0)
Hemoglobin: 12.9 g/dL — ABNORMAL LOW (ref 13.0–17.0)
LYMPHS ABS: 3 10*3/uL (ref 0.7–4.0)
Lymphocytes Relative: 38 % (ref 12.0–46.0)
MCHC: 33.5 g/dL (ref 30.0–36.0)
MCV: 88 fl (ref 78.0–100.0)
MONOS PCT: 5.7 % (ref 3.0–12.0)
Monocytes Absolute: 0.5 10*3/uL (ref 0.1–1.0)
NEUTROS ABS: 4.1 10*3/uL (ref 1.4–7.7)
NEUTROS PCT: 52 % (ref 43.0–77.0)
PLATELETS: 212 10*3/uL (ref 150.0–400.0)
RBC: 4.37 Mil/uL (ref 4.22–5.81)
RDW: 14.9 % (ref 11.5–15.5)
WBC: 7.9 10*3/uL (ref 4.0–10.5)

## 2015-09-20 LAB — COMPREHENSIVE METABOLIC PANEL
ALT: 13 U/L (ref 0–53)
AST: 16 U/L (ref 0–37)
Albumin: 4.2 g/dL (ref 3.5–5.2)
Alkaline Phosphatase: 62 U/L (ref 39–117)
BUN: 17 mg/dL (ref 6–23)
CHLORIDE: 108 meq/L (ref 96–112)
CO2: 24 meq/L (ref 19–32)
Calcium: 10.1 mg/dL (ref 8.4–10.5)
Creatinine, Ser: 0.93 mg/dL (ref 0.40–1.50)
GFR: 83.29 mL/min (ref >60.00)
GLUCOSE: 92 mg/dL (ref 70–99)
POTASSIUM: 4.5 meq/L (ref 3.5–5.1)
Sodium: 145 mEq/L (ref 135–145)
Total Bilirubin: 0.8 mg/dL (ref 0.2–1.2)
Total Protein: 7.5 g/dL (ref 6.0–8.3)

## 2015-09-20 LAB — LIPID PANEL
CHOL/HDL RATIO: 3
Cholesterol: 158 mg/dL (ref 0–200)
HDL: 58 mg/dL (ref >39.00)
LDL CALC: 70 mg/dL (ref 0–99)
NonHDL: 99.62
TRIGLYCERIDES: 147 mg/dL (ref 0.0–149.0)
VLDL: 29.4 mg/dL (ref 0.0–40.0)

## 2015-09-20 LAB — TSH: TSH: 1.65 u[IU]/mL (ref 0.35–4.50)

## 2015-09-20 MED ORDER — CARVEDILOL 3.125 MG PO TABS: 3.1250 mg | ORAL_TABLET | Freq: Two times a day (BID) | ORAL | 3 refills | Status: DC

## 2015-09-20 NOTE — Patient Instructions (Signed)
Hypertension Hypertension, commonly called high blood pressure, is when the force of blood pumping through your arteries is too strong. Your arteries are the blood vessels that carry blood from your heart throughout your body. A blood pressure reading consists of a higher number over a lower number, such as 110/72. The higher number (systolic) is the pressure inside your arteries when your heart pumps. The lower number (diastolic) is the pressure inside your arteries when your heart relaxes. Ideally you want your blood pressure below 120/80. Hypertension forces your heart to work harder to pump blood. Your arteries may become narrow or stiff. Having untreated or uncontrolled hypertension can cause heart attack, stroke, kidney disease, and other problems. RISK FACTORS Some risk factors for high blood pressure are controllable. Others are not.  Risk factors you cannot control include:   Race. You may be at higher risk if you are African American.  Age. Risk increases with age.  Gender. Men are at higher risk than women before age 45 years. After age 65, women are at higher risk than men. Risk factors you can control include:  Not getting enough exercise or physical activity.  Being overweight.  Getting too much fat, sugar, calories, or salt in your diet.  Drinking too much alcohol. SIGNS AND SYMPTOMS Hypertension does not usually cause signs or symptoms. Extremely high blood pressure (hypertensive crisis) may cause headache, anxiety, shortness of breath, and nosebleed. DIAGNOSIS To check if you have hypertension, your health care provider will measure your blood pressure while you are seated, with your arm held at the level of your heart. It should be measured at least twice using the same arm. Certain conditions can cause a difference in blood pressure between your right and left arms. A blood pressure reading that is higher than normal on one occasion does not mean that you need treatment. If  it is not clear whether you have high blood pressure, you may be asked to return on a different day to have your blood pressure checked again. Or, you may be asked to monitor your blood pressure at home for 1 or more weeks. TREATMENT Treating high blood pressure includes making lifestyle changes and possibly taking medicine. Living a healthy lifestyle can help lower high blood pressure. You may need to change some of your habits. Lifestyle changes may include:  Following the DASH diet. This diet is high in fruits, vegetables, and whole grains. It is low in salt, red meat, and added sugars.  Keep your sodium intake below 2,300 mg per day.  Getting at least 30-45 minutes of aerobic exercise at least 4 times per week.  Losing weight if necessary.  Not smoking.  Limiting alcoholic beverages.  Learning ways to reduce stress. Your health care provider may prescribe medicine if lifestyle changes are not enough to get your blood pressure under control, and if one of the following is true:  You are 18-59 years of age and your systolic blood pressure is above 140.  You are 60 years of age or older, and your systolic blood pressure is above 150.  Your diastolic blood pressure is above 90.  You have diabetes, and your systolic blood pressure is over 140 or your diastolic blood pressure is over 90.  You have kidney disease and your blood pressure is above 140/90.  You have heart disease and your blood pressure is above 140/90. Your personal target blood pressure may vary depending on your medical conditions, your age, and other factors. HOME CARE INSTRUCTIONS    Have your blood pressure rechecked as directed by your health care provider.   Take medicines only as directed by your health care provider. Follow the directions carefully. Blood pressure medicines must be taken as prescribed. The medicine does not work as well when you skip doses. Skipping doses also puts you at risk for  problems.  Do not smoke.   Monitor your blood pressure at home as directed by your health care provider. SEEK MEDICAL CARE IF:   You think you are having a reaction to medicines taken.  You have recurrent headaches or feel dizzy.  You have swelling in your ankles.  You have trouble with your vision. SEEK IMMEDIATE MEDICAL CARE IF:  You develop a severe headache or confusion.  You have unusual weakness, numbness, or feel faint.  You have severe chest or abdominal pain.  You vomit repeatedly.  You have trouble breathing. MAKE SURE YOU:   Understand these instructions.  Will watch your condition.  Will get help right away if you are not doing well or get worse.   This information is not intended to replace advice given to you by your health care provider. Make sure you discuss any questions you have with your health care provider.   Document Released: 02/04/2005 Document Revised: 06/21/2014 Document Reviewed: 11/27/2012 Elsevier Interactive Patient Education 2016 [employer]  

## 2015-09-20 NOTE — Progress Notes (Signed)
Subjective:  Patient ID: William Mills, male    DOB: 1935/01/21  Age: 81 y.o. MRN: [id number]  CC: Hypertension and Hyperlipidemia   HPI William Mills presents for a blood pressure check and follow-up on hypercholesterolemia. He tells me his blood pressure has been well controlled on the combination of a beta blocker, ARB and hydrochlorothiazide but he complains nebivolol is too expensive and he wants it change to a generic.  He is tolerating atorvastatin well with no muscle or joint aches.  He has had no seizures since his last visit  Outpatient Medications Prior to Visit  Medication Sig Dispense Refill  . atorvastatin (LIPITOR) 20 MG tablet Take 1 tablet (20 mg total) by mouth daily. 30 tablet 0  . bimatoprost (LUMIGAN) 0.01 % SOLN INSTILL ONE DROP INTO LEFT EYE EVERY DAY AT BEDTIME 3 mL 5  . brimonidine-timolol (COMBIGAN) 0.2-0.5 % ophthalmic solution Place 1 drop into the right eye 2 (two) times daily. 5 mL 5  . dorzolamide (TRUSOPT) 2 % ophthalmic solution Place 1 drop into both eyes 2 (two) times daily.    . fluorometholone (FML) 0.1 % ophthalmic suspension Place 1 drop into the left eye 2 (two) times daily.    Marland Kitchen levETIRAcetam (KEPPRA XR) 500 MG 24 hr tablet Take 2 tablets at bedtime 180 tablet 3  . valsartan-hydrochlorothiazide (DIOVAN-HCT) 160-12.5 MG tablet Take 1 tablet by mouth daily. Yearly pjysical is due must see MD for refills 30 tablet 0  . nebivolol (BYSTOLIC) 5 MG tablet Take 1 tablet (5 mg total) by mouth daily. Overdue for yearly physical w/labs must see MD for refills 30 tablet 0   No facility-administered medications prior to visit.     ROS Review of Systems  Constitutional: Negative.  Negative for activity change, chills, diaphoresis, fatigue and fever.  HENT: Negative.  Negative for trouble swallowing.   Eyes: Negative.  Negative for visual disturbance.  Respiratory: Negative.  Negative for cough, choking, chest tightness, shortness of breath and  stridor.   Cardiovascular: Negative.  Negative for chest pain, palpitations and leg swelling.  Gastrointestinal: Negative.  Negative for abdominal pain, constipation, diarrhea and vomiting.  Endocrine: Negative.   Genitourinary: Negative.   Musculoskeletal: Negative.  Negative for back pain and myalgias.  Skin: Negative.   Allergic/Immunologic: Negative.   Neurological: Negative.  Negative for dizziness, tremors, speech difficulty, weakness, light-headedness, numbness and headaches.  Hematological: Negative.  Negative for adenopathy. Does not bruise/bleed easily.  Psychiatric/Behavioral: Positive for confusion and decreased concentration. Negative for behavioral problems and sleep disturbance. The patient is not nervous/anxious.     Objective:  BP 130/62 (BP Location: Left Arm, Patient Position: Sitting)   Pulse (!) 54   Temp 97.8 F (36.6 C) (Oral)   Resp 16   Wt 226 lb (102.5 kg)   SpO2 95%   BMI 29.02 kg/m   BP Readings from Last 3 Encounters:  09/20/15 130/62  04/24/15 128/78  10/21/14 138/72    Wt Readings from Last 3 Encounters:  09/20/15 226 lb (102.5 kg)  04/24/15 227 lb (103 kg)  10/21/14 214 lb 8 oz (97.3 kg)    Physical Exam  Constitutional: He is oriented to person, place, and time. No distress.  HENT:  Head: Normocephalic and atraumatic.  Mouth/Throat: Oropharynx is clear and moist. No oropharyngeal exudate.  Eyes: Conjunctivae are normal. Right eye exhibits no discharge. Left eye exhibits no discharge. No scleral icterus.  Neck: Normal range of motion. Neck supple. No JVD present. No tracheal deviation  present. No thyromegaly present.  Cardiovascular: Normal rate, regular rhythm, normal heart sounds and intact distal pulses.  Exam reveals no gallop and no friction rub.   No murmur heard. Pulmonary/Chest: Effort normal and breath sounds normal. No stridor. No respiratory distress. He has no wheezes. He has no rales. He exhibits no tenderness.  Abdominal:  Soft. Bowel sounds are normal. He exhibits no distension and no mass. There is no tenderness. There is no rebound and no guarding.  Musculoskeletal: He exhibits no edema or tenderness.  Lymphadenopathy:    He has no cervical adenopathy.  Neurological: He is oriented to person, place, and time.  Skin: Skin is warm and dry. He is not diaphoretic. No erythema. No pallor.  Psychiatric: Judgment normal. His mood appears not anxious. His speech is delayed and tangential. His speech is not rapid and/or pressured and not slurred. He is slowed. He is not aggressive, not withdrawn and not actively hallucinating. Cognition and memory are impaired. He does not exhibit a depressed mood. He expresses no homicidal and no suicidal ideation. He expresses no suicidal plans and no homicidal plans. He is communicative. He exhibits abnormal recent memory and abnormal remote memory. He is inattentive.  Vitals reviewed.   Lab Results  Component Value Date   WBC 7.9 09/20/2015   HGB 12.9 (L) 09/20/2015   HCT 38.4 (L) 09/20/2015   PLT 212.0 09/20/2015   GLUCOSE 92 09/20/2015   CHOL 158 09/20/2015   TRIG 147.0 09/20/2015   HDL 58.00 09/20/2015   LDLCALC 70 09/20/2015   ALT 13 09/20/2015   AST 16 09/20/2015   NA 145 09/20/2015   K 4.5 09/20/2015   CL 108 09/20/2015   CREATININE 0.93 09/20/2015   BUN 17 09/20/2015   CO2 24 09/20/2015   TSH 1.65 09/20/2015   PSA 1.36 11/21/2009   HGBA1C 5.7 08/11/2014    Ct Head Wo Contrast  Result Date: 09/03/2013 CLINICAL DATA:  Seizures, gait instability. EXAM: CT HEAD WITHOUT CONTRAST TECHNIQUE: Contiguous axial images were obtained from the base of the skull through the vertex without intravenous contrast. COMPARISON:  None. FINDINGS: Status post left temporal and parietal craniotomy. Ventriculoperitoneal shunt is seen entering right occipital skull with tip in right lateral ventricle. 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  Ventricular size is within normal limits. No mass effect or midline shift is noted. There is no evidence of hemorrhage, acute infarction or mass. IMPRESSION: Right occipital ventriculostomy noted with catheter tip in right lateral ventricle. Ventricular size is within normal limits. 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 No acute intracranial abnormality seen. Electronically Signed   By: Roque Lias M.D.   On: 09/03/2013 15:05    Assessment & Plan:   Daryll was seen today for hypertension and hyperlipidemia.  Diagnoses and all orders for this visit:  Essential hypertension- I will change nebivolol to carvedilol at his request, his blood pressure is well-controlled and his electrolytes and renal function are stable. -     CBC with Differential/Platelet; Future -     Comprehensive metabolic panel; Future -     carvedilol (COREG) 3.125 MG tablet; Take 1 tablet (3.125 mg total) by mouth 2 (two) times daily with a meal.  Hyperlipidemia with target LDL less than 130- he is doing well on the statin and is achieved his LDL goal -     Lipid panel; Future -     TSH; Future  Localization-related symptomatic epilepsy and epileptic  syndromes with complex partial seizures, not intractable, without status epilepticus (HCC)- he continues to be seizure free.  Dementia arising in the senium and presenium- this has been evaluated by neurology and there is no worthwhile treatment for this at his age.   I have discontinued Mr. Schuele nebivolol. I am also having him start on carvedilol. Additionally, I am having him maintain his brimonidine-timolol, bimatoprost, fluorometholone, dorzolamide, levETIRAcetam, valsartan-hydrochlorothiazide, and atorvastatin.  Meds ordered this encounter  Medications  . carvedilol (COREG) 3.125 MG tablet    Sig: Take 1 tablet (3.125 mg total) by mouth 2 (two) times daily with a meal.    Dispense:  180 tablet    Refill:  3      Follow-up: Return in about 6 months (around 03/22/2016).  Sanda Linger, MD

## 2015-10-06 ENCOUNTER — Other Ambulatory Visit: Payer: Self-pay | Admitting: Internal Medicine

## 2015-10-24 ENCOUNTER — Other Ambulatory Visit: Payer: Self-pay | Admitting: Internal Medicine

## 2015-10-25 ENCOUNTER — Other Ambulatory Visit: Payer: Self-pay | Admitting: *Deleted

## 2015-10-25 MED ORDER — VALSARTAN-HYDROCHLOROTHIAZIDE 160-12.5 MG PO TABS: 1.0000 | ORAL_TABLET | Freq: Every day | ORAL | 1 refills | Status: DC

## 2015-11-21 DIAGNOSIS — Z23 Encounter for immunization: Secondary | ICD-10-CM | POA: Diagnosis not present

## 2015-12-27 NOTE — Progress Notes (Deleted)
Subjective:   William Mills is a 81 y.o. male who presents for Medicare Annual/Subsequent preventive examination.  The Patient was informed that the wellness visit is to identify future health risk and educate and initiate measures that can reduce risk for increased disease through the lifespan.   Describes health as fair, good or great?   Review of Systems:  No ROS.  Medicare Wellness Visit.    Sleep patterns:    Home Safety/Smoke Alarms:   Living environment; residence and Firearm Safety:  Seat Belt Safety/Bike Helmet:    Counseling:   Eye Exam-  Dental-  Male:   CCS-Colonoscopy 09/26/08, normal.      PSA-Last 2011, 1.360      Objective:    Vitals: There were no vitals taken for this visit.  There is no height or weight on file to calculate BMI.  Tobacco History  Smoking Status  . Former Smoker  . Quit date: 12/30/2007  Smokeless Tobacco  . Never Used     Counseling given: Not Answered   Past Medical History:  Diagnosis Date  . Cerebrovascular accident (HCC)   . Dementia   . Head injury 1970   right frontal  . Hyperlipidemia   . Hypertension   . Seizure disorder [facility](HCC)    Past Surgical History:  Procedure Laterality Date  . release of right undescended testicle     No family history on file. History  Sexual Activity  . Sexual activity: No    Outpatient Encounter Prescriptions as of 12/28/2015  Medication Sig  . atorvastatin (LIPITOR) 20 MG tablet TAKE ONE TABLET BY MOUTH ONCE DAILY  . bimatoprost (LUMIGAN) 0.01 % SOLN INSTILL ONE DROP INTO LEFT EYE EVERY DAY AT BEDTIME  . brimonidine-timolol (COMBIGAN) 0.2-0.5 % ophthalmic solution Place 1 drop into the right eye 2 (two) times daily.  . carvedilol (COREG) 3.125 MG tablet Take 1 tablet (3.125 mg total) by mouth 2 (two) times daily with a meal.  . dorzolamide (TRUSOPT) 2 % ophthalmic solution Place 1 drop into both eyes 2 (two) times daily.  . fluorometholone (FML) 0.1 % ophthalmic suspension  Place 1 drop into the left eye 2 (two) times daily.  Marland Kitchen. levETIRAcetam (KEPPRA XR) 500 MG 24 hr tablet Take 2 tablets at bedtime  . valsartan-hydrochlorothiazide (DIOVAN-HCT) 160-12.5 MG tablet Take 1 tablet by mouth daily.   No facility-administered encounter medications on file as of 12/28/2015.     Activities of Daily Living No flowsheet data found.  Patient Care Team: Etta Grandchildhomas L Jones, MD as PCP - General   Assessment:    Physical assessment deferred to PCP.  Exercise Activities and Dietary recommendations   Diet (meal preparation, eat out, water intake, caffeinated beverages, dairy products, fruits and vegetables):   Breakfast: Lunch:  Dinner:      Goals    None     Fall Risk Fall Risk  10/21/2014 08/14/2014 08/11/2014 12/24/2012 12/24/2012  Falls in the past year? No No No No No  Risk for fall due to : Impaired vision - - - -   Depression Screen PHQ 2/9 Scores 08/14/2014 08/11/2014 12/24/2012 12/24/2012  PHQ - 2 Score 0 0 0 0    Cognitive Function MMSE - Mini Mental State Exam 10/21/2014 03/18/2014 12/03/2013  Orientation to time 5 5 5   Orientation to Place 4 5 4   Registration 3 3 3   Attention/ Calculation 5 5 1   Recall 2 3 1   Language- name 2 objects 1 2 2   Language- repeat  1 1 0  Language- follow 3 step command 2 2 2   Language- read & follow direction 1 1 1   Write a sentence 1 1 1   Copy design 1 1 1   Total score 26 29 21         Immunization History  Administered Date(s) Administered  . Influenza Split 12/10/2010  . Influenza, High Dose Seasonal PF 12/24/2012, 11/21/2015  . Influenza,inj,Quad PF,36+ Mos 10/19/2013  . Pneumococcal Conjugate-13 07/20/2013  . Pneumococcal Polysaccharide-23 08/21/2011  . Tdap 07/17/2010  . Zoster 08/11/2014   Screening Tests Health Maintenance  Topic Date Due  . TETANUS/TDAP  07/16/2020  . INFLUENZA VACCINE  Completed  . ZOSTAVAX  Completed  . PNA vac Low Risk Adult  Completed      Plan:     Continue to eat heart healthy  diet (full of fruits, vegetables, whole grains, lean protein, water--limit salt, fat, and sugar intake) and increase physical activity as tolerated.  Continue doing brain stimulating activities (puzzles, reading, adult coloring books, staying active) to keep memory sharp.   During the course of the visit the patient was educated and counseled about the following appropriate screening and preventive services:   Vaccines to include Pneumoccal, Influenza, Hepatitis B, Td, Zostavax, HCV  Cardiovascular Disease  Colorectal cancer screening  Diabetes screening  Prostate Cancer Screening  Glaucoma screening  Nutrition counseling   Smoking cessation counseling  Patient Instructions (the written plan) was given to the patient.    Alysia PennaKimberly R Alexius Ellington, RN  12/27/2015

## 2015-12-28 ENCOUNTER — Ambulatory Visit: Payer: Medicare Other

## 2016-01-16 ENCOUNTER — Emergency Department (HOSPITAL_COMMUNITY)
Admission: EM | Admit: 2016-01-16 | Discharge: 2016-01-16 | Disposition: A | Discharge status: Home / Self Care | Payer: Medicare Other | Attending: Emergency Medicine | Admitting: Emergency Medicine

## 2016-01-16 ENCOUNTER — Encounter (HOSPITAL_COMMUNITY): Payer: Self-pay | Admitting: Emergency Medicine

## 2016-01-16 DIAGNOSIS — R55 Syncope and collapse: Secondary | ICD-10-CM | POA: Diagnosis not present

## 2016-01-16 DIAGNOSIS — I10 Essential (primary) hypertension: Secondary | ICD-10-CM | POA: Diagnosis not present

## 2016-01-16 DIAGNOSIS — R001 Bradycardia, unspecified: Secondary | ICD-10-CM

## 2016-01-16 DIAGNOSIS — Z87891 Personal history of nicotine dependence: Secondary | ICD-10-CM | POA: Insufficient documentation

## 2016-01-16 DIAGNOSIS — W010XXA Fall on same level from slipping, tripping and stumbling without subsequent striking against object, initial encounter: Secondary | ICD-10-CM

## 2016-01-16 DIAGNOSIS — Y9301 Activity, walking, marching and hiking: Secondary | ICD-10-CM | POA: Diagnosis not present

## 2016-01-16 DIAGNOSIS — Z79899 Other long term (current) drug therapy: Secondary | ICD-10-CM | POA: Diagnosis not present

## 2016-01-16 DIAGNOSIS — Z8673 Personal history of transient ischemic attack (TIA), and cerebral infarction without residual deficits: Secondary | ICD-10-CM | POA: Insufficient documentation

## 2016-01-16 DIAGNOSIS — Y92481 Parking lot as the place of occurrence of the external cause: Secondary | ICD-10-CM | POA: Insufficient documentation

## 2016-01-16 DIAGNOSIS — W0110XA Fall on same level from slipping, tripping and stumbling with subsequent striking against unspecified object, initial encounter: Secondary | ICD-10-CM | POA: Diagnosis not present

## 2016-01-16 DIAGNOSIS — Y999 Unspecified external cause status: Secondary | ICD-10-CM | POA: Insufficient documentation

## 2016-01-16 DIAGNOSIS — R402411 Glasgow coma scale score 13-15, in the field [EMT or ambulance]: Secondary | ICD-10-CM | POA: Diagnosis not present

## 2016-01-16 NOTE — Discharge Instructions (Signed)
It was our pleasure to provide your ER care today - we hope that you feel better.  Your heart rate is mildly slow - stop your carvedilol/coreg medication.   Follow up with your primary care doctor in the coming week.  Return to ER if worse, new symptoms, weak/faint, recurrent falls, new or severe pain, fevers, other concern.

## 2016-01-16 NOTE — ED Notes (Signed)
Patient undressed, in gown, on monitor, continuous pulse oximetry and blood pressure cuff 

## 2016-01-16 NOTE — ED Triage Notes (Signed)
Pt arrives by [facility] after having witnessed fall at a [employer] parking lot with possible LOC. Pt states he knows he tripped over the sidewalk. Pt is alert and ox4 on arrival to ed. Ems reports pt being alert but disoriented to place on there arrival. Pt denies any pain.

## 2016-01-16 NOTE — ED Provider Notes (Signed)
[facility]-EMERGENCY DEPT Provider Note   CSN: [id number] Arrival date & time: 01/16/16  1159     History   Chief Complaint Chief Complaint  Patient presents with  . Loss of Consciousness    HPI William Mills is a 81 y.o. male.  Patient s/p fall while walking.  Patient states he is has limited vision at baseline, and so did not see small step/uneveness between parking lot and sidewalk and tripped. Denies loc. Denies feeling faint or dizzy prior to fall. No headache. Denies neck or back pain. States felt fine/normal, at baseline today.  Since fall, denies new pain or injury.    The history is provided by the patient.  Loss of Consciousness   Pertinent negatives include abdominal pain, back pain, chest pain, confusion, fever, headaches, light-headedness and weakness.    Past Medical History:  Diagnosis Date  . Cerebrovascular accident (HCC)   . Dementia   . Head injury 1970   right frontal  . Hyperlipidemia   . Hypertension   . Seizure disorder [facility](HCC)     Patient Active Problem List   Diagnosis Date Noted  . Gait instability 07/22/2014  . Localization-related symptomatic epilepsy and epileptic syndromes with complex partial seizures, not intractable, without status epilepticus (HCC) 12/03/2013  . Routine general medical examination at a health care facility 12/24/2012  . Hyperglycemia 08/21/2011  . Encounter for long-term (current) use of other medications 08/21/2011  . Hyperlipidemia with target LDL less than 130 11/21/2009  . Dementia arising in the senium and presenium 11/21/2009  . Other specified glaucoma 11/21/2009  . Essential hypertension 11/21/2009  . DEGENERATIVE JOINT DISEASE, BOTH KNEES, SEVERE 11/21/2009  . CEREBROVASCULAR ACCIDENT, HX OF 11/21/2009    Past Surgical History:  Procedure Laterality Date  . release of right undescended testicle         Home Medications    Prior to Admission medications   Medication Sig Start Date End Date Taking?  Authorizing Provider  atorvastatin (LIPITOR) 20 MG tablet TAKE ONE TABLET BY MOUTH ONCE DAILY 10/06/15   Etta Grandchildhomas L Jones, MD  bimatoprost (LUMIGAN) 0.01 % SOLN INSTILL ONE DROP INTO LEFT EYE EVERY DAY AT BEDTIME 04/02/11   Etta Grandchildhomas L Jones, MD  brimonidine-timolol (COMBIGAN) 0.2-0.5 % ophthalmic solution Place 1 drop into the right eye 2 (two) times daily. 04/02/11   Etta Grandchildhomas L Jones, MD  carvedilol (COREG) 3.125 MG tablet Take 1 tablet (3.125 mg total) by mouth 2 (two) times daily with a meal. 09/20/15   Etta Grandchildhomas L Jones, MD  dorzolamide (TRUSOPT) 2 % ophthalmic solution Place 1 drop into both eyes 2 (two) times daily. 03/13/15   Historical Provider, MD  fluorometholone (FML) 0.1 % ophthalmic suspension Place 1 drop into the left eye 2 (two) times daily.    Historical Provider, MD  levETIRAcetam (KEPPRA XR) 500 MG 24 hr tablet Take 2 tablets at bedtime 04/24/15   Van ClinesKaren M Aquino, MD  valsartan-hydrochlorothiazide (DIOVAN-HCT) 160-12.5 MG tablet Take 1 tablet by mouth daily. 10/25/15   Etta Grandchildhomas L Jones, MD    Family History No family history on file.  Social History Social History  Substance Use Topics  . Smoking status: Former Smoker    Quit date: 12/30/2007  . Smokeless tobacco: Never Used  . Alcohol use No     Allergies   Nifedipine; Carbamazepine; Penicillins; and Phenytoin   Review of Systems Review of Systems  Constitutional: Negative for fever.  HENT: Negative for sore throat.   Eyes: Negative for redness.  Respiratory:  Negative for shortness of breath.   Cardiovascular: Positive for syncope. Negative for chest pain.  Gastrointestinal: Negative for abdominal pain.  Genitourinary: Negative for flank pain.  Musculoskeletal: Negative for back pain and neck pain.  Skin: Negative for rash.  Neurological: Negative for syncope, weakness, light-headedness, numbness and headaches.  Hematological: Does not bruise/bleed easily.  Psychiatric/Behavioral: Negative for confusion.     Physical  Exam Updated Vital Signs BP 180/79 (BP Location: Right Arm)   Pulse (!) 53   Temp 97.7 F (36.5 C) (Oral)   Resp 16   SpO2 100%   Physical Exam  Constitutional: He appears well-developed and well-nourished. No distress.  HENT:  Head: Atraumatic.  Mouth/Throat: Oropharynx is clear and moist.  Eyes: Conjunctivae are normal.  Left eye chronic scarring, right pupil 3-4 mm, reactive.   Neck: Neck supple. No tracheal deviation present.  No bruit  Cardiovascular: Normal rate, normal heart sounds and intact distal pulses.   Pulmonary/Chest: Effort normal and breath sounds normal. No accessory muscle usage. No respiratory distress. He exhibits no tenderness.  Abdominal: Soft. Bowel sounds are normal. He exhibits no distension. There is no tenderness.  Musculoskeletal: He exhibits no edema.  CTLS spine, non tender, aligned, no step off. Good rom bil ext without pain or focal bony tenderness. Distal pulses palp bil.  Superficial abrasion right elbow.   Neurological: He is alert.  Alert, speech clear/fluent. Motor intact bil, stre 5/5. sens grossly intact.   Skin: Skin is warm and dry.  Psychiatric: He has a normal mood and affect.  Nursing note and vitals reviewed.    ED Treatments / Results  Labs (all labs ordered are listed, but only abnormal results are displayed) Labs Reviewed - No data to display  EKG  EKG Interpretation  Date/Time:  Tuesday January 16 2016 12:09:17 EST Ventricular Rate:  53 PR Interval:    QRS Duration: 108 QT Interval:  433 QTC Calculation: 407 R Axis:   31 Text Interpretation:  Sinus bradycardia No previous tracing Confirmed by Denton LankSTEINL  MD, Caryn BeeKEVIN ([id number]) on 01/16/2016 12:25:12 PM       Radiology No results found.  Procedures Procedures (including critical care time)  Medications Ordered in ED Medications - No data to display   Initial Impression / Assessment and Plan / ED Course  I have reviewed the triage vital signs and the nursing  notes.  Pertinent labs & imaging results that were available during my care of the patient were reviewed by me and considered in my medical decision making (see chart for details).  Clinical Course    Patient denies pain, no focal bony tenderness on exam.   Mild brady on monitor, sinus.  Is on b blocker. No faintness or dizziness.  Ambulates in ED with steady gait.  Tolerating po fluids. No new c/o.   Reviewed nursing notes and prior charts for additional history.   Patient states feels fine, requests d/c to home.      Final Clinical Impressions(s) / ED Diagnoses   Final diagnoses:  None    New Prescriptions New Prescriptions   No medications on file     Cathren LaineKevin Taariq Leitz, MD 01/16/16 1319

## 2016-01-22 ENCOUNTER — Encounter: Payer: Self-pay | Admitting: Nurse Practitioner

## 2016-01-22 ENCOUNTER — Ambulatory Visit (INDEPENDENT_AMBULATORY_CARE_PROVIDER_SITE_OTHER): Payer: Medicare Other | Admitting: Nurse Practitioner

## 2016-01-22 VITALS — BP 150/74 | HR 54 | Temp 98.0°F | Ht 74.0 in | Wt 231.0 lb

## 2016-01-22 DIAGNOSIS — I10 Essential (primary) hypertension: Secondary | ICD-10-CM

## 2016-01-22 DIAGNOSIS — W101XXA Fall (on)(from) sidewalk curb, initial encounter: Secondary | ICD-10-CM | POA: Diagnosis not present

## 2016-01-22 NOTE — Progress Notes (Signed)
Subjective:  Patient ID: William Mills, male    DOB: 06/30/1934  Age: 81 y.o. MRN: [id number]  CC: Follow-up (ED follow and BP med change. Jones PCP,last ov 09/2015. )   HPI He is in office today alone, he used public transport (bus) to get to office today. Reports his daughter moved to [place]. He lives in an apartment alone. He denies any need for assistance with ADLs or medication management.  HTN: William Mills presents today for blood pressure follow-up and medication reconciliation. [facility] Emergency department [facility]/16/2016 after a fall. During that visit he was advised to hold Coreg.  Fall: William Mills reports he tripped on the sidewalk because he was looking upwards and not paying attention. He denies any loss of LOC, or syncope or dizziness or change in mental status. He denies any pain during office visit.  Outpatient Medications Prior to Visit  Medication Sig Dispense Refill  . atorvastatin (LIPITOR) 20 MG tablet TAKE ONE TABLET BY MOUTH ONCE DAILY 90 tablet 1  . bimatoprost (LUMIGAN) 0.01 % SOLN INSTILL ONE DROP INTO LEFT EYE EVERY DAY AT BEDTIME 3 mL 5  . brimonidine-timolol (COMBIGAN) 0.2-0.5 % ophthalmic solution Place 1 drop into the right eye 2 (two) times daily. 5 mL 5  . dorzolamide (TRUSOPT) 2 % ophthalmic solution Place 1 drop into both eyes 2 (two) times daily.    . fluorometholone (FML) 0.1 % ophthalmic suspension Place 1 drop into the left eye 2 (two) times daily.    Marland Kitchen. levETIRAcetam (KEPPRA XR) 500 MG 24 hr tablet Take 2 tablets at bedtime 180 tablet 3  . valsartan-hydrochlorothiazide (DIOVAN-HCT) 160-12.5 MG tablet Take 1 tablet by mouth daily. 90 tablet 1  . carvedilol (COREG) 3.125 MG tablet Take 1 tablet (3.125 mg total) by mouth 2 (two) times daily with a meal. (Patient not taking: Reported on 01/22/2016) 180 tablet 3   No facility-administered medications prior to visit.     ROS See HPI  Objective:  BP (!) 150/74   Pulse (!)  54   Temp 98 F (36.7 C)   Ht 6\' 2"  (1.88 m)   Wt 231 lb (104.8 kg)   SpO2 98%   BMI 29.66 kg/m   BP Readings from Last 3 Encounters:  01/22/16 (!) 150/74  01/16/16 159/87  09/20/15 130/62    Wt Readings from Last 3 Encounters:  01/22/16 231 lb (104.8 kg)  09/20/15 226 lb (102.5 kg)  04/24/15 227 lb (103 kg)    Physical Exam  Constitutional: He is oriented to person, place, and time. No distress.  Cardiovascular: Normal rate, regular rhythm and normal heart sounds.   Pulmonary/Chest: Effort normal and breath sounds normal.  Musculoskeletal: He exhibits no edema.  Neurological: He is alert and oriented to person, place, and time.  Vitals reviewed.   Lab Results  Component Value Date   WBC 7.9 09/20/2015   HGB 12.9 (L) 09/20/2015   HCT 38.4 (L) 09/20/2015   PLT 212.0 09/20/2015   GLUCOSE 92 09/20/2015   CHOL 158 09/20/2015   TRIG 147.0 09/20/2015   HDL 58.00 09/20/2015   LDLCALC 70 09/20/2015   ALT 13 09/20/2015   AST 16 09/20/2015   NA 145 09/20/2015   K 4.5 09/20/2015   CL 108 09/20/2015   CREATININE 0.93 09/20/2015   BUN 17 09/20/2015   CO2 24 09/20/2015   TSH 1.65 09/20/2015   PSA 1.36 11/21/2009   HGBA1C 5.7 08/11/2014    No results found.  Assessment & Plan:   William Mills was seen today for follow-up.  Diagnoses and all orders for this visit:  Essential hypertension  Fall (on)(from) sidewalk curb, initial encounter   I am having William Mills maintain his brimonidine-timolol, bimatoprost, fluorometholone, dorzolamide, levETIRAcetam, carvedilol, atorvastatin, and valsartan-hydrochlorothiazide.  No orders of the defined types were placed in this encounter.   Follow-up: Return in about 1 month (around 02/22/2016) for HTN with Dr. Yetta BarreJones.  Alysia Pennaharlotte Sailor Hevia, NP

## 2016-01-22 NOTE — Progress Notes (Signed)
Pre visit review using our clinic review tool, if applicable. No additional management support is needed unless otherwise documented below in the visit note. 

## 2016-01-22 NOTE — Patient Instructions (Addendum)
Resume coreg 3.125mg  twice a day.  Continue Diovan-HCT once a day as prescribed.  Check BP at home and record. Bring BP readings to next office visit.

## 2016-01-25 ENCOUNTER — Ambulatory Visit: Payer: Medicare Other | Admitting: Internal Medicine

## 2016-02-21 ENCOUNTER — Encounter: Payer: Self-pay | Admitting: Internal Medicine

## 2016-02-21 ENCOUNTER — Ambulatory Visit (INDEPENDENT_AMBULATORY_CARE_PROVIDER_SITE_OTHER): Payer: Medicare Other | Admitting: Internal Medicine

## 2016-02-21 VITALS — BP 130/80 | HR 67 | Temp 97.5°F | Ht 74.0 in | Wt 234.0 lb

## 2016-02-21 DIAGNOSIS — I10 Essential (primary) hypertension: Secondary | ICD-10-CM | POA: Diagnosis not present

## 2016-02-21 NOTE — Progress Notes (Signed)
Pre visit review using our clinic review tool, if applicable. No additional management support is needed unless otherwise documented below in the visit note. 

## 2016-02-21 NOTE — Progress Notes (Signed)
Subjective:  Patient ID: William Mills, male    DOB: 01/27/1935  Age: 82 y.o. MRN: [id number]  CC: Hypertension   HPI William PilaDonald Gossen presents for a blood pressure check. He feels well and offers no complaints. He was seen a few weeks ago and his blood pressure was not adequately well controlled so carvedilol was added to the ARB and hydrochlorothiazide. He tells me his blood pressure at home has been well controlled since then.  Outpatient Medications Prior to Visit  Medication Sig Dispense Refill  . atorvastatin (LIPITOR) 20 MG tablet TAKE ONE TABLET BY MOUTH ONCE DAILY 90 tablet 1  . bimatoprost (LUMIGAN) 0.01 % SOLN INSTILL ONE DROP INTO LEFT EYE EVERY DAY AT BEDTIME 3 mL 5  . brimonidine-timolol (COMBIGAN) 0.2-0.5 % ophthalmic solution Place 1 drop into the right eye 2 (two) times daily. 5 mL 5  . carvedilol (COREG) 3.125 MG tablet Take 1 tablet (3.125 mg total) by mouth 2 (two) times daily with a meal. 180 tablet 3  . dorzolamide (TRUSOPT) 2 % ophthalmic solution Place 1 drop into both eyes 2 (two) times daily.    Marland Kitchen. levETIRAcetam (KEPPRA XR) 500 MG 24 hr tablet Take 2 tablets at bedtime 180 tablet 3  . valsartan-hydrochlorothiazide (DIOVAN-HCT) 160-12.5 MG tablet Take 1 tablet by mouth daily. 90 tablet 1  . fluorometholone (FML) 0.1 % ophthalmic suspension Place 1 drop into the left eye 2 (two) times daily.     No facility-administered medications prior to visit.     ROS Review of Systems  Constitutional: Negative for diaphoresis and fatigue.  HENT: Negative.   Eyes: Negative.   Respiratory: Negative for cough, chest tightness and shortness of breath.   Cardiovascular: Negative for chest pain, palpitations and leg swelling.  Gastrointestinal: Negative for abdominal pain, constipation, diarrhea, nausea and vomiting.  Endocrine: Negative.   Genitourinary: Negative.  Negative for difficulty urinating and dysuria.  Musculoskeletal: Negative.  Negative for back pain, myalgias  and neck pain.  Skin: Negative.  Negative for color change and rash.  Allergic/Immunologic: Negative.   Neurological: Negative for seizures, syncope, weakness and headaches.  Hematological: Negative for adenopathy. Does not bruise/bleed easily.  Psychiatric/Behavioral: Negative.     Objective:  BP 130/80 (BP Location: Left Arm, Patient Position: Sitting, Cuff Size: Normal)   Pulse 67   Temp 97.5 F (36.4 C) (Oral)   Ht 6\' 2"  (1.88 m)   Wt 234 lb (106.1 kg)   SpO2 (!) 89%   BMI 30.04 kg/m   BP Readings from Last 3 Encounters:  02/21/16 130/80  01/22/16 (!) 150/74  01/16/16 159/87    Wt Readings from Last 3 Encounters:  02/21/16 234 lb (106.1 kg)  01/22/16 231 lb (104.8 kg)  09/20/15 226 lb (102.5 kg)    Physical Exam  Constitutional: He is oriented to person, place, and time. No distress.  HENT:  Mouth/Throat: Oropharynx is clear and moist. No oropharyngeal exudate.  Eyes: Conjunctivae are normal. Right eye exhibits no discharge. Left eye exhibits no discharge. No scleral icterus.  Neck: Normal range of motion. Neck supple. No JVD present. No tracheal deviation present. No thyromegaly present.  Cardiovascular: Normal rate, regular rhythm, normal heart sounds and intact distal pulses.  Exam reveals no gallop and no friction rub.   No murmur heard. Pulmonary/Chest: Effort normal and breath sounds normal. No stridor. No respiratory distress. He has no wheezes. He has no rales. He exhibits no tenderness.  Abdominal: Soft. Bowel sounds are normal. He exhibits no  distension and no mass. There is no tenderness. There is no rebound and no guarding.  Musculoskeletal: Normal range of motion. He exhibits no edema, tenderness or deformity.  Lymphadenopathy:    He has no cervical adenopathy.  Neurological: He is oriented to person, place, and time.  Skin: Skin is warm and dry. No rash noted. He is not diaphoretic. No erythema. No pallor.  Vitals reviewed.   Lab Results  Component  Value Date   WBC 7.9 09/20/2015   HGB 12.9 (L) 09/20/2015   HCT 38.4 (L) 09/20/2015   PLT 212.0 09/20/2015   GLUCOSE 92 09/20/2015   CHOL 158 09/20/2015   TRIG 147.0 09/20/2015   HDL 58.00 09/20/2015   LDLCALC 70 09/20/2015   ALT 13 09/20/2015   AST 16 09/20/2015   NA 145 09/20/2015   K 4.5 09/20/2015   CL 108 09/20/2015   CREATININE 0.93 09/20/2015   BUN 17 09/20/2015   CO2 24 09/20/2015   TSH 1.65 09/20/2015   PSA 1.36 11/21/2009   HGBA1C 5.7 08/11/2014    No results found.  Assessment & Plan:   Calab was seen today for hypertension.  Diagnoses and all orders for this visit:  Essential hypertension- his blood pressure is well-controlled and he has no side effects from the combination of an alpha/beta blocker, ARB, and thiazide diuretic. Will continue this regimen.   I have discontinued Mr. Marrow fluorometholone. I am also having him maintain his brimonidine-timolol, bimatoprost, dorzolamide, levETIRAcetam, carvedilol, atorvastatin, and valsartan-hydrochlorothiazide.  No orders of the defined types were placed in this encounter.    Follow-up: Return in about 6 months (around 08/20/2016).  Sanda Linger, MD

## 2016-02-21 NOTE — Patient Instructions (Signed)
Hypertension Hypertension, commonly called high blood pressure, is when the force of blood pumping through your arteries is too strong. Your arteries are the blood vessels that carry blood from your heart throughout your body. A blood pressure reading consists of a higher number over a lower number, such as 110/72. The higher number (systolic) is the pressure inside your arteries when your heart pumps. The lower number (diastolic) is the pressure inside your arteries when your heart relaxes. Ideally you want your blood pressure below 120/80. Hypertension forces your heart to work harder to pump blood. Your arteries may become narrow or stiff. Having untreated or uncontrolled hypertension can cause heart attack, stroke, kidney disease, and other problems. What increases the risk? Some risk factors for high blood pressure are controllable. Others are not. Risk factors you cannot control include:  Race. You may be at higher risk if you are African American.  Age. Risk increases with age.  Gender. Men are at higher risk than women before age 45 years. After age 65, women are at higher risk than men. Risk factors you can control include:  Not getting enough exercise or physical activity.  Being overweight.  Getting too much fat, sugar, calories, or salt in your diet.  Drinking too much alcohol. What are the signs or symptoms? Hypertension does not usually cause signs or symptoms. Extremely high blood pressure (hypertensive crisis) may cause headache, anxiety, shortness of breath, and nosebleed. How is this diagnosed? To check if you have hypertension, your health care provider will measure your blood pressure while you are seated, with your arm held at the level of your heart. It should be measured at least twice using the same arm. Certain conditions can cause a difference in blood pressure between your right and left arms. A blood pressure reading that is higher than normal on one occasion does  not mean that you need treatment. If it is not clear whether you have high blood pressure, you may be asked to return on a different day to have your blood pressure checked again. Or, you may be asked to monitor your blood pressure at home for 1 or more weeks. How is this treated? Treating high blood pressure includes making lifestyle changes and possibly taking medicine. Living a healthy lifestyle can help lower high blood pressure. You may need to change some of your habits. Lifestyle changes may include:  Following the DASH diet. This diet is high in fruits, vegetables, and whole grains. It is low in salt, red meat, and added sugars.  Keep your sodium intake below 2,300 mg per day.  Getting at least 30-45 minutes of aerobic exercise at least 4 times per week.  Losing weight if necessary.  Not smoking.  Limiting alcoholic beverages.  Learning ways to reduce stress. Your health care provider may prescribe medicine if lifestyle changes are not enough to get your blood pressure under control, and if one of the following is true:  You are 18-59 years of age and your systolic blood pressure is above 140.  You are 60 years of age or older, and your systolic blood pressure is above 150.  Your diastolic blood pressure is above 90.  You have diabetes, and your systolic blood pressure is over 140 or your diastolic blood pressure is over 90.  You have kidney disease and your blood pressure is above 140/90.  You have heart disease and your blood pressure is above 140/90. Your personal target blood pressure may vary depending on your medical   conditions, your age, and other factors. Follow these instructions at home:  Have your blood pressure rechecked as directed by your health care provider.  Take medicines only as directed by your health care provider. Follow the directions carefully. Blood pressure medicines must be taken as prescribed. The medicine does not work as well when you skip  doses. Skipping doses also puts you at risk for problems.  Do not smoke.  Monitor your blood pressure at home as directed by your health care provider. Contact a health care provider if:  You think you are having a reaction to medicines taken.  You have recurrent headaches or feel dizzy.  You have swelling in your ankles.  You have trouble with your vision. Get help right away if:  You develop a severe headache or confusion.  You have unusual weakness, numbness, or feel faint.  You have severe chest or abdominal pain.  You vomit repeatedly.  You have trouble breathing. This information is not intended to replace advice given to you by your health care provider. Make sure you discuss any questions you have with your health care provider. Document Released: 02/04/2005 Document Revised: 07/13/2015 Document Reviewed: 11/27/2012 Elsevier Interactive Patient Education  2017 Elsevier Inc.  

## 2016-04-09 ENCOUNTER — Other Ambulatory Visit: Payer: Self-pay | Admitting: Internal Medicine

## 2016-04-22 ENCOUNTER — Other Ambulatory Visit: Payer: Self-pay | Admitting: Neurology

## 2016-04-22 ENCOUNTER — Other Ambulatory Visit: Payer: Self-pay | Admitting: Internal Medicine

## 2016-04-22 DIAGNOSIS — G40209 Localization-related (focal) (partial) symptomatic epilepsy and epileptic syndromes with complex partial seizures, not intractable, without status epilepticus: Secondary | ICD-10-CM

## 2016-04-24 ENCOUNTER — Encounter: Payer: Self-pay | Admitting: Neurology

## 2016-04-24 ENCOUNTER — Ambulatory Visit (INDEPENDENT_AMBULATORY_CARE_PROVIDER_SITE_OTHER): Payer: Medicare Other | Admitting: Neurology

## 2016-04-24 VITALS — BP 122/82 | HR 62 | Ht 74.0 in | Wt 232.0 lb

## 2016-04-24 DIAGNOSIS — G3184 Mild cognitive impairment, so stated: Secondary | ICD-10-CM | POA: Diagnosis not present

## 2016-04-24 DIAGNOSIS — G40209 Localization-related (focal) (partial) symptomatic epilepsy and epileptic syndromes with complex partial seizures, not intractable, without status epilepticus: Secondary | ICD-10-CM | POA: Diagnosis not present

## 2016-04-24 MED ORDER — LEVETIRACETAM ER 500 MG PO TB24: 1000.0000 mg | ORAL_TABLET | Freq: Every day | ORAL | 3 refills | Status: DC

## 2016-04-24 NOTE — Patient Instructions (Addendum)
1. Continue Levetiracetam ER 500mg : Take 2 tablets daily 2. Continue home safety 3. Follow-up in 1 year, call for any changes
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
## 2016-07-30 DIAGNOSIS — H401493 Capsular glaucoma with pseudoexfoliation of lens, unspecified eye, severe stage: Secondary | ICD-10-CM | POA: Diagnosis not present

## 2016-09-09 ENCOUNTER — Other Ambulatory Visit: Payer: Self-pay | Admitting: Internal Medicine

## 2016-09-09 DIAGNOSIS — I10 Essential (primary) hypertension: Secondary | ICD-10-CM

## 2016-10-23 ENCOUNTER — Other Ambulatory Visit: Payer: Self-pay | Admitting: Internal Medicine

## 2016-10-24 ENCOUNTER — Other Ambulatory Visit: Payer: Self-pay | Admitting: Internal Medicine

## 2016-10-24 DIAGNOSIS — I10 Essential (primary) hypertension: Secondary | ICD-10-CM

## 2016-10-24 MED ORDER — IRBESARTAN-HYDROCHLOROTHIAZIDE 150-12.5 MG PO TABS: 1.0000 | ORAL_TABLET | Freq: Every day | ORAL | 1 refills | Status: DC

## 2016-11-20 DIAGNOSIS — H401493 Capsular glaucoma with pseudoexfoliation of lens, unspecified eye, severe stage: Secondary | ICD-10-CM | POA: Diagnosis not present

## 2016-11-26 DIAGNOSIS — Z23 Encounter for immunization: Secondary | ICD-10-CM | POA: Diagnosis not present

## 2016-12-20 ENCOUNTER — Other Ambulatory Visit: Payer: Self-pay | Admitting: Internal Medicine

## 2016-12-20 DIAGNOSIS — I10 Essential (primary) hypertension: Secondary | ICD-10-CM

## 2017-03-24 ENCOUNTER — Other Ambulatory Visit: Payer: Self-pay | Admitting: Internal Medicine

## 2017-03-24 DIAGNOSIS — I10 Essential (primary) hypertension: Secondary | ICD-10-CM

## 2017-04-01 ENCOUNTER — Telehealth: Payer: Self-pay | Admitting: Internal Medicine

## 2017-04-01 DIAGNOSIS — I10 Essential (primary) hypertension: Secondary | ICD-10-CM

## 2017-04-01 MED ORDER — IRBESARTAN-HYDROCHLOROTHIAZIDE 150-12.5 MG PO TABS: 1.0000 | ORAL_TABLET | Freq: Every day | ORAL | 1 refills | Status: DC

## 2017-04-01 NOTE — Telephone Encounter (Signed)
Copied from CRM [id number]. Topic: General - Other >> Apr 01, 2017  1:34 PM Stephannie LiSimmons, Khanh Cordner L, NT wrote: Reason for CRM: Patient said he is out  of his blood pressure medication and is unsure of which medication , please advise  [facility] the [facility]  [place] [address] [place]  [phone]

## 2017-04-03 ENCOUNTER — Other Ambulatory Visit: Payer: Self-pay | Admitting: Internal Medicine

## 2017-04-03 DIAGNOSIS — I10 Essential (primary) hypertension: Secondary | ICD-10-CM

## 2017-04-15 ENCOUNTER — Other Ambulatory Visit (INDEPENDENT_AMBULATORY_CARE_PROVIDER_SITE_OTHER): Payer: Medicare Other

## 2017-04-15 ENCOUNTER — Ambulatory Visit (INDEPENDENT_AMBULATORY_CARE_PROVIDER_SITE_OTHER): Payer: Medicare Other | Admitting: Internal Medicine

## 2017-04-15 ENCOUNTER — Encounter: Payer: Self-pay | Admitting: Internal Medicine

## 2017-04-15 VITALS — BP 140/70 | HR 60 | Temp 97.9°F | Resp 16 | Ht 74.0 in | Wt 222.0 lb

## 2017-04-15 DIAGNOSIS — F0390 Unspecified dementia without behavioral disturbance: Secondary | ICD-10-CM

## 2017-04-15 DIAGNOSIS — E785 Hyperlipidemia, unspecified: Secondary | ICD-10-CM

## 2017-04-15 DIAGNOSIS — I10 Essential (primary) hypertension: Secondary | ICD-10-CM

## 2017-04-15 DIAGNOSIS — D539 Nutritional anemia, unspecified: Secondary | ICD-10-CM | POA: Insufficient documentation

## 2017-04-15 DIAGNOSIS — R739 Hyperglycemia, unspecified: Secondary | ICD-10-CM

## 2017-04-15 DIAGNOSIS — R2681 Unsteadiness on feet: Secondary | ICD-10-CM | POA: Diagnosis not present

## 2017-04-15 LAB — COMPREHENSIVE METABOLIC PANEL
ALT: 13 U/L (ref 0–53)
AST: 13 U/L (ref 0–37)
Albumin: 4.1 g/dL (ref 3.5–5.2)
Alkaline Phosphatase: 70 U/L (ref 39–117)
BUN: 20 mg/dL (ref 6–23)
CALCIUM: 10.1 mg/dL (ref 8.4–10.5)
CHLORIDE: 102 meq/L (ref 96–112)
CO2: 32 meq/L (ref 19–32)
CREATININE: 0.89 mg/dL (ref 0.40–1.50)
GFR: 87.27 mL/min (ref >60.00)
GLUCOSE: 106 mg/dL — AB (ref 70–99)
Potassium: 4.1 mEq/L (ref 3.5–5.1)
Sodium: 141 mEq/L (ref 135–145)
Total Bilirubin: 0.8 mg/dL (ref 0.2–1.2)
Total Protein: 7.6 g/dL (ref 6.0–8.3)

## 2017-04-15 LAB — TSH: TSH: 2.75 u[IU]/mL (ref 0.35–4.50)

## 2017-04-15 LAB — CBC WITH DIFFERENTIAL/PLATELET
BASOS PCT: 0.5 % (ref 0.0–3.0)
Basophils Absolute: 0 10*3/uL (ref 0.0–0.1)
EOS ABS: 0.2 10*3/uL (ref 0.0–0.7)
Eosinophils Relative: 2.8 % (ref 0.0–5.0)
HEMATOCRIT: 43.9 % (ref 39.0–52.0)
Hemoglobin: 14.6 g/dL (ref 13.0–17.0)
LYMPHS ABS: 2.4 10*3/uL (ref 0.7–4.0)
LYMPHS PCT: 36.6 % (ref 12.0–46.0)
MCHC: 33.3 g/dL (ref 30.0–36.0)
MCV: 87.5 fl (ref 78.0–100.0)
MONO ABS: 0.3 10*3/uL (ref 0.1–1.0)
Monocytes Relative: 4.6 % (ref 3.0–12.0)
NEUTROS ABS: 3.6 10*3/uL (ref 1.4–7.7)
NEUTROS PCT: 55.5 % (ref 43.0–77.0)
PLATELETS: 251 10*3/uL (ref 150.0–400.0)
RBC: 5.02 Mil/uL (ref 4.22–5.81)
RDW: 14.7 % (ref 11.5–15.5)
WBC: 6.4 10*3/uL (ref 4.0–10.5)

## 2017-04-15 LAB — LIPID PANEL
CHOLESTEROL: 162 mg/dL (ref 0–200)
HDL: 58.1 mg/dL (ref >39.00)
LDL Cholesterol: 79 mg/dL (ref 0–99)
NonHDL: 103.54
TRIGLYCERIDES: 122 mg/dL (ref 0.0–149.0)
Total CHOL/HDL Ratio: 3
VLDL: 24.4 mg/dL (ref 0.0–40.0)

## 2017-04-15 LAB — FOLATE: Folate: >23.6 ng/mL (ref >5.9)

## 2017-04-15 LAB — FERRITIN: FERRITIN: 98.9 ng/mL (ref 22.0–322.0)

## 2017-04-15 LAB — IBC PANEL
IRON: 67 ug/dL (ref 42–165)
SATURATION RATIOS: 18.1 % — AB (ref 20.0–50.0)
TRANSFERRIN: 265 mg/dL (ref 212.0–360.0)

## 2017-04-15 LAB — VITAMIN B12: VITAMIN B 12: 410 pg/mL (ref 211–911)

## 2017-04-15 NOTE — Patient Instructions (Signed)

## 2017-04-15 NOTE — Progress Notes (Signed)
Subjective:  Patient ID: Dawn Kiper, male    DOB: 02/17/1935  Age: 83 y.o. MRN: [id number]  CC: Anemia and Hypertension   HPI Reshad Saab presents for f/up - It is difficult to obtain a history from him due to his age and dementia but he complains that he is having trouble getting around his house.  He tells a story recently where he got into the tub and then had a hard time getting out.  He said he was eventually able to crawl out of the tub and raise himself up by grabbing onto the toilet.  He states in general his arms or legs just feel more weak and uncoordinated.  He does not offer any focal complaints.  He tells me he has not recently had any seizure activity.  Outpatient Medications Prior to Visit  Medication Sig Dispense Refill  . atorvastatin (LIPITOR) 20 MG tablet TAKE 1 TABLET BY MOUTH ONCE DAILY 90 tablet 1  . bimatoprost (LUMIGAN) 0.01 % SOLN INSTILL ONE DROP INTO LEFT EYE EVERY DAY AT BEDTIME 3 mL 5  . brimonidine-timolol (COMBIGAN) 0.2-0.5 % ophthalmic solution Place 1 drop into the right eye 2 (two) times daily. 5 mL 5  . carvedilol (COREG) 3.125 MG tablet TAKE 1 TABLET BY MOUTH TWICE DAILY WITH A MEAL 180 tablet 0  . dorzolamide (TRUSOPT) 2 % ophthalmic solution Place 1 drop into both eyes 2 (two) times daily.    . irbesartan-hydrochlorothiazide (AVALIDE) 150-12.5 MG tablet Take 1 tablet by mouth daily. 90 tablet 1  . levETIRAcetam (KEPPRA XR) 500 MG 24 hr tablet Take 2 tablets (1,000 mg total) by mouth at bedtime. 180 tablet 3   No facility-administered medications prior to visit.     ROS Review of Systems  Constitutional: Positive for activity change and unexpected weight change (10 # wt loss). Negative for appetite change, chills, diaphoresis and fatigue.  HENT: Negative.  Negative for trouble swallowing.   Eyes: Negative.   Respiratory: Negative.  Negative for cough, chest tightness, shortness of breath and wheezing.   Cardiovascular: Negative for chest  pain, palpitations and leg swelling.  Gastrointestinal: Negative for abdominal pain, constipation, diarrhea, nausea and vomiting.  Endocrine: Negative.   Genitourinary: Negative for decreased urine volume, difficulty urinating, dysuria, frequency and urgency.  Musculoskeletal: Positive for gait problem. Negative for arthralgias, back pain, myalgias and neck pain.  Skin: Negative for color change and pallor.  Allergic/Immunologic: Negative.   Neurological: Positive for weakness. Negative for dizziness, syncope, light-headedness, numbness and headaches.  Hematological: Negative for adenopathy. Does not bruise/bleed easily.  Psychiatric/Behavioral: Negative.     Objective:  BP 140/70 (BP Location: Left Arm, Patient Position: Sitting, Cuff Size: Large)   Pulse 60   Temp 97.9 F (36.6 C) (Oral)   Ht 6\' 2"  (1.88 m)   Wt 222 lb (100.7 kg)   SpO2 98%   BMI 28.50 kg/m   BP Readings from Last 3 Encounters:  04/15/17 140/70  04/24/16 122/82  02/21/16 130/80    Wt Readings from Last 3 Encounters:  04/15/17 222 lb (100.7 kg)  04/24/16 232 lb (105.2 kg)  02/21/16 234 lb (106.1 kg)    Physical Exam  Constitutional: No distress.  HENT:  Mouth/Throat: Oropharynx is clear and moist. No oropharyngeal exudate.  Eyes: Conjunctivae are normal. Left eye exhibits no discharge. No scleral icterus.  Neck: Normal range of motion. Neck supple. No JVD present. No thyromegaly present.  Cardiovascular: Normal rate, regular rhythm and normal heart sounds. Exam reveals  no gallop and no friction rub.  No murmur heard. Pulmonary/Chest: Effort normal and breath sounds normal. No respiratory distress. He has no wheezes. He has no rales.  Abdominal: Soft. Bowel sounds are normal. He exhibits no distension and no mass. There is no tenderness. There is no guarding.  Musculoskeletal: Normal range of motion. He exhibits no edema, tenderness or deformity.  Lymphadenopathy:    He has no cervical adenopathy.    Neurological: He is alert. He displays atrophy. He displays no tremor and normal reflexes. No cranial nerve deficit or sensory deficit. He exhibits abnormal muscle tone. He displays no seizure activity. Coordination and gait abnormal.  Reflex Scores:      Tricep reflexes are 1+ on the right side and 1+ on the left side.      Bicep reflexes are 1+ on the right side and 1+ on the left side.      Brachioradialis reflexes are 1+ on the right side and 1+ on the left side.      Patellar reflexes are 1+ on the right side and 1+ on the left side.      Achilles reflexes are 1+ on the right side and 1+ on the left side. There is bilateral, symmetrical, generalized weakness and atrophy.  Gait is mildly ataxic.  There are no focal findings today.  Skin: Skin is dry. No rash noted. He is not diaphoretic. No erythema. No pallor.  Psychiatric: Judgment normal. His mood appears not anxious. His speech is delayed and tangential. He is slowed and withdrawn. He is not agitated, not aggressive and not actively hallucinating. Cognition and memory are impaired. He does not exhibit a depressed mood. He expresses no homicidal and no suicidal ideation. He exhibits abnormal recent memory and abnormal remote memory. He is inattentive.  Vitals reviewed.   Lab Results  Component Value Date   WBC 6.4 04/15/2017   HGB 14.6 04/15/2017   HCT 43.9 04/15/2017   PLT 251.0 04/15/2017   GLUCOSE 106 (H) 04/15/2017   CHOL 162 04/15/2017   TRIG 122.0 04/15/2017   HDL 58.10 04/15/2017   LDLCALC 79 04/15/2017   ALT 13 04/15/2017   AST 13 04/15/2017   NA 141 04/15/2017   K 4.1 04/15/2017   CL 102 04/15/2017   CREATININE 0.89 04/15/2017   BUN 20 04/15/2017   CO2 32 04/15/2017   TSH 2.75 04/15/2017   TSH 2.27 04/15/2017   PSA 1.36 11/21/2009   HGBA1C 5.7 08/11/2014    No results found.  Assessment & Plan:   Dorinda HillDonald was seen today for anemia and hypertension.  Diagnoses and all orders for this visit:  Essential  hypertension-his blood pressures well controlled.  Electrolytes and renal function are normal. -     Comprehensive metabolic panel; Future -     CBC with Differential/Platelet; Future -     TSH; Future  Hyperglycemia-provement noted -     Comprehensive metabolic panel; Future  Hyperlipidemia with target LDL less than 130- I am concerned the statin may be adversely affecting him so I have asked him to stop taking it. -     Lipid panel; Future -     Thyroid Panel With TSH; Future -     TSH; Future  Dementia arising in the senium and presenium -     [facility] -     Ambulatory referral to Home Health  Gait instability -     [facility]  Ambulatory referral to Home Health  Deficiency anemia- His H&H are normal now.  I will screen for vitamin deficiencies. -     CBC with Differential/Platelet; Future -     Folate; Future -     Ferritin; Future -     IBC panel; Future -     Vitamin B12; Future -     Vitamin B1; Future   I am having Oliver Pila maintain his brimonidine-timolol, bimatoprost, dorzolamide, levETIRAcetam, atorvastatin, irbesartan-hydrochlorothiazide, and carvedilol.  No orders of the defined types were placed in this encounter.    Follow-up: Return in about 6 months (around 10/13/2017).  Sanda Linger, MD

## 2017-04-17 ENCOUNTER — Telehealth: Payer: Self-pay | Admitting: Internal Medicine

## 2017-04-17 DIAGNOSIS — F0390 Unspecified dementia without behavioral disturbance: Secondary | ICD-10-CM | POA: Diagnosis not present

## 2017-04-17 DIAGNOSIS — I10 Essential (primary) hypertension: Secondary | ICD-10-CM | POA: Diagnosis not present

## 2017-04-17 NOTE — Telephone Encounter (Unsigned)
Copied from CRM [id number]. Topic: Quick Communication - See Telephone Encounter >> Apr 17, 2017  2:02 PM Floria RavelingStovall, Shana A wrote: CRM for notification. See Telephone encounter for:  Harlin RainShraddha from [facility] [phone] Need verbals to add social worker and OT     04/17/17.

## 2017-04-17 NOTE — Telephone Encounter (Signed)
This is not the right number.

## 2017-04-19 LAB — THYROID PANEL WITH TSH
Free Thyroxine Index: 2.3 (ref 1.4–3.8)
T3 Uptake: 25 % (ref 22–35)
T4, Total: 9.2 ug/dL (ref 4.9–10.5)
TSH: 2.27 mIU/L (ref 0.40–4.50)

## 2017-04-19 LAB — VITAMIN B1: VITAMIN B1 (THIAMINE): 10 nmol/L (ref 8–30)

## 2017-04-21 DIAGNOSIS — F0390 Unspecified dementia without behavioral disturbance: Secondary | ICD-10-CM | POA: Diagnosis not present

## 2017-04-21 DIAGNOSIS — I10 Essential (primary) hypertension: Secondary | ICD-10-CM | POA: Diagnosis not present

## 2017-04-22 ENCOUNTER — Other Ambulatory Visit: Payer: Self-pay | Admitting: *Deleted

## 2017-04-22 ENCOUNTER — Encounter: Payer: Self-pay | Admitting: *Deleted

## 2017-04-22 DIAGNOSIS — F0390 Unspecified dementia without behavioral disturbance: Secondary | ICD-10-CM | POA: Diagnosis not present

## 2017-04-22 DIAGNOSIS — I10 Essential (primary) hypertension: Secondary | ICD-10-CM | POA: Diagnosis not present

## 2017-04-22 NOTE — Patient Outreach (Signed)
[facility] Care Management  04/22/2017  William PilaDonald Mills 04/06/1934 [id number]  Referral from MD office; reason-history of stroke & gait instability; dx-Stroke/TIA; patient is single with limited support. Medicare coverage.  Telephone call to patient; left HIPPA compliant voice mail requesting return call.  Plan: Furniture conservator/restorerend outreach letter. Follow up in 2-4 days.  Colleen CanLinda Josseline Reddin, RN BSN CCM Care Management Coordinator [facility]  [phone]

## 2017-04-25 ENCOUNTER — Other Ambulatory Visit: Payer: Self-pay | Admitting: *Deleted

## 2017-04-25 NOTE — Patient Outreach (Signed)
[facility](THN) Care Management  04/25/2017  William PilaDonald Mills 09/04/1934 [id number]  Referral from MD office; reason-history of stroke & gait instability; dx-Stroke/TIA; dementia. Patient is single with limited support. Medicare coverage.  Telephone call x 2 to patient; states he is hard of hearing.  RN care coordinator raised volume of voice & patient advised that he was able to hear. Patient was advised of referral from MD and [facility] care [facility] services. Patient answering some of questions very slowly. Appears that he was thinking about what he wanted to say & getting the words together.  He was unable to answer some of assessment questions due he didn't know or couldn't remember.  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    States he manages he own medicines & takes daily as ordered by MD. Voices that  [facility] which is several blocks from where he lives.  States he rides city bus to MD appointments. States he walks 10 miles daily .  Voices he lives alone & cooks his own meals. States eats twice daily. States he has good appetite.   Voices that he walks slowly and does not use cane or walker.  Office notes on 04/15/17-state patient has abnormal gait & coordination; has abnormal cognition and memory is impaired.    [facility].   Assessment:  Patient needs in home assessment/evaluation to determine care needs.  [facility].  Patient is hard of hearing. Patient has memory deficit.(hx stroke) Patient has gait instability. Office notes state pt had 10 lb weight loss since last seen. Patient lives alone. Blind in one eye. Falls risk.    Plan: Refer to care management assistant to assign community care coordinator.   Colleen CanLinda  Tashayla Therien, RN BSN CCM Care Management Coordinator [facility]  [phone]

## 2017-04-26 DIAGNOSIS — F0390 Unspecified dementia without behavioral disturbance: Secondary | ICD-10-CM | POA: Diagnosis not present

## 2017-04-26 DIAGNOSIS — H409 Unspecified glaucoma: Secondary | ICD-10-CM | POA: Diagnosis not present

## 2017-04-26 DIAGNOSIS — R634 Abnormal weight loss: Secondary | ICD-10-CM | POA: Diagnosis not present

## 2017-04-26 DIAGNOSIS — R739 Hyperglycemia, unspecified: Secondary | ICD-10-CM | POA: Diagnosis not present

## 2017-04-26 DIAGNOSIS — M170 Bilateral primary osteoarthritis of knee: Secondary | ICD-10-CM | POA: Diagnosis not present

## 2017-04-26 DIAGNOSIS — H5462 Unqualified visual loss, left eye, normal vision right eye: Secondary | ICD-10-CM | POA: Diagnosis not present

## 2017-04-26 DIAGNOSIS — I69320 Aphasia following cerebral infarction: Secondary | ICD-10-CM | POA: Diagnosis not present

## 2017-04-26 DIAGNOSIS — Z87891 Personal history of nicotine dependence: Secondary | ICD-10-CM

## 2017-04-26 DIAGNOSIS — D539 Nutritional anemia, unspecified: Secondary | ICD-10-CM | POA: Diagnosis not present

## 2017-04-26 DIAGNOSIS — E785 Hyperlipidemia, unspecified: Secondary | ICD-10-CM | POA: Diagnosis not present

## 2017-04-26 DIAGNOSIS — Z602 Problems related to living alone: Secondary | ICD-10-CM

## 2017-04-26 DIAGNOSIS — Z9181 History of falling: Secondary | ICD-10-CM

## 2017-04-26 DIAGNOSIS — I69398 Other sequelae of cerebral infarction: Secondary | ICD-10-CM | POA: Diagnosis not present

## 2017-04-26 DIAGNOSIS — Z7982 Long term (current) use of aspirin: Secondary | ICD-10-CM

## 2017-04-26 DIAGNOSIS — M6281 Muscle weakness (generalized): Secondary | ICD-10-CM | POA: Diagnosis not present

## 2017-04-26 DIAGNOSIS — I10 Essential (primary) hypertension: Secondary | ICD-10-CM | POA: Diagnosis not present

## 2017-04-28 ENCOUNTER — Other Ambulatory Visit: Payer: Self-pay | Admitting: *Deleted

## 2017-04-28 DIAGNOSIS — F0390 Unspecified dementia without behavioral disturbance: Secondary | ICD-10-CM | POA: Diagnosis not present

## 2017-04-28 DIAGNOSIS — I10 Essential (primary) hypertension: Secondary | ICD-10-CM | POA: Diagnosis not present

## 2017-04-28 NOTE — Patient Outreach (Signed)
[facility]N) Care Management  04/28/2017  Oliver PilaDonald Mcnelly 02/29/1934 [id number]    Referral received 3/11  RN attempted the initial outreach call for community however unsuccessful. RN able to leave a HIPAA approved voice message requesting a call back. Will engage further for [facility] services at that time. Will attempt another outreach call for tomorrow.  Elliot CousinLisa Matthews, RN Care Management Coordinator [facility] Main Office [phone]

## 2017-04-29 ENCOUNTER — Encounter: Payer: Self-pay | Admitting: *Deleted

## 2017-04-29 ENCOUNTER — Other Ambulatory Visit: Payer: Self-pay | Admitting: *Deleted

## 2017-04-29 NOTE — Patient Outreach (Signed)
[facility]N) Care Management  04/29/2017  William PilaDonald Mills 11/26/1934 [id number]    Telephone Assessment (2nd unsuccessful outreach attempt)  RN attempt to reach pt however was unsuccessful but able to leave a HIPAA approved voice message requesting a call back. Will intervene further if pt response. Will also send outreach letter and allow pt 10 day prior to contacting pt once again for pending services.   Elliot CousinLisa Leanthony Rhett, RN Care Management Coordinator [facility] Main Office [phone]

## 2017-05-01 ENCOUNTER — Ambulatory Visit: Payer: Medicare Other

## 2017-05-01 DIAGNOSIS — F0390 Unspecified dementia without behavioral disturbance: Secondary | ICD-10-CM | POA: Diagnosis not present

## 2017-05-01 DIAGNOSIS — I10 Essential (primary) hypertension: Secondary | ICD-10-CM | POA: Diagnosis not present
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
## 2017-05-05 DIAGNOSIS — F0390 Unspecified dementia without behavioral disturbance: Secondary | ICD-10-CM | POA: Diagnosis not present

## 2017-05-05 DIAGNOSIS — I10 Essential (primary) hypertension: Secondary | ICD-10-CM | POA: Diagnosis not present

## 2017-05-06 DIAGNOSIS — I10 Essential (primary) hypertension: Secondary | ICD-10-CM | POA: Diagnosis not present

## 2017-05-06 DIAGNOSIS — F0390 Unspecified dementia without behavioral disturbance: Secondary | ICD-10-CM | POA: Diagnosis not present

## 2017-05-08 ENCOUNTER — Other Ambulatory Visit: Payer: Self-pay | Admitting: Internal Medicine

## 2017-05-08 ENCOUNTER — Other Ambulatory Visit: Payer: Self-pay | Admitting: Neurology

## 2017-05-08 DIAGNOSIS — F0390 Unspecified dementia without behavioral disturbance: Secondary | ICD-10-CM | POA: Diagnosis not present

## 2017-05-08 DIAGNOSIS — G40209 Localization-related (focal) (partial) symptomatic epilepsy and epileptic syndromes with complex partial seizures, not intractable, without status epilepticus: Secondary | ICD-10-CM

## 2017-05-08 DIAGNOSIS — I10 Essential (primary) hypertension: Secondary | ICD-10-CM | POA: Diagnosis not present

## 2017-05-12 ENCOUNTER — Other Ambulatory Visit: Payer: Self-pay | Admitting: *Deleted

## 2017-05-12 ENCOUNTER — Encounter: Payer: Self-pay | Admitting: *Deleted

## 2017-05-12 DIAGNOSIS — F0390 Unspecified dementia without behavioral disturbance: Secondary | ICD-10-CM | POA: Diagnosis not present

## 2017-05-12 DIAGNOSIS — I10 Essential (primary) hypertension: Secondary | ICD-10-CM | POA: Diagnosis not present

## 2017-05-12 NOTE — Patient Outreach (Signed)
[facility]) Care Management  05/12/2017  William Mills 05/06/1934 [id number]    Third outreach unsuccessful as RN attempted call today. RN able to leave a HIPAA approved voice message. Due to unsuccessful contact case will be closed and notify primary provider of pt's disposition with [facility].   Elliot CousinLisa Tatem Fesler, RN Care Management Coordinator [facility] Main Office [phone]

## 2017-05-15 DIAGNOSIS — I10 Essential (primary) hypertension: Secondary | ICD-10-CM | POA: Diagnosis not present

## 2017-05-15 DIAGNOSIS — F0390 Unspecified dementia without behavioral disturbance: Secondary | ICD-10-CM | POA: Diagnosis not present

## 2017-05-16 ENCOUNTER — Other Ambulatory Visit: Payer: Self-pay | Admitting: Neurology

## 2017-05-16 DIAGNOSIS — G40209 Localization-related (focal) (partial) symptomatic epilepsy and epileptic syndromes with complex partial seizures, not intractable, without status epilepticus: Secondary | ICD-10-CM

## 2017-06-03 ENCOUNTER — Ambulatory Visit (INDEPENDENT_AMBULATORY_CARE_PROVIDER_SITE_OTHER): Payer: Medicare Other | Admitting: Neurology

## 2017-06-03 ENCOUNTER — Encounter: Payer: Self-pay | Admitting: Neurology

## 2017-06-03 ENCOUNTER — Other Ambulatory Visit: Payer: Self-pay

## 2017-06-03 VITALS — BP 144/68 | HR 57 | Ht 75.0 in | Wt 220.0 lb

## 2017-06-03 DIAGNOSIS — G40209 Localization-related (focal) (partial) symptomatic epilepsy and epileptic syndromes with complex partial seizures, not intractable, without status epilepticus: Secondary | ICD-10-CM | POA: Diagnosis not present

## 2017-06-03 DIAGNOSIS — G3184 Mild cognitive impairment, so stated: Secondary | ICD-10-CM

## 2017-06-03 MED ORDER — DONEPEZIL HCL 5 MG PO TABS: ORAL_TABLET | ORAL | 11 refills | Status: DC

## 2017-06-03 MED ORDER — LEVETIRACETAM ER 500 MG PO TB24: 1000.0000 mg | ORAL_TABLET | Freq: Every day | ORAL | 3 refills | Status: DC
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
## 2017-06-04 ENCOUNTER — Encounter: Payer: Self-pay | Admitting: Neurology

## 2017-07-09 ENCOUNTER — Other Ambulatory Visit: Payer: Self-pay | Admitting: Internal Medicine

## 2017-07-09 DIAGNOSIS — I10 Essential (primary) hypertension: Secondary | ICD-10-CM

## 2017-09-26 ENCOUNTER — Other Ambulatory Visit: Payer: Self-pay | Admitting: Internal Medicine

## 2017-09-26 DIAGNOSIS — I10 Essential (primary) hypertension: Secondary | ICD-10-CM

## 2017-10-13 ENCOUNTER — Encounter: Payer: Self-pay | Admitting: Internal Medicine

## 2017-10-13 ENCOUNTER — Ambulatory Visit (INDEPENDENT_AMBULATORY_CARE_PROVIDER_SITE_OTHER): Payer: Medicare Other | Admitting: Internal Medicine

## 2017-10-13 ENCOUNTER — Other Ambulatory Visit (INDEPENDENT_AMBULATORY_CARE_PROVIDER_SITE_OTHER): Payer: Medicare Other

## 2017-10-13 VITALS — BP 140/80 | HR 58 | Temp 98.6°F | Resp 16 | Ht 75.0 in | Wt 220.8 lb

## 2017-10-13 DIAGNOSIS — I10 Essential (primary) hypertension: Secondary | ICD-10-CM | POA: Diagnosis not present

## 2017-10-13 DIAGNOSIS — R2681 Unsteadiness on feet: Secondary | ICD-10-CM

## 2017-10-13 LAB — BASIC METABOLIC PANEL
BUN: 21 mg/dL (ref 6–23)
CO2: 29 mEq/L (ref 19–32)
Calcium: 9.7 mg/dL (ref 8.4–10.5)
Chloride: 106 mEq/L (ref 96–112)
Creatinine, Ser: 0.97 mg/dL (ref 0.40–1.50)
GFR: 78.92 mL/min (ref >60.00)
GLUCOSE: 98 mg/dL (ref 70–99)
Potassium: 3.7 mEq/L (ref 3.5–5.1)
SODIUM: 142 meq/L (ref 135–145)

## 2017-10-13 NOTE — Patient Instructions (Signed)

## 2017-10-13 NOTE — Progress Notes (Signed)
Subjective:  Patient ID: William Mills, male    DOB: 19-Jun-1934  Age: 83 y.o. MRN: [id number]  CC: Hypertension   HPI William Mills presents for f/up - He tells me his blood pressure has been well controlled.  He denies any recent episodes of headache, CP, DOE, palpitations, edema, or fatigue.  He has chronic, unchanged, residual symptoms after prior CVA.  Several years ago he did physical therapy and it helped him with his balance and his ability to get up and out of a chair and out of the bed.  He has no new neurological complaints but wants to try PT again.  Outpatient Medications Prior to Visit  Medication Sig Dispense Refill  . atorvastatin (LIPITOR) 20 MG tablet TAKE 1 TABLET BY MOUTH ONCE DAILY 90 tablet 1  . bimatoprost (LUMIGAN) 0.01 % SOLN INSTILL ONE DROP INTO LEFT EYE EVERY DAY AT BEDTIME 3 mL 5  . brimonidine-timolol (COMBIGAN) 0.2-0.5 % ophthalmic solution Place 1 drop into the right eye 2 (two) times daily. 5 mL 5  . carvedilol (COREG) 3.125 MG tablet Take 1 tablet (3.125 mg total) by mouth 2 (two) times daily with a meal. 180 tablet 0  . donepezil (ARICEPT) 5 MG tablet Take 1/2 tablet daily for 2 weeks, then increase to 1 tablet 30 tablet 11  . dorzolamide (TRUSOPT) 2 % ophthalmic solution Place 1 drop into both eyes 2 (two) times daily.    . irbesartan-hydrochlorothiazide (AVALIDE) 150-12.5 MG tablet Take 1 tablet by mouth daily. 90 tablet 1  . irbesartan-hydrochlorothiazide (AVALIDE) 150-12.5 MG tablet TAKE 1 TABLET BY MOUTH ONCE DAILY 90 tablet 1  . levETIRAcetam (KEPPRA XR) 500 MG 24 hr tablet Take 2 tablets (1,000 mg total) by mouth at bedtime. 180 tablet 3   No facility-administered medications prior to visit.     ROS Review of Systems  Constitutional: Negative for diaphoresis and fatigue.  HENT: Negative.   Eyes: Negative for visual disturbance.  Respiratory: Negative for cough, chest tightness, shortness of breath and wheezing.   Cardiovascular: Negative  for chest pain, palpitations and leg swelling.  Gastrointestinal: Negative for abdominal pain, constipation, diarrhea, nausea and vomiting.  Endocrine: Negative.   Genitourinary: Negative.  Negative for difficulty urinating.  Musculoskeletal: Positive for gait problem. Negative for arthralgias, back pain and myalgias.  Skin: Negative.  Negative for color change.  Neurological: Positive for weakness. Negative for dizziness, seizures, syncope, light-headedness and numbness.  Hematological: Negative for adenopathy. Does not bruise/bleed easily.  Psychiatric/Behavioral: Negative.     Objective:  BP 140/80 (BP Location: Left Arm, Patient Position: Sitting, Cuff Size: Normal)   Pulse (!) 58   Temp 98.6 F (37 C) (Oral)   Resp 16   Ht 6\' 3"  (1.905 m)   Wt 220 lb 12 oz (100.1 kg)   SpO2 99%   BMI 27.59 kg/m   BP Readings from Last 3 Encounters:  10/13/17 140/80  06/03/17 (!) 144/68  04/15/17 140/70    Wt Readings from Last 3 Encounters:  10/13/17 220 lb 12 oz (100.1 kg)  06/03/17 220 lb (99.8 kg)  04/15/17 222 lb (100.7 kg)    Physical Exam  Constitutional: No distress.  HENT:  Mouth/Throat: Oropharynx is clear and moist. No oropharyngeal exudate.  Eyes: Conjunctivae are normal. No scleral icterus.  Neck: Normal range of motion. Neck supple. No JVD present. No thyromegaly present.  Cardiovascular: Normal rate, regular rhythm and normal heart sounds.  Pulmonary/Chest: Effort normal and breath sounds normal. He has no wheezes.  He has no rales.  Abdominal: Soft. Bowel sounds are normal. He exhibits no mass. There is no hepatosplenomegaly. There is no tenderness.  Musculoskeletal: Normal range of motion. He exhibits no edema or deformity.  Lymphadenopathy:    He has no cervical adenopathy.  Neurological: He displays atrophy. He exhibits abnormal muscle tone. He displays a negative Romberg sign. He displays no seizure activity. Coordination and gait abnormal.  Skin: He is not  diaphoretic.  Psychiatric: His mood appears not anxious. His speech is delayed and tangential. His speech is not rapid and/or pressured. He is slowed and withdrawn. Thought content is not paranoid. Cognition and memory are normal. He does not exhibit a depressed mood. He expresses no homicidal and no suicidal ideation.  Vitals reviewed.   Lab Results  Component Value Date   WBC 6.4 04/15/2017   HGB 14.6 04/15/2017   HCT 43.9 04/15/2017   PLT 251.0 04/15/2017   GLUCOSE 98 10/13/2017   CHOL 162 04/15/2017   TRIG 122.0 04/15/2017   HDL 58.10 04/15/2017   LDLCALC 79 04/15/2017   ALT 13 04/15/2017   AST 13 04/15/2017   NA 142 10/13/2017   K 3.7 10/13/2017   CL 106 10/13/2017   CREATININE 0.97 10/13/2017   BUN 21 10/13/2017   CO2 29 10/13/2017   TSH 2.75 04/15/2017   TSH 2.27 04/15/2017   PSA 1.36 11/21/2009   HGBA1C 5.7 08/11/2014    No results found.  Assessment & Plan:   William Mills was seen today for hypertension.  Diagnoses and all orders for this visit:  Essential hypertension- His blood pressure is well controlled.  Electrolytes and renal function are normal. -     Basic Metabolic Panel (BMET); Future  Gait instability- Will try PT again. -     Ambulatory referral to Physical Therapy   I am having William Mills maintain his brimonidine-timolol, bimatoprost, dorzolamide, irbesartan-hydrochlorothiazide, irbesartan-hydrochlorothiazide, atorvastatin, donepezil, levETIRAcetam, and carvedilol.  No orders of the defined types were placed in this encounter.    Follow-up: Return in about 6 months (around 04/15/2018).  Sanda Lingerhomas Tarrin Menn, MD

## 2017-11-05 DIAGNOSIS — Z23 Encounter for immunization: Secondary | ICD-10-CM | POA: Diagnosis not present

## 2017-12-02 ENCOUNTER — Other Ambulatory Visit: Payer: Self-pay | Admitting: Internal Medicine

## 2017-12-03 ENCOUNTER — Other Ambulatory Visit: Payer: Self-pay

## 2017-12-03 ENCOUNTER — Encounter: Payer: Self-pay | Admitting: Neurology

## 2017-12-03 ENCOUNTER — Ambulatory Visit (INDEPENDENT_AMBULATORY_CARE_PROVIDER_SITE_OTHER): Payer: Medicare Other | Admitting: Neurology

## 2017-12-03 VITALS — BP 168/80 | HR 52 | Ht 74.5 in | Wt 221.0 lb

## 2017-12-03 DIAGNOSIS — G40209 Localization-related (focal) (partial) symptomatic epilepsy and epileptic syndromes with complex partial seizures, not intractable, without status epilepticus: Secondary | ICD-10-CM

## 2017-12-03 DIAGNOSIS — G3184 Mild cognitive impairment, so stated: Secondary | ICD-10-CM | POA: Diagnosis not present

## 2017-12-03 MED ORDER — DONEPEZIL HCL 5 MG PO TABS: ORAL_TABLET | ORAL | 3 refills | Status: DC

## 2017-12-03 MED ORDER — LEVETIRACETAM ER 500 MG PO TB24: 1000.0000 mg | ORAL_TABLET | Freq: Every day | ORAL | 3 refills | Status: DC
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
## 2017-12-22 DIAGNOSIS — H25811 Combined forms of age-related cataract, right eye: Secondary | ICD-10-CM | POA: Diagnosis not present

## 2017-12-22 DIAGNOSIS — H401493 Capsular glaucoma with pseudoexfoliation of lens, unspecified eye, severe stage: Secondary | ICD-10-CM | POA: Diagnosis not present

## 2017-12-22 DIAGNOSIS — H5440 Blindness, one eye, unspecified eye: Secondary | ICD-10-CM | POA: Diagnosis not present

## 2017-12-29 ENCOUNTER — Other Ambulatory Visit: Payer: Self-pay | Admitting: Internal Medicine

## 2017-12-29 DIAGNOSIS — I10 Essential (primary) hypertension: Secondary | ICD-10-CM

## 2018-01-06 ENCOUNTER — Telehealth: Payer: Self-pay

## 2018-01-06 DIAGNOSIS — G40209 Localization-related (focal) (partial) symptomatic epilepsy and epileptic syndromes with complex partial seizures, not intractable, without status epilepticus: Secondary | ICD-10-CM

## 2018-01-06 MED ORDER — LEVETIRACETAM 1000 MG PO TABS: 1000.0000 mg | ORAL_TABLET | Freq: Two times a day (BID) | ORAL | 3 refills | Status: DC

## 2018-01-06 NOTE — Telephone Encounter (Signed)
Received call from [facility] pharmacy that Reynold BowenKeppraXR is on back order.  Pharmacist states that they are able to get 1,000mg  non-XR tablets.  I stated OK to give 1000mg  non-XR, but sig will need to be changed from QHS to BID.  Pharmacist asks that I send new Rx with this information for their records.

## 2018-01-06 NOTE — Telephone Encounter (Signed)
Keppra 1000mg  #180 with 3 refills Sig = Take 1 tablet BID  Sent to [facility] on [address]

## 2018-02-20 ENCOUNTER — Other Ambulatory Visit: Payer: Self-pay | Admitting: Internal Medicine

## 2018-02-20 DIAGNOSIS — I10 Essential (primary) hypertension: Secondary | ICD-10-CM

## 2018-03-23 ENCOUNTER — Telehealth: Payer: Self-pay | Admitting: Internal Medicine

## 2018-03-23 NOTE — Telephone Encounter (Signed)
Called patient to schedule AWV; No answer could not leave voicemail. SF

## 2018-04-07 ENCOUNTER — Other Ambulatory Visit: Payer: Self-pay | Admitting: Internal Medicine

## 2018-04-07 DIAGNOSIS — I10 Essential (primary) hypertension: Secondary | ICD-10-CM

## 2018-05-27 ENCOUNTER — Other Ambulatory Visit: Payer: Self-pay | Admitting: Internal Medicine

## 2018-05-27 DIAGNOSIS — I10 Essential (primary) hypertension: Secondary | ICD-10-CM

## 2018-06-19 ENCOUNTER — Other Ambulatory Visit: Payer: Self-pay | Admitting: Internal Medicine

## 2018-07-06 ENCOUNTER — Other Ambulatory Visit: Payer: Self-pay | Admitting: Internal Medicine

## 2018-07-06 DIAGNOSIS — I10 Essential (primary) hypertension: Secondary | ICD-10-CM

## 2018-07-21 ENCOUNTER — Other Ambulatory Visit: Payer: Self-pay

## 2018-07-21 ENCOUNTER — Ambulatory Visit (INDEPENDENT_AMBULATORY_CARE_PROVIDER_SITE_OTHER): Payer: Medicare Other | Admitting: Internal Medicine

## 2018-07-21 ENCOUNTER — Encounter: Payer: Self-pay | Admitting: Internal Medicine

## 2018-07-21 ENCOUNTER — Other Ambulatory Visit (INDEPENDENT_AMBULATORY_CARE_PROVIDER_SITE_OTHER): Payer: Medicare Other

## 2018-07-21 VITALS — BP 138/78 | HR 55 | Temp 98.4°F | Resp 16 | Ht 74.5 in | Wt 216.0 lb

## 2018-07-21 DIAGNOSIS — E785 Hyperlipidemia, unspecified: Secondary | ICD-10-CM

## 2018-07-21 DIAGNOSIS — I10 Essential (primary) hypertension: Secondary | ICD-10-CM

## 2018-07-21 DIAGNOSIS — G40209 Localization-related (focal) (partial) symptomatic epilepsy and epileptic syndromes with complex partial seizures, not intractable, without status epilepticus: Secondary | ICD-10-CM

## 2018-07-21 DIAGNOSIS — F0390 Unspecified dementia without behavioral disturbance: Secondary | ICD-10-CM | POA: Diagnosis not present

## 2018-07-21 LAB — COMPREHENSIVE METABOLIC PANEL
ALT: 14 U/L (ref 0–53)
AST: 15 U/L (ref 0–37)
Albumin: 4.2 g/dL (ref 3.5–5.2)
Alkaline Phosphatase: 79 U/L (ref 39–117)
BUN: 18 mg/dL (ref 6–23)
CO2: 31 mEq/L (ref 19–32)
Calcium: 9.8 mg/dL (ref 8.4–10.5)
Chloride: 104 mEq/L (ref 96–112)
Creatinine, Ser: 0.87 mg/dL (ref 0.40–1.50)
GFR: 84.03 mL/min (ref >60.00)
Glucose, Bld: 90 mg/dL (ref 70–99)
Potassium: 3.9 mEq/L (ref 3.5–5.1)
Sodium: 142 mEq/L (ref 135–145)
Total Bilirubin: 0.9 mg/dL (ref 0.2–1.2)
Total Protein: 7.6 g/dL (ref 6.0–8.3)

## 2018-07-21 LAB — LIPID PANEL
Cholesterol: 158 mg/dL (ref 0–200)
HDL: 62.6 mg/dL (ref >39.00)
LDL Cholesterol: 71 mg/dL (ref 0–99)
NonHDL: 95.26
Total CHOL/HDL Ratio: 3
Triglycerides: 119 mg/dL (ref 0.0–149.0)
VLDL: 23.8 mg/dL (ref 0.0–40.0)

## 2018-07-21 LAB — CBC WITH DIFFERENTIAL/PLATELET
Basophils Absolute: 0 10*3/uL (ref 0.0–0.1)
Basophils Relative: 0.5 % (ref 0.0–3.0)
Eosinophils Absolute: 0.2 10*3/uL (ref 0.0–0.7)
Eosinophils Relative: 3.6 % (ref 0.0–5.0)
HCT: 42 % (ref 39.0–52.0)
Hemoglobin: 14.3 g/dL (ref 13.0–17.0)
Lymphocytes Relative: 34 % (ref 12.0–46.0)
Lymphs Abs: 1.7 10*3/uL (ref 0.7–4.0)
MCHC: 34 g/dL (ref 30.0–36.0)
MCV: 87.6 fl (ref 78.0–100.0)
Monocytes Absolute: 0.2 10*3/uL (ref 0.1–1.0)
Monocytes Relative: 3.9 % (ref 3.0–12.0)
Neutro Abs: 2.9 10*3/uL (ref 1.4–7.7)
Neutrophils Relative %: 58 % (ref 43.0–77.0)
Platelets: 218 10*3/uL (ref 150.0–400.0)
RBC: 4.79 Mil/uL (ref 4.22–5.81)
RDW: 14.6 % (ref 11.5–15.5)
WBC: 5.1 10*3/uL (ref 4.0–10.5)

## 2018-07-21 LAB — TSH: TSH: 2.78 u[IU]/mL (ref 0.35–4.50)

## 2018-07-21 MED ORDER — IRBESARTAN-HYDROCHLOROTHIAZIDE 150-12.5 MG PO TABS: 1.0000 | ORAL_TABLET | Freq: Every day | ORAL | 0 refills | Status: DC

## 2018-07-21 MED ORDER — CARVEDILOL 3.125 MG PO TABS: ORAL_TABLET | ORAL | 0 refills | Status: DC

## 2018-07-21 NOTE — Progress Notes (Signed)
Subjective:  Patient ID: William Mills, male    DOB: 10/22/1934  Age: 84 y.o. MRN: [id number]  CC: Hypertension   HPI William PilaDonald Andel presents for f/up - He tells me that he has felt well recently and offers no complaints today.  He denies any recent seizure activity.  His appetite is good and his weight is stable.  Outpatient Medications Prior to Visit  Medication Sig Dispense Refill  . atorvastatin (LIPITOR) 20 MG tablet Take 1 tablet by mouth once daily 90 tablet 0  . bimatoprost (LUMIGAN) 0.01 % SOLN INSTILL ONE DROP INTO LEFT EYE EVERY DAY AT BEDTIME 3 mL 5  . brimonidine-timolol (COMBIGAN) 0.2-0.5 % ophthalmic solution Place 1 drop into the right eye 2 (two) times daily. 5 mL 5  . donepezil (ARICEPT) 5 MG tablet Take 1 tablet daily 90 tablet 3  . dorzolamide (TRUSOPT) 2 % ophthalmic solution Place 1 drop into both eyes 2 (two) times daily.    Marland Kitchen. levETIRAcetam (KEPPRA XR) 500 MG 24 hr tablet Take 2 tablets (1,000 mg total) by mouth at bedtime. 180 tablet 3  . levETIRAcetam (KEPPRA) 1000 MG tablet Take 1 tablet (1,000 mg total) by mouth 2 (two) times daily. 180 tablet 3  . carvedilol (COREG) 3.125 MG tablet TAKE 1 TABLET BY MOUTH TWICE DAILY WITH A MEAL 180 tablet 0  . irbesartan-hydrochlorothiazide (AVALIDE) 150-12.5 MG tablet TAKE 1 TABLET BY MOUTH ONCE DAILY 90 tablet 1  . irbesartan-hydrochlorothiazide (AVALIDE) 150-12.5 MG tablet Take 1 tablet by mouth once daily 90 tablet 0   No facility-administered medications prior to visit.     ROS Review of Systems  Constitutional: Negative for appetite change, diaphoresis, fatigue and unexpected weight change.  HENT: Negative.   Eyes: Negative for visual disturbance.  Respiratory: Negative for cough, chest tightness, shortness of breath and wheezing.   Cardiovascular: Negative for chest pain, palpitations and leg swelling.  Gastrointestinal: Negative for abdominal pain, constipation, diarrhea, nausea and vomiting.  Endocrine:  Negative.   Genitourinary: Negative.  Negative for difficulty urinating.  Musculoskeletal: Negative.  Negative for arthralgias.  Skin: Negative.  Negative for color change.  Neurological: Negative.  Negative for dizziness, seizures, weakness and numbness.  Hematological: Negative for adenopathy. Does not bruise/bleed easily.  Psychiatric/Behavioral: Negative.     Objective:  BP 138/78 (BP Location: Left Arm, Patient Position: Sitting, Cuff Size: Large)   Pulse (!) 55   Temp 98.4 F (36.9 C) (Oral)   Resp 16   Ht 6' 2.5" (1.892 m)   Wt 216 lb (98 kg)   SpO2 99%   BMI 27.36 kg/m   BP Readings from Last 3 Encounters:  07/21/18 138/78  12/03/17 (!) 168/80  10/13/17 140/80    Wt Readings from Last 3 Encounters:  07/21/18 216 lb (98 kg)  12/03/17 221 lb (100.2 kg)  10/13/17 220 lb 12 oz (100.1 kg)    Physical Exam Vitals signs reviewed.  Constitutional:      Appearance: He is not ill-appearing or diaphoretic.  HENT:     Nose: Nose normal.     Mouth/Throat:     Mouth: Mucous membranes are moist.     Pharynx: No posterior oropharyngeal erythema.  Eyes:     General: No scleral icterus.    Conjunctiva/sclera: Conjunctivae normal.  Neck:     Musculoskeletal: Normal range of motion and neck supple. No neck rigidity.  Cardiovascular:     Rate and Rhythm: Regular rhythm. Bradycardia present.  No extrasystoles are present.  Heart sounds: S1 normal and S2 normal. No murmur. No S3 or S4 sounds.   Pulmonary:     Effort: No respiratory distress.     Breath sounds: No stridor. No wheezing, rhonchi or rales.  Abdominal:     General: Abdomen is flat.     Palpations: There is no hepatomegaly, splenomegaly or mass.     Tenderness: There is no abdominal tenderness. There is no guarding.  Musculoskeletal:     Right lower leg: No edema.     Left lower leg: No edema.  Lymphadenopathy:     Cervical: No cervical adenopathy.  Skin:    General: Skin is warm.     Coloration: Skin is  not pale.  Neurological:     General: No focal deficit present.     Mental Status: Mental status is at baseline.  Psychiatric:        Mood and Affect: Mood normal.        Behavior: Behavior normal.     Lab Results  Component Value Date   WBC 6.4 04/15/2017   HGB 14.6 04/15/2017   HCT 43.9 04/15/2017   PLT 251.0 04/15/2017   GLUCOSE 98 10/13/2017   CHOL 162 04/15/2017   TRIG 122.0 04/15/2017   HDL 58.10 04/15/2017   LDLCALC 79 04/15/2017   ALT 13 04/15/2017   AST 13 04/15/2017   NA 142 10/13/2017   K 3.7 10/13/2017   CL 106 10/13/2017   CREATININE 0.97 10/13/2017   BUN 21 10/13/2017   CO2 29 10/13/2017   TSH 2.75 04/15/2017   TSH 2.27 04/15/2017   PSA 1.36 11/21/2009   HGBA1C 5.7 08/11/2014    No results found.  Assessment & Plan:   Sylvesta was seen today for hypertension.  Diagnoses and all orders for this visit:  Essential hypertension- His blood pressure is adequately well controlled.  Electrolytes and renal function are normal.  Will continue the combination of carvedilol, irbesartan, and hydrochlorothiazide. -     Comprehensive metabolic panel; Future -     CBC with Differential/Platelet; Future -     TSH; Future -     carvedilol (COREG) 3.125 MG tablet; TAKE 1 TABLET BY MOUTH TWICE DAILY WITH A MEAL -     irbesartan-hydrochlorothiazide (AVALIDE) 150-12.5 MG tablet; Take 1 tablet by mouth daily.  Dementia arising in the senium and presenium [facility])- He is at his baseline.  There are no treatment options for this.  Localization-related symptomatic epilepsy and epileptic syndromes with complex partial seizures, not intractable, without status epilepticus (HCC)- He has been seizure-free.  I will monitor his Keppra level. -     Levetiracetam level; Future  Hyperlipidemia with target LDL less than 130- Statin therapy is not indicated due to his advanced age. -     Lipid panel; Future -     TSH; Future   I have changed Keison Frese's  irbesartan-hydrochlorothiazide. I am also having him maintain his brimonidine-timolol, bimatoprost, dorzolamide, donepezil, levETIRAcetam, levETIRAcetam, atorvastatin, and carvedilol.  Meds ordered this encounter  Medications  . carvedilol (COREG) 3.125 MG tablet    Sig: TAKE 1 TABLET BY MOUTH TWICE DAILY WITH A MEAL    Dispense:  180 tablet    Refill:  0  . irbesartan-hydrochlorothiazide (AVALIDE) 150-12.5 MG tablet    Sig: Take 1 tablet by mouth daily.    Dispense:  90 tablet    Refill:  0     Follow-up: No follow-ups on file.  Sanda Linger, MD

## 2018-07-21 NOTE — Patient Instructions (Signed)

## 2018-07-22 ENCOUNTER — Encounter: Payer: Self-pay | Admitting: Neurology

## 2018-07-24 ENCOUNTER — Encounter: Payer: Self-pay | Admitting: Internal Medicine

## 2018-07-24 LAB — LEVETIRACETAM LEVEL: Keppra (Levetiracetam): 25.9 ug/mL (ref 12.0–46.0)

## 2018-07-28 ENCOUNTER — Ambulatory Visit: Payer: Medicare Other | Admitting: Neurology

## 2018-08-26 ENCOUNTER — Telehealth: Payer: Self-pay | Admitting: *Deleted

## 2018-08-26 NOTE — Telephone Encounter (Signed)
LVM for patient to call back in regards to scheduling AWV with our health coach. Discussed doing a virtual and/or audio visit and asked patient to call nurse back at [phone] to schedule.

## 2018-09-22 ENCOUNTER — Other Ambulatory Visit: Payer: Self-pay | Admitting: Internal Medicine

## 2018-10-27 ENCOUNTER — Other Ambulatory Visit: Payer: Self-pay | Admitting: Internal Medicine

## 2018-10-27 DIAGNOSIS — I10 Essential (primary) hypertension: Secondary | ICD-10-CM

## 2018-11-06 DIAGNOSIS — H401493 Capsular glaucoma with pseudoexfoliation of lens, unspecified eye, severe stage: Secondary | ICD-10-CM | POA: Diagnosis not present

## 2018-11-06 DIAGNOSIS — H5440 Blindness, one eye, unspecified eye: Secondary | ICD-10-CM | POA: Diagnosis not present

## 2018-12-02 ENCOUNTER — Other Ambulatory Visit: Payer: Self-pay | Admitting: Internal Medicine

## 2018-12-02 DIAGNOSIS — I10 Essential (primary) hypertension: Secondary | ICD-10-CM

## 2018-12-08 DIAGNOSIS — Z23 Encounter for immunization: Secondary | ICD-10-CM | POA: Diagnosis not present

## 2019-01-01 ENCOUNTER — Other Ambulatory Visit: Payer: Self-pay | Admitting: Neurology

## 2019-01-12 ENCOUNTER — Other Ambulatory Visit: Payer: Self-pay | Admitting: Neurology

## 2019-01-25 ENCOUNTER — Telehealth: Payer: Self-pay

## 2019-01-25 ENCOUNTER — Other Ambulatory Visit: Payer: Self-pay | Admitting: Internal Medicine

## 2019-01-25 DIAGNOSIS — G40209 Localization-related (focal) (partial) symptomatic epilepsy and epileptic syndromes with complex partial seizures, not intractable, without status epilepticus: Secondary | ICD-10-CM

## 2019-01-25 DIAGNOSIS — I10 Essential (primary) hypertension: Secondary | ICD-10-CM

## 2019-01-25 DIAGNOSIS — F0390 Unspecified dementia without behavioral disturbance: Secondary | ICD-10-CM

## 2019-01-25 DIAGNOSIS — E785 Hyperlipidemia, unspecified: Secondary | ICD-10-CM

## 2019-01-25 MED ORDER — LEVETIRACETAM 1000 MG PO TABS: 1000.0000 mg | ORAL_TABLET | Freq: Two times a day (BID) | ORAL | 0 refills | Status: DC

## 2019-01-25 MED ORDER — ATORVASTATIN CALCIUM 20 MG PO TABS: 20.0000 mg | ORAL_TABLET | Freq: Every day | ORAL | 0 refills | Status: DC

## 2019-01-25 MED ORDER — DONEPEZIL HCL 5 MG PO TABS: ORAL_TABLET | ORAL | 0 refills | Status: DC

## 2019-01-25 MED ORDER — IRBESARTAN-HYDROCHLOROTHIAZIDE 150-12.5 MG PO TABS: 1.0000 | ORAL_TABLET | Freq: Every day | ORAL | 0 refills | Status: DC

## 2019-01-25 MED ORDER — CARVEDILOL 3.125 MG PO TABS: 3.1250 mg | ORAL_TABLET | Freq: Two times a day (BID) | ORAL | 0 refills | Status: DC

## 2019-01-25 NOTE — Telephone Encounter (Signed)
Can you send in a 90 day with refill of the following for this patient? His daughters have moved him closer to them (close to [facility], [place]). Pt has an appt with SW today to   Keppra 1000 mg bid irbesartan hctz 150-12.5mg  qd donepezil 5mg  qd carvedilol 3.125 mg bid atorvastatin 20 mg qd   Call to dtr at [phone]

## 2019-01-25 NOTE — Telephone Encounter (Signed)
Tried to call x 2 - line is busy.

## 2019-01-25 NOTE — Telephone Encounter (Signed)
RX's sent  TJ 

## 2019-03-05 DIAGNOSIS — R569 Unspecified convulsions: Secondary | ICD-10-CM | POA: Diagnosis not present

## 2019-03-05 DIAGNOSIS — Z23 Encounter for immunization: Secondary | ICD-10-CM | POA: Diagnosis not present

## 2019-03-05 DIAGNOSIS — R413 Other amnesia: Secondary | ICD-10-CM | POA: Diagnosis not present

## 2019-03-05 DIAGNOSIS — R269 Unspecified abnormalities of gait and mobility: Secondary | ICD-10-CM | POA: Diagnosis not present

## 2019-03-05 DIAGNOSIS — E785 Hyperlipidemia, unspecified: Secondary | ICD-10-CM | POA: Diagnosis not present

## 2019-03-05 DIAGNOSIS — I10 Essential (primary) hypertension: Secondary | ICD-10-CM | POA: Diagnosis not present

## 2019-03-24 DIAGNOSIS — Z23 Encounter for immunization: Secondary | ICD-10-CM | POA: Diagnosis not present

## 2019-03-26 DIAGNOSIS — Z982 Presence of cerebrospinal fluid drainage device: Secondary | ICD-10-CM | POA: Diagnosis not present

## 2019-03-26 DIAGNOSIS — R410 Disorientation, unspecified: Secondary | ICD-10-CM | POA: Diagnosis not present

## 2019-03-26 DIAGNOSIS — R531 Weakness: Secondary | ICD-10-CM | POA: Diagnosis not present

## 2019-03-26 DIAGNOSIS — R001 Bradycardia, unspecified: Secondary | ICD-10-CM | POA: Diagnosis not present

## 2019-03-26 DIAGNOSIS — Z79899 Other long term (current) drug therapy: Secondary | ICD-10-CM | POA: Diagnosis not present

## 2019-03-26 DIAGNOSIS — Z743 Need for continuous supervision: Secondary | ICD-10-CM | POA: Diagnosis not present

## 2019-03-26 DIAGNOSIS — I10 Essential (primary) hypertension: Secondary | ICD-10-CM | POA: Diagnosis not present

## 2019-03-26 DIAGNOSIS — E785 Hyperlipidemia, unspecified: Secondary | ICD-10-CM | POA: Diagnosis not present

## 2019-03-26 DIAGNOSIS — F0390 Unspecified dementia without behavioral disturbance: Secondary | ICD-10-CM | POA: Diagnosis not present

## 2019-03-26 DIAGNOSIS — R4182 Altered mental status, unspecified: Secondary | ICD-10-CM | POA: Diagnosis not present

## 2019-04-13 DIAGNOSIS — I10 Essential (primary) hypertension: Secondary | ICD-10-CM | POA: Diagnosis not present

## 2019-04-13 DIAGNOSIS — E785 Hyperlipidemia, unspecified: Secondary | ICD-10-CM | POA: Diagnosis not present

## 2019-04-13 DIAGNOSIS — Z8673 Personal history of transient ischemic attack (TIA), and cerebral infarction without residual deficits: Secondary | ICD-10-CM | POA: Diagnosis not present

## 2019-04-13 DIAGNOSIS — H5440 Blindness, one eye, unspecified eye: Secondary | ICD-10-CM | POA: Diagnosis not present

## 2019-04-13 DIAGNOSIS — L989 Disorder of the skin and subcutaneous tissue, unspecified: Secondary | ICD-10-CM | POA: Diagnosis not present

## 2019-04-13 DIAGNOSIS — R569 Unspecified convulsions: Secondary | ICD-10-CM | POA: Diagnosis not present

## 2019-04-13 DIAGNOSIS — R269 Unspecified abnormalities of gait and mobility: Secondary | ICD-10-CM | POA: Diagnosis not present

## 2019-04-13 DIAGNOSIS — F0390 Unspecified dementia without behavioral disturbance: Secondary | ICD-10-CM | POA: Diagnosis not present

## 2019-04-14 DIAGNOSIS — Z23 Encounter for immunization: Secondary | ICD-10-CM | POA: Diagnosis not present

## 2019-04-16 DIAGNOSIS — L989 Disorder of the skin and subcutaneous tissue, unspecified: Secondary | ICD-10-CM | POA: Diagnosis not present

## 2019-04-16 DIAGNOSIS — I69398 Other sequelae of cerebral infarction: Secondary | ICD-10-CM | POA: Diagnosis not present

## 2019-04-16 DIAGNOSIS — E785 Hyperlipidemia, unspecified: Secondary | ICD-10-CM | POA: Diagnosis not present

## 2019-04-16 DIAGNOSIS — I10 Essential (primary) hypertension: Secondary | ICD-10-CM | POA: Diagnosis not present

## 2019-04-16 DIAGNOSIS — F0390 Unspecified dementia without behavioral disturbance: Secondary | ICD-10-CM | POA: Diagnosis not present

## 2019-04-16 DIAGNOSIS — Z9181 History of falling: Secondary | ICD-10-CM | POA: Diagnosis not present

## 2019-04-16 DIAGNOSIS — H9190 Unspecified hearing loss, unspecified ear: Secondary | ICD-10-CM | POA: Diagnosis not present

## 2019-04-16 DIAGNOSIS — N39498 Other specified urinary incontinence: Secondary | ICD-10-CM | POA: Diagnosis not present

## 2019-04-16 DIAGNOSIS — H5442A4 Blindness left eye category 4, normal vision right eye: Secondary | ICD-10-CM | POA: Diagnosis not present

## 2019-04-16 DIAGNOSIS — Z87891 Personal history of nicotine dependence: Secondary | ICD-10-CM | POA: Diagnosis not present

## 2019-04-16 DIAGNOSIS — R269 Unspecified abnormalities of gait and mobility: Secondary | ICD-10-CM | POA: Diagnosis not present

## 2019-04-16 DIAGNOSIS — G40909 Epilepsy, unspecified, not intractable, without status epilepticus: Secondary | ICD-10-CM | POA: Diagnosis not present

## 2019-04-23 DIAGNOSIS — G40909 Epilepsy, unspecified, not intractable, without status epilepticus: Secondary | ICD-10-CM | POA: Diagnosis not present

## 2019-04-23 DIAGNOSIS — I10 Essential (primary) hypertension: Secondary | ICD-10-CM | POA: Diagnosis not present

## 2019-04-23 DIAGNOSIS — E785 Hyperlipidemia, unspecified: Secondary | ICD-10-CM | POA: Diagnosis not present

## 2019-04-23 DIAGNOSIS — F0390 Unspecified dementia without behavioral disturbance: Secondary | ICD-10-CM | POA: Diagnosis not present

## 2019-04-23 DIAGNOSIS — H5442A4 Blindness left eye category 4, normal vision right eye: Secondary | ICD-10-CM | POA: Diagnosis not present

## 2019-04-23 DIAGNOSIS — I69398 Other sequelae of cerebral infarction: Secondary | ICD-10-CM | POA: Diagnosis not present

## 2019-04-24 ENCOUNTER — Other Ambulatory Visit: Payer: Self-pay | Admitting: Internal Medicine

## 2019-04-24 DIAGNOSIS — F0390 Unspecified dementia without behavioral disturbance: Secondary | ICD-10-CM

## 2019-04-24 DIAGNOSIS — E785 Hyperlipidemia, unspecified: Secondary | ICD-10-CM

## 2019-04-24 DIAGNOSIS — I10 Essential (primary) hypertension: Secondary | ICD-10-CM

## 2019-04-29 ENCOUNTER — Other Ambulatory Visit: Payer: Self-pay | Admitting: Internal Medicine

## 2019-04-29 DIAGNOSIS — I10 Essential (primary) hypertension: Secondary | ICD-10-CM

## 2019-04-30 DIAGNOSIS — E785 Hyperlipidemia, unspecified: Secondary | ICD-10-CM | POA: Diagnosis not present

## 2019-04-30 DIAGNOSIS — H5442A4 Blindness left eye category 4, normal vision right eye: Secondary | ICD-10-CM | POA: Diagnosis not present

## 2019-04-30 DIAGNOSIS — I10 Essential (primary) hypertension: Secondary | ICD-10-CM | POA: Diagnosis not present

## 2019-04-30 DIAGNOSIS — I69398 Other sequelae of cerebral infarction: Secondary | ICD-10-CM | POA: Diagnosis not present

## 2019-04-30 DIAGNOSIS — F0390 Unspecified dementia without behavioral disturbance: Secondary | ICD-10-CM | POA: Diagnosis not present

## 2019-04-30 DIAGNOSIS — G40909 Epilepsy, unspecified, not intractable, without status epilepticus: Secondary | ICD-10-CM | POA: Diagnosis not present

## 2019-05-03 DIAGNOSIS — I69328 Other speech and language deficits following cerebral infarction: Secondary | ICD-10-CM | POA: Diagnosis not present

## 2019-05-03 DIAGNOSIS — F0390 Unspecified dementia without behavioral disturbance: Secondary | ICD-10-CM | POA: Diagnosis not present

## 2019-05-03 DIAGNOSIS — R269 Unspecified abnormalities of gait and mobility: Secondary | ICD-10-CM | POA: Diagnosis not present

## 2019-05-03 DIAGNOSIS — E785 Hyperlipidemia, unspecified: Secondary | ICD-10-CM | POA: Diagnosis not present

## 2019-05-03 DIAGNOSIS — I10 Essential (primary) hypertension: Secondary | ICD-10-CM | POA: Diagnosis not present

## 2019-05-03 DIAGNOSIS — H5442A4 Blindness left eye category 4, normal vision right eye: Secondary | ICD-10-CM | POA: Diagnosis not present

## 2019-05-03 DIAGNOSIS — I6012 Nontraumatic subarachnoid hemorrhage from left middle cerebral artery: Secondary | ICD-10-CM | POA: Diagnosis not present

## 2019-05-03 DIAGNOSIS — I69398 Other sequelae of cerebral infarction: Secondary | ICD-10-CM | POA: Diagnosis not present

## 2019-05-03 DIAGNOSIS — H9190 Unspecified hearing loss, unspecified ear: Secondary | ICD-10-CM | POA: Diagnosis not present

## 2019-05-03 DIAGNOSIS — G40909 Epilepsy, unspecified, not intractable, without status epilepticus: Secondary | ICD-10-CM | POA: Diagnosis not present

## 2019-05-05 DIAGNOSIS — R2681 Unsteadiness on feet: Secondary | ICD-10-CM | POA: Diagnosis not present

## 2019-05-05 DIAGNOSIS — F0390 Unspecified dementia without behavioral disturbance: Secondary | ICD-10-CM | POA: Diagnosis not present

## 2019-05-05 DIAGNOSIS — M6281 Muscle weakness (generalized): Secondary | ICD-10-CM | POA: Diagnosis not present

## 2019-05-05 DIAGNOSIS — R269 Unspecified abnormalities of gait and mobility: Secondary | ICD-10-CM | POA: Diagnosis not present

## 2019-05-07 DIAGNOSIS — R2681 Unsteadiness on feet: Secondary | ICD-10-CM | POA: Diagnosis not present

## 2019-05-07 DIAGNOSIS — F0390 Unspecified dementia without behavioral disturbance: Secondary | ICD-10-CM | POA: Diagnosis not present

## 2019-05-07 DIAGNOSIS — M6281 Muscle weakness (generalized): Secondary | ICD-10-CM | POA: Diagnosis not present

## 2019-05-07 DIAGNOSIS — R269 Unspecified abnormalities of gait and mobility: Secondary | ICD-10-CM | POA: Diagnosis not present

## 2019-05-11 DIAGNOSIS — R269 Unspecified abnormalities of gait and mobility: Secondary | ICD-10-CM | POA: Diagnosis not present

## 2019-05-11 DIAGNOSIS — F0390 Unspecified dementia without behavioral disturbance: Secondary | ICD-10-CM | POA: Diagnosis not present

## 2019-05-11 DIAGNOSIS — R2681 Unsteadiness on feet: Secondary | ICD-10-CM | POA: Diagnosis not present

## 2019-05-11 DIAGNOSIS — M6281 Muscle weakness (generalized): Secondary | ICD-10-CM | POA: Diagnosis not present

## 2019-05-13 DIAGNOSIS — I69398 Other sequelae of cerebral infarction: Secondary | ICD-10-CM | POA: Diagnosis not present

## 2019-05-13 DIAGNOSIS — H5442A4 Blindness left eye category 4, normal vision right eye: Secondary | ICD-10-CM | POA: Diagnosis not present

## 2019-05-13 DIAGNOSIS — R2681 Unsteadiness on feet: Secondary | ICD-10-CM | POA: Diagnosis not present

## 2019-05-13 DIAGNOSIS — F0390 Unspecified dementia without behavioral disturbance: Secondary | ICD-10-CM | POA: Diagnosis not present

## 2019-05-13 DIAGNOSIS — G40909 Epilepsy, unspecified, not intractable, without status epilepticus: Secondary | ICD-10-CM | POA: Diagnosis not present

## 2019-05-13 DIAGNOSIS — L821 Other seborrheic keratosis: Secondary | ICD-10-CM | POA: Diagnosis not present

## 2019-05-13 DIAGNOSIS — E785 Hyperlipidemia, unspecified: Secondary | ICD-10-CM | POA: Diagnosis not present

## 2019-05-13 DIAGNOSIS — R269 Unspecified abnormalities of gait and mobility: Secondary | ICD-10-CM | POA: Diagnosis not present

## 2019-05-13 DIAGNOSIS — M6281 Muscle weakness (generalized): Secondary | ICD-10-CM | POA: Diagnosis not present

## 2019-05-13 DIAGNOSIS — I10 Essential (primary) hypertension: Secondary | ICD-10-CM | POA: Diagnosis not present

## 2019-05-16 DIAGNOSIS — I10 Essential (primary) hypertension: Secondary | ICD-10-CM | POA: Diagnosis not present

## 2019-05-18 DIAGNOSIS — R001 Bradycardia, unspecified: Secondary | ICD-10-CM | POA: Diagnosis not present

## 2019-05-18 DIAGNOSIS — F0390 Unspecified dementia without behavioral disturbance: Secondary | ICD-10-CM | POA: Diagnosis not present

## 2019-05-18 DIAGNOSIS — I10 Essential (primary) hypertension: Secondary | ICD-10-CM | POA: Diagnosis not present

## 2019-05-19 DIAGNOSIS — M6281 Muscle weakness (generalized): Secondary | ICD-10-CM | POA: Diagnosis not present

## 2019-05-19 DIAGNOSIS — F0390 Unspecified dementia without behavioral disturbance: Secondary | ICD-10-CM | POA: Diagnosis not present

## 2019-05-19 DIAGNOSIS — R2681 Unsteadiness on feet: Secondary | ICD-10-CM | POA: Diagnosis not present

## 2019-05-19 DIAGNOSIS — R269 Unspecified abnormalities of gait and mobility: Secondary | ICD-10-CM | POA: Diagnosis not present

## 2019-05-21 DIAGNOSIS — R2681 Unsteadiness on feet: Secondary | ICD-10-CM | POA: Diagnosis not present

## 2019-05-21 DIAGNOSIS — M6281 Muscle weakness (generalized): Secondary | ICD-10-CM | POA: Diagnosis not present

## 2019-05-21 DIAGNOSIS — R269 Unspecified abnormalities of gait and mobility: Secondary | ICD-10-CM | POA: Diagnosis not present

## 2019-05-21 DIAGNOSIS — F0390 Unspecified dementia without behavioral disturbance: Secondary | ICD-10-CM | POA: Diagnosis not present

## 2019-07-20 ENCOUNTER — Other Ambulatory Visit: Payer: Self-pay | Admitting: Internal Medicine

## 2019-07-20 DIAGNOSIS — F0390 Unspecified dementia without behavioral disturbance: Secondary | ICD-10-CM

## 2019-07-20 DIAGNOSIS — E785 Hyperlipidemia, unspecified: Secondary | ICD-10-CM

## 2019-10-15 ENCOUNTER — Other Ambulatory Visit: Payer: Self-pay | Admitting: Internal Medicine

## 2019-10-15 DIAGNOSIS — F0390 Unspecified dementia without behavioral disturbance: Secondary | ICD-10-CM

## 2019-10-15 DIAGNOSIS — E785 Hyperlipidemia, unspecified: Secondary | ICD-10-CM

## 2019-12-20 ENCOUNTER — Other Ambulatory Visit: Payer: Self-pay | Admitting: Internal Medicine

## 2019-12-20 DIAGNOSIS — E785 Hyperlipidemia, unspecified: Secondary | ICD-10-CM

## 2019-12-27 ENCOUNTER — Other Ambulatory Visit: Payer: Self-pay | Admitting: Internal Medicine

## 2019-12-27 DIAGNOSIS — E785 Hyperlipidemia, unspecified: Secondary | ICD-10-CM

## 2020-03-22 ENCOUNTER — Other Ambulatory Visit: Payer: Self-pay | Admitting: Internal Medicine

## 2020-03-22 DIAGNOSIS — E785 Hyperlipidemia, unspecified: Secondary | ICD-10-CM

## 2022-10-29 ENCOUNTER — Emergency Department (HOSPITAL_COMMUNITY): Payer: Medicare Other

## 2022-10-29 ENCOUNTER — Inpatient Hospital Stay (HOSPITAL_COMMUNITY)
Admission: EM | Admit: 2022-10-29 | Discharge: 2022-10-31 | DRG: 640 | Disposition: A | Discharge status: Home Health | Payer: Medicare Other | Attending: Family Medicine | Admitting: Family Medicine

## 2022-10-29 ENCOUNTER — Other Ambulatory Visit: Payer: Self-pay

## 2022-10-29 ENCOUNTER — Encounter (HOSPITAL_COMMUNITY): Payer: Self-pay

## 2022-10-29 DIAGNOSIS — F0390 Unspecified dementia without behavioral disturbance: Secondary | ICD-10-CM | POA: Diagnosis present

## 2022-10-29 DIAGNOSIS — Z8673 Personal history of transient ischemic attack (TIA), and cerebral infarction without residual deficits: Secondary | ICD-10-CM

## 2022-10-29 DIAGNOSIS — Z888 Allergy status to other drugs, medicaments and biological substances status: Secondary | ICD-10-CM

## 2022-10-29 DIAGNOSIS — Z7984 Long term (current) use of oral hypoglycemic drugs: Secondary | ICD-10-CM

## 2022-10-29 DIAGNOSIS — I959 Hypotension, unspecified: Secondary | ICD-10-CM | POA: Diagnosis present

## 2022-10-29 DIAGNOSIS — E860 Dehydration: Principal | ICD-10-CM | POA: Diagnosis present

## 2022-10-29 DIAGNOSIS — Z79899 Other long term (current) drug therapy: Secondary | ICD-10-CM

## 2022-10-29 DIAGNOSIS — I10 Essential (primary) hypertension: Secondary | ICD-10-CM | POA: Diagnosis present

## 2022-10-29 DIAGNOSIS — N170 Acute kidney failure with tubular necrosis: Secondary | ICD-10-CM | POA: Diagnosis present

## 2022-10-29 DIAGNOSIS — R55 Syncope and collapse: Secondary | ICD-10-CM | POA: Diagnosis not present

## 2022-10-29 DIAGNOSIS — Z880 Allergy status to penicillin: Secondary | ICD-10-CM

## 2022-10-29 DIAGNOSIS — G40909 Epilepsy, unspecified, not intractable, without status epilepticus: Secondary | ICD-10-CM | POA: Diagnosis present

## 2022-10-29 DIAGNOSIS — Z87891 Personal history of nicotine dependence: Secondary | ICD-10-CM

## 2022-10-29 DIAGNOSIS — R001 Bradycardia, unspecified: Secondary | ICD-10-CM | POA: Diagnosis present

## 2022-10-29 DIAGNOSIS — E785 Hyperlipidemia, unspecified: Secondary | ICD-10-CM | POA: Diagnosis present

## 2022-10-29 DIAGNOSIS — E119 Type 2 diabetes mellitus without complications: Secondary | ICD-10-CM | POA: Diagnosis present

## 2022-10-29 LAB — TROPONIN I (HIGH SENSITIVITY)
Troponin I (High Sensitivity): 7 ng/L (ref <18)
Troponin I (High Sensitivity): 8 ng/L (ref <18)

## 2022-10-29 LAB — COMPREHENSIVE METABOLIC PANEL
ALT: 15 U/L (ref 0–44)
AST: 15 U/L (ref 15–41)
Albumin: 3.9 g/dL (ref 3.5–5.0)
Alkaline Phosphatase: 62 U/L (ref 38–126)
Anion gap: 12 (ref 5–15)
BUN: 33 mg/dL — ABNORMAL HIGH (ref 8–23)
CO2: 26 mmol/L (ref 22–32)
Calcium: 9.9 mg/dL (ref 8.9–10.3)
Chloride: 104 mmol/L (ref 98–111)
Creatinine, Ser: 1.37 mg/dL — ABNORMAL HIGH (ref 0.61–1.24)
GFR, Estimated: 50 mL/min — ABNORMAL LOW (ref >60)
Glucose, Bld: 109 mg/dL — ABNORMAL HIGH (ref 70–99)
Potassium: 3.9 mmol/L (ref 3.5–5.1)
Sodium: 142 mmol/L (ref 135–145)
Total Bilirubin: 1.2 mg/dL (ref 0.3–1.2)
Total Protein: 7.4 g/dL (ref 6.5–8.1)

## 2022-10-29 LAB — URINALYSIS, ROUTINE W REFLEX MICROSCOPIC
Bilirubin Urine: NEGATIVE
Glucose, UA: NEGATIVE mg/dL
Hgb urine dipstick: NEGATIVE
Ketones, ur: 5 mg/dL — AB
Leukocytes,Ua: NEGATIVE
Nitrite: NEGATIVE
Protein, ur: NEGATIVE mg/dL
Specific Gravity, Urine: 1.019 (ref 1.005–1.030)
pH: 5 (ref 5.0–8.0)

## 2022-10-29 LAB — CBC
HCT: 41.5 % (ref 39.0–52.0)
Hemoglobin: 13.3 g/dL (ref 13.0–17.0)
MCH: 27.9 pg (ref 26.0–34.0)
MCHC: 32 g/dL (ref 30.0–36.0)
MCV: 87.2 fL (ref 80.0–100.0)
Platelets: 226 10*3/uL (ref 150–400)
RBC: 4.76 MIL/uL (ref 4.22–5.81)
RDW: 13.9 % (ref 11.5–15.5)
WBC: 6.2 10*3/uL (ref 4.0–10.5)
nRBC: 0 % (ref 0.0–0.2)

## 2022-10-29 LAB — CBG MONITORING, ED: Glucose-Capillary: 99 mg/dL (ref 70–99)

## 2022-10-29 LAB — GLUCOSE, CAPILLARY: Glucose-Capillary: 83 mg/dL (ref 70–99)

## 2022-10-29 MED ORDER — ACETAMINOPHEN 325 MG PO TABS: 650.0000 mg | ORAL_TABLET | Freq: Four times a day (QID) | ORAL | Status: DC | PRN

## 2022-10-29 MED ORDER — LEVETIRACETAM ER 750 MG PO TB24
2000.0000 mg | ORAL_TABLET | Freq: Every day | ORAL | Status: DC
  Administered 2022-10-29 – 2022-10-31 (×3): 2000 mg via ORAL
  Filled 2022-10-29 (×3): qty 1

## 2022-10-29 MED ORDER — ACETAMINOPHEN 650 MG RE SUPP: 650.0000 mg | Freq: Four times a day (QID) | RECTAL | Status: DC | PRN

## 2022-10-29 MED ORDER — INSULIN ASPART 100 UNIT/ML IJ SOLN
0.0000 [IU] | Freq: Every day | INTRAMUSCULAR | Status: DC
  Filled 2022-10-29: qty 0.05

## 2022-10-29 MED ORDER — SODIUM CHLORIDE 0.9 % IV SOLN: INTRAVENOUS | Status: AC

## 2022-10-29 MED ORDER — INSULIN ASPART 100 UNIT/ML IJ SOLN
0.0000 [IU] | Freq: Three times a day (TID) | INTRAMUSCULAR | Status: DC
  Filled 2022-10-29: qty 0.15

## 2022-10-29 MED ORDER — DONEPEZIL HCL 10 MG PO TABS
10.0000 mg | ORAL_TABLET | Freq: Every day | ORAL | Status: DC
  Administered 2022-10-29 – 2022-10-31 (×3): 10 mg via ORAL
  Filled 2022-10-29: qty 1
  Filled 2022-10-29: qty 2
  Filled 2022-10-29: qty 1

## 2022-10-29 MED ORDER — TRAZODONE HCL 50 MG PO TABS: 25.0000 mg | ORAL_TABLET | Freq: Every evening | ORAL | Status: DC | PRN

## 2022-10-29 MED ORDER — ENOXAPARIN SODIUM 40 MG/0.4ML IJ SOSY
40.0000 mg | PREFILLED_SYRINGE | INTRAMUSCULAR | Status: DC
  Administered 2022-10-29 – 2022-10-30 (×2): 40 mg via SUBCUTANEOUS
  Filled 2022-10-29 (×2): qty 0.4

## 2022-10-29 MED ORDER — ONDANSETRON HCL 4 MG PO TABS: 4.0000 mg | ORAL_TABLET | Freq: Four times a day (QID) | ORAL | Status: DC | PRN

## 2022-10-29 MED ORDER — ATORVASTATIN CALCIUM 20 MG PO TABS
20.0000 mg | ORAL_TABLET | Freq: Every day | ORAL | Status: DC
  Administered 2022-10-29 – 2022-10-31 (×3): 20 mg via ORAL
  Filled 2022-10-29: qty 1
  Filled 2022-10-29: qty 2
  Filled 2022-10-29: qty 1

## 2022-10-29 MED ORDER — ALBUTEROL SULFATE (2.5 MG/3ML) 0.083% IN NEBU: 2.5000 mg | INHALATION_SOLUTION | RESPIRATORY_TRACT | Status: DC | PRN

## 2022-10-29 MED ORDER — ONDANSETRON HCL 4 MG/2ML IJ SOLN: 4.0000 mg | Freq: Four times a day (QID) | INTRAMUSCULAR | Status: DC | PRN

## 2022-10-29 NOTE — ED Provider Notes (Signed)
  Physical Exam  BP 133/64 (BP Location: Left Arm)   Pulse (!) 54   Temp (!) 97 F (36.1 C) (Rectal)   Resp 13   Ht 6' 2.5" (1.892 m)   Wt 98 kg   SpO2 95%   BMI 27.37 kg/m   Physical Exam  Procedures  Procedures  ED Course / MDM    Medical Decision Making Amount and/or Complexity of Data Reviewed Labs: ordered. Radiology: ordered.   88 year old male here today after a syncopal episode.  Patient was signed out pending formal CT imaging, admission.  CT imaging does not show any acute changes.  I reviewed the patient's labs, normal hemoglobin, kidney function at baseline.  Given patient's age, high risk syncope.  Will admit for syncope observation.       Anders Simmonds T, DO 10/29/22 [phone]

## 2022-10-29 NOTE — H&P (Signed)
History and Physical  William Mills BJY:[id number] DOB: August 14, 1934 DOA: 10/29/2022  PCP: Etta Grandchild, MD   Chief Complaint: Syncope  HPI: William Mills is a 88 y.o. male with medical history significant for dementia, hypertension, hyperlipidemia, seizure disorder prior CVA being admitted to the hospital with complaint of syncope.  He moved down here from [place] to live with his daughter and son-in-law about 1 week ago.  They state he has been doing well, he has had improved lower extremity edema after his improve diet since living with them.  He has been eating and drinking well, as far as he has been urinating well.  He does need assistance with his ADLs, has an appointment soon to establish care with [facility].  Today his daughter was finishing bathing him while he was standing up, and he somehow lost consciousness, there was no complaint from him prior to this.  He became limp, and started having some slight myoclonic movements.  It was more just in his upper arms and shoulders, and he was drooling.  He did not fall to the ground, daughter and son-in-law were able to get him into bed.  They say that he continued shaking for probably 3 to 5 minutes, and did not regain consciousness probably for about 10 minutes.  When he woke up, he seemed to be a little bit confused and disoriented was not really responding to anything for a little while.  They deny any recent fevers, chills, nausea, vomiting.  No changes in his medication or any new medication.  ED Course: EMS was called and checked his blood pressure and blood sugar which were good, however his blood pressure which was initially 140/80 dropped to 83/61 standing.  He was given some fluid by EMS, and upon arrival here he has been afebrile, heart rate 49, blood pressure 144/66 and saturating 97% on room air.  Review of Systems: Please see HPI for pertinent positives and negatives. A complete 10 system review of systems are otherwise  negative.  Past Medical History:  Diagnosis Date   Cerebrovascular accident (HCC)    Dementia (HCC)    Head injury 1970   right frontal   Hyperlipidemia    Hypertension    Seizure disorder [facility])    Past Surgical History:  Procedure Laterality Date   release of right undescended testicle      Social History:  reports that he quit smoking about 14 years ago. He has never used smokeless tobacco. He reports that he does not drink alcohol and does not use drugs.   Allergies  Allergen Reactions   Other Other (See Comments)    Can have NO MRI(s) because of a metal plate in his head   Nifedipine Other (See Comments)    Edema    Carbamazepine Other (See Comments)    Reaction unknown   Penicillins Other (See Comments)    Reaction unknown   Phenytoin Other (See Comments)    Reaction unknown    History reviewed. No pertinent family history.   Prior to Admission medications   Medication Sig Start Date End Date Taking? Authorizing Provider  amLODipine (NORVASC) 10 MG tablet Take 10 mg by mouth in the morning.   Yes [provider]  atorvastatin (LIPITOR) 20 MG tablet take 1 tablet by mouth once daily Patient taking differently: Take 20 mg by mouth in the morning. 12/27/19  Yes Etta Grandchild, MD  BAYER LOW DOSE 81 MG tablet Take 81 mg by mouth in the  morning. Swallow whole.   Yes [provider]  brimonidine-timolol (COMBIGAN) 0.2-0.5 % ophthalmic solution Place 1 drop into the right eye 2 (two) times daily. 04/02/11  Yes Etta Grandchild, MD  COMBIGAN 0.2-0.5 % ophthalmic solution Place 1 drop into the right eye every 12 (twelve) hours.   Yes [provider]  donepezil (ARICEPT) 10 MG tablet Take 10 mg by mouth in the morning.   Yes [provider]  furosemide (LASIX) 20 MG tablet Take 40 mg by mouth in the morning.   Yes [provider]  irbesartan-hydrochlorothiazide (AVALIDE) 150-12.5 MG tablet Take 1 tablet by mouth daily. 01/25/19  Yes  Etta Grandchild, MD  levETIRAcetam (KEPPRA XR) 500 MG 24 hr tablet Take 2,000 mg by mouth in the morning.   Yes [provider]  metFORMIN (GLUCOPHAGE) 500 MG tablet Take 500 mg by mouth daily with breakfast.   Yes [provider]  potassium chloride SA (KLOR-CON M) 20 MEQ tablet Take 40 mEq by mouth in the morning.   Yes [provider]  bimatoprost (LUMIGAN) 0.01 % SOLN INSTILL ONE DROP INTO LEFT EYE EVERY DAY AT BEDTIME Patient not taking: Reported on 10/29/2022 04/02/11   Etta Grandchild, MD  carvedilol (COREG) 3.125 MG tablet take 1 tablet by mouth twice a day with meals Patient not taking: Reported on 10/29/2022 04/24/19   Etta Grandchild, MD  donepezil (ARICEPT) 5 MG tablet take 1 tablet by mouth once daily Patient not taking: Reported on 10/29/2022 07/20/19   Etta Grandchild, MD  levETIRAcetam (KEPPRA) 1000 MG tablet Take 1 tablet (1,000 mg total) by mouth 2 (two) times daily. Patient not taking: Reported on 10/29/2022 01/25/19   Etta Grandchild, MD    Physical Exam: BP 133/64 (BP Location: Left Arm)   Pulse (!) 54   Temp (!) 97 F (36.1 C) (Rectal)   Resp 13   Ht 6' 2.5" (1.892 m)   Wt 98 kg   SpO2 95%   BMI 27.37 kg/m   General:  Alert, oriented only to self, calm, in no acute distress, daughter and son-in-law at the bedside Eyes: EOMI, clear conjuctivae, white sclerea Neck: supple, no masses, trachea mildline  Cardiovascular: RRR, no murmurs or rubs, no peripheral edema  Respiratory: clear to auscultation bilaterally, no wheezes, no crackles  Abdomen: soft, nontender, nondistended, normal bowel tones heard  Skin: dry, no rashes  Musculoskeletal: no joint effusions, normal range of motion  Psychiatric: appropriate affect, normal speech  Neurologic: extraocular muscles intact, clear speech, moving all extremities with intact sensorium         Labs on Admission:  Basic Metabolic Panel: Recent Labs  Lab 10/29/22 1448  NA 142  K 3.9  CL 104  CO2 26   GLUCOSE 109*  BUN 33*  CREATININE 1.37*  CALCIUM 9.9   Liver Function Tests: Recent Labs  Lab 10/29/22 1448  AST 15  ALT 15  ALKPHOS 62  BILITOT 1.2  PROT 7.4  ALBUMIN 3.9   No results for input(s): "LIPASE", "AMYLASE" in the last 168 hours. No results for input(s): "AMMONIA" in the last 168 hours. CBC: Recent Labs  Lab 10/29/22 1448  WBC 6.2  HGB 13.3  HCT 41.5  MCV 87.2  PLT 226   Cardiac Enzymes: No results for input(s): "CKTOTAL", "CKMB", "CKMBINDEX", "TROPONINI" in the last 168 hours.  BNP (last 3 results) No results for input(s): "BNP" in the last 8760 hours.  ProBNP (last 3 results) No  results for input(s): "PROBNP" in the last 8760 hours.  CBG: No results for input(s): "GLUCAP" in the last 168 hours.  Radiological Exams on Admission: CT Head Wo Contrast  Result Date: 10/29/2022 CLINICAL DATA:  Mental status change pre syncopal EXAM: CT HEAD WITHOUT CONTRAST TECHNIQUE: Contiguous axial images were obtained from the base of the skull through the vertex without intravenous contrast. RADIATION DOSE REDUCTION: This exam was performed according to the departmental dose-optimization program which includes automated exposure control, adjustment of the mA and/or kV according to patient size and/or use of iterative reconstruction technique. COMPARISON:  CT brain 09/03/2013 FINDINGS: Brain: No acute territorial infarction, hemorrhage or intracranial mass. Large chronic left temporoparietal infarct with encephalomalacia. Atrophy. Patchy white matter hypodensity consistent with chronic small vessel ischemic change. Right posterior shunt catheter with tip terminating in the body of the right lateral ventricle. Slight interval increase in size of the ventricles compared to prior CT, suspect related to atrophy progression Vascular: No hyperdense vessels. Carotid vascular calcification and metallic coils or clips at the left carotid bifurcation. Skull: No fracture.  Chronic left  craniotomy changes. Sinuses/Orbits: No acute finding. Other: None IMPRESSION: 1. No CT evidence for acute intracranial abnormality. 2. Atrophy and chronic small vessel ischemic changes of the white matter. 3. Large chronic left temporoparietal infarct as before. 4. Right posterior shunt catheter with tip terminating in the body of the right lateral ventricle. Slight interval increase in size of the ventricles compared to prior CT, suspect related to atrophy progression. Electronically Signed   By: Jasmine Pang M.D.   On: 10/29/2022 16:02   DG Chest Port 1 View  Result Date: 10/29/2022 CLINICAL DATA:  Presyncope and weakness with bradycardia EXAM: PORTABLE CHEST 1 VIEW COMPARISON:  None Available. FINDINGS: Partially imaged ventriculoperitoneal shunt catheter courses over the right neck and chest. Normal lung volumes. No focal consolidations. No pleural effusion or pneumothorax. The heart size and mediastinal contours are within normal limits. No acute osseous abnormality. IMPRESSION: No active disease. Electronically Signed   By: Agustin Cree M.D.   On: 10/29/2022 15:42    Assessment/Plan William Mills is a 88 y.o. male with medical history significant for dementia, diabetes, hypertension, hyperlipidemia, seizure disorder prior CVA being admitted to the hospital with complaint of syncope.   Syncopal episode-unclear etiology, patient is clinically dehydrated and orthostatic, however he does have a history of epilepsy with some seizure-like movements today, and some post syncopal confusion -Observation -IV fluids -Avoid beta-blocker and antihypertensive -Check orthostatics in the morning after hydration -PT/OT -2D echo -EEG  Acute kidney injury-patient with baseline normal renal function, presumably due to ATN from hypotension and continued nephrotoxins including Lasix -Discontinue nephrotoxins -Hold antihypertensives for now given initial hypotension -Hydrate gently as above -Recheck renal  function with morning labs  History of seizure-this has been a couple of decades ago, he has been on a stable dose of Keppra -Continue Keppra -Check EEG -Consider inpatient neurology consultation pending EEG results, and further symptomatology.  Otherwise may just benefit from outpatient neurology evaluation.  Type 2 diabetes-non-insulin-dependent -Hold metformin -Carb controlled diet -Check hemoglobin A1c -Moderate dose sliding scale insulin  Hyperlipidemia-Lipitor  Dementia-continue donepezil  DVT prophylaxis: Lovenox     Code Status: Full Code-discussed with daughter who is POA at the bedside, this is not something they have contemplated before, would prefer for him to be full code at this time.  I encouraged them to discuss amongst themselves and with his PCP.  Consults called: None  Admission status:  Observation  Time spent: 59 minutes  Roma Bondar Sharlette Dense MD [facility] Pager [phone]  If 7PM-7AM, please contact night-coverage [email or web address] Password [email or web address]  10/29/2022, 5:03 PM

## 2022-10-29 NOTE — ED Triage Notes (Signed)
Patient BIB [facility] from home. Daughter was bathing patient and patient began to feel like passing out. Patient had a previous stroke with cognitive deficits. Blood pressure after standing went from 140/80 to 86/30.  EMS CBG 160 fluid given

## 2022-10-29 NOTE — ED Provider Notes (Signed)
[place] [facility] AT [facility] Provider Note   CSN: [id number] Arrival date & time: 10/29/22  1335     History  Chief Complaint  Patient presents with   Near Syncope    William Mills is a 88 y.o. male.  HPI   88 year old male history of prior stroke, high blood pressure, presents today with report of syncopal episode.  His daughter [place]  She states today she had him standing up in the bathroom to help bathe him.  He became unresponsive and she had to support his weight.  She had to call her husband and they moved him into the bedroom where he would lay him on the bed which time he regained consciousness.  She does describe some episodes of jerking that sound like tonic clonic jerks with his syncope.  He came around he was back to baseline pretty quickly.  Home Medications Prior to Admission medications   Medication Sig Start Date End Date Taking? Authorizing Provider  atorvastatin (LIPITOR) 20 MG tablet take 1 tablet by mouth once daily 12/27/19   Etta Grandchild, MD  bimatoprost (LUMIGAN) 0.01 % SOLN INSTILL ONE DROP INTO LEFT EYE EVERY DAY AT BEDTIME 04/02/11   Etta Grandchild, MD  brimonidine-timolol (COMBIGAN) 0.2-0.5 % ophthalmic solution Place 1 drop into the right eye 2 (two) times daily. 04/02/11   Etta Grandchild, MD  carvedilol (COREG) 3.125 MG tablet take 1 tablet by mouth twice a day with meals 04/24/19   Etta Grandchild, MD  donepezil (ARICEPT) 5 MG tablet take 1 tablet by mouth once daily 07/20/19   Etta Grandchild, MD  dorzolamide (TRUSOPT) 2 % ophthalmic solution Place 1 drop into both eyes 2 (two) times daily. 03/13/15   [provider]  irbesartan-hydrochlorothiazide (AVALIDE) 150-12.5 MG tablet Take 1 tablet by mouth daily. 01/25/19   Etta Grandchild, MD  levETIRAcetam (KEPPRA) 1000 MG tablet Take 1 tablet (1,000 mg total) by mouth 2 (two) times daily. 01/25/19   Etta Grandchild, MD       Allergies    Nifedipine, Carbamazepine, Penicillins, and Phenytoin    Review of Systems   Review of Systems  Physical Exam Updated Vital Signs BP 133/64 (BP Location: Left Arm)   Pulse (!) 54   Temp (!) 97 F (36.1 C) (Rectal)   Resp 13   Ht 1.892 m (6' 2.5")   Wt 98 kg   SpO2 95%   BMI 27.37 kg/m  Physical Exam Vitals and nursing note reviewed.  Constitutional:      Appearance: Normal appearance.  HENT:     Head: Normocephalic and atraumatic.     Right Ear: External ear normal.     Left Ear: External ear normal.     Mouth/Throat:     Mouth: Mucous membranes are moist.  Eyes:     Extraocular Movements: Extraocular movements intact.     Comments: Left eye with opacity Right eye normal  Cardiovascular:     Rate and Rhythm: Normal rate and regular rhythm.     Pulses: Normal pulses.  Pulmonary:     Effort: Pulmonary effort is normal.     Breath sounds: Normal breath sounds.  Abdominal:     General: Abdomen is flat.     Palpations: Abdomen is soft.  Musculoskeletal:        General: Normal range of motion.     Cervical back: Normal range of motion.  Skin:    General: Skin is warm.     Capillary Refill: Capillary refill takes less than 2 seconds.  Neurological:     General: No focal deficit present.     Mental Status: He is alert.     Cranial Nerves: No cranial nerve deficit.     Sensory: No sensory deficit.     Motor: No weakness.     Coordination: Coordination normal.  Psychiatric:        Mood and Affect: Mood normal.     ED Results / Procedures / Treatments   Labs (all labs ordered are listed, but only abnormal results are displayed) Labs Reviewed  COMPREHENSIVE METABOLIC PANEL - Abnormal; Notable for the following components:      Result Value   Glucose, Bld 109 (*)    BUN 33 (*)    Creatinine, Ser 1.37 (*)    GFR, Estimated 50 (*)    All other components within normal limits  CBC  URINALYSIS, ROUTINE W REFLEX MICROSCOPIC  TROPONIN I (HIGH  SENSITIVITY)  TROPONIN I (HIGH SENSITIVITY)    EKG EKG Interpretation Date/Time:  Tuesday October 29 2022 13:49:28 EDT Ventricular Rate:  50 PR Interval:  181 QRS Duration:  87 QT Interval:  456 QTC Calculation: 416 R Axis:   34  Text Interpretation: Sinus rhythm Ventricular premature complex Abnormal R-wave progression, early transition nonspecific st changes, No significant change since last tracing 16 Jan 2016 Confirmed by Margarita Grizzle [phone]) on 10/29/2022 3:57:59 PM  Radiology DG Chest Port 1 View  Result Date: 10/29/2022 CLINICAL DATA:  Presyncope and weakness with bradycardia EXAM: PORTABLE CHEST 1 VIEW COMPARISON:  None Available. FINDINGS: Partially imaged ventriculoperitoneal shunt catheter courses over the right neck and chest. Normal lung volumes. No focal consolidations. No pleural effusion or pneumothorax. The heart size and mediastinal contours are within normal limits. No acute osseous abnormality. IMPRESSION: No active disease. Electronically Signed   By: Agustin Cree M.D.   On: 10/29/2022 15:42    Procedures .Critical Care  Performed by: Margarita Grizzle, MD Authorized by: Margarita Grizzle, MD   Critical care provider statement:    Critical care time (minutes):  30   Critical care was time spent personally by me on the following activities:  Development of treatment plan with patient or surrogate, discussions with consultants, evaluation of patient's response to treatment, examination of patient, ordering and review of laboratory studies, ordering and review of radiographic studies, ordering and performing treatments and interventions, pulse oximetry, re-evaluation of patient's condition and review of old charts     Medications Ordered in ED Medications - No data to display  ED Course/ Medical Decision Making/ A&P                                 Medical Decision Making Amount and/or Complexity of Data Reviewed Labs: ordered. Radiology: ordered.   88 year old  male history of prior stroke, cerebral shunt in place, high blood pressure, presents today with syncopal episode. Differential diagnosis includes but is not limited to Cardiac arrhythmia Hypotension-question secondary to volume depletion versus medication versus infection or other etiologies Seizure other primary cerebral etiologies Patient with EKG with normal sinus rhythm and no changes from prior with normal troponins Report of hypotension from EMS, normal tensive here Patient will likely need admission for further evaluation and treatment CT head pending at this time Care discussed Dr. Andria Meuse who will disposition after all tests  have resulted        Final Clinical Impression(s) / ED Diagnoses Final diagnoses:  Syncope and collapse    Rx / DC Orders ED Discharge Orders     None         Margarita Grizzle, MD 10/29/22 [phone]

## 2022-10-29 NOTE — ED Notes (Signed)
ED TO INPATIENT HANDOFF REPORT  ED Nurse Name and Phone #:  Mellody Dance  [phone]  S Name/Age/Gender William Mills 88 y.o. male Room/Bed: WA22/WA22  Code Status   Code Status: Full Code  Home/SNF/Other Home Patient oriented to: self, place, time, and situation Is this baseline? Yes   Triage Complete: Triage complete  Chief Complaint Syncope [R55]  Triage Note Patient BIB [facility] from home. Daughter was bathing patient and patient began to feel like passing out. Patient had a previous stroke with cognitive deficits. Blood pressure after standing went from 140/80 to 86/30.  EMS CBG 160 fluid given   Allergies Allergies  Allergen Reactions   Other Other (See Comments)    Can have NO MRI(s) because of a metal plate in his head   Nifedipine Other (See Comments)    Edema    Carbamazepine Other (See Comments)    Reaction unknown   Penicillins Other (See Comments)    Reaction unknown   Phenytoin Other (See Comments)    Reaction unknown    Level of Care/Admitting Diagnosis ED Disposition     ED Disposition  Admit   Condition  --   Comment  Hospital Area: [facility] [[id number]]  Level of Care: Telemetry [5]  Admit to tele based on following criteria: Eval of Syncope  May place patient in observation at [facility] or [facility] if equivalent level of care is available:: Yes  Covid Evaluation: Asymptomatic - no recent exposure (last 10 days) testing not required  Diagnosis: Syncope [206001]  Admitting Physician: Maryln Gottron [[id number]]  Attending Physician: Kirby Crigler, MIR M [[id number]]          B Medical/Surgery History Past Medical History:  Diagnosis Date   Cerebrovascular accident (HCC)    Dementia (HCC)    Head injury 1970   right frontal   Hyperlipidemia    Hypertension    Seizure disorder (HCC)    Past Surgical History:  Procedure Laterality Date   release of right undescended testicle       A IV  Location/Drains/Wounds Patient Lines/Drains/Airways Status     Active Line/Drains/Airways     None            Intake/Output Last 24 hours No intake or output data in the 24 hours ending 10/29/22 1858  Labs/Imaging Results for orders placed or performed during the hospital encounter of 10/29/22 (from the past 48 hour(s))  CBC     Status: None   Collection Time: 10/29/22  2:48 PM  Result Value Ref Range   WBC 6.2 4.0 - 10.5 K/uL   RBC 4.76 4.22 - 5.81 MIL/uL   Hemoglobin 13.3 13.0 - 17.0 g/dL   HCT 02.5 42.7 - 06.2 %   MCV 87.2 80.0 - 100.0 fL   MCH 27.9 26.0 - 34.0 pg   MCHC 32.0 30.0 - 36.0 g/dL   RDW 37.6 28.3 - 15.1 %   Platelets 226 150 - 400 K/uL   nRBC 0.0 0.0 - 0.2 %    Comment: Performed at [facility], [address], [place], [place] [address]  Comprehensive metabolic panel     Status: Abnormal   Collection Time: 10/29/22  2:48 PM  Result Value Ref Range   Sodium 142 135 - 145 mmol/L   Potassium 3.9 3.5 - 5.1 mmol/L   Chloride 104 98 - 111 mmol/L   CO2 26 22 - 32 mmol/L   Glucose, Bld 109 (H) 70 -  99 mg/dL    Comment: Glucose reference range applies only to samples taken after fasting for at least 8 hours.   BUN 33 (H) 8 - 23 mg/dL   Creatinine, Ser 1.61 (H) 0.61 - 1.24 mg/dL   Calcium 9.9 8.9 - 09.6 mg/dL   Total Protein 7.4 6.5 - 8.1 g/dL   Albumin 3.9 3.5 - 5.0 g/dL   AST 15 15 - 41 U/L   ALT 15 0 - 44 U/L   Alkaline Phosphatase 62 38 - 126 U/L   Total Bilirubin 1.2 0.3 - 1.2 mg/dL   GFR, Estimated 50 (L) >60 mL/min    Comment: (NOTE) Calculated using the CKD-EPI Creatinine Equation (2021)    Anion gap 12 5 - 15    Comment: Performed at [facility], [address] [place], [place] [address]  Troponin I (High Sensitivity)     Status: None   Collection Time: 10/29/22  2:48 PM  Result Value Ref Range   Troponin I (High Sensitivity) 7 <18 ng/L    Comment: (NOTE) Elevated high sensitivity troponin I (hsTnI)  values and significant  changes across serial measurements may suggest ACS but many other  chronic and acute conditions are known to elevate hsTnI results.  Refer to the "Links" section for chest pain algorithms and additional  guidance. Performed at [facility], [address] [place], [place] [address]   Urinalysis, Routine w reflex microscopic -Urine, Clean Catch     Status: Abnormal   Collection Time: 10/29/22  4:35 PM  Result Value Ref Range   Color, Urine YELLOW YELLOW   APPearance CLEAR CLEAR   Specific Gravity, Urine 1.019 1.005 - 1.030   pH 5.0 5.0 - 8.0   Glucose, UA NEGATIVE NEGATIVE mg/dL   Hgb urine dipstick NEGATIVE NEGATIVE   Bilirubin Urine NEGATIVE NEGATIVE   Ketones, ur 5 (A) NEGATIVE mg/dL   Protein, ur NEGATIVE NEGATIVE mg/dL   Nitrite NEGATIVE NEGATIVE   Leukocytes,Ua NEGATIVE NEGATIVE    Comment: Performed at [facility], [address], [place], [place] [address]  CBG monitoring, ED     Status: None   Collection Time: 10/29/22  6:03 PM  Result Value Ref Range   Glucose-Capillary 99 70 - 99 mg/dL    Comment: Glucose reference range applies only to samples taken after fasting for at least 8 hours.   CT Head Wo Contrast  Result Date: 10/29/2022 CLINICAL DATA:  Mental status change pre syncopal EXAM: CT HEAD WITHOUT CONTRAST TECHNIQUE: Contiguous axial images were obtained from the base of the skull through the vertex without intravenous contrast. RADIATION DOSE REDUCTION: This exam was performed according to the departmental dose-optimization program which includes automated exposure control, adjustment of the mA and/or kV according to patient size and/or use of iterative reconstruction technique. COMPARISON:  CT brain 09/03/2013 FINDINGS: Brain: No acute territorial infarction, hemorrhage or intracranial mass. Large chronic left temporoparietal infarct with encephalomalacia. Atrophy. Patchy white matter hypodensity  consistent with chronic small vessel ischemic change. Right posterior shunt catheter with tip terminating in the body of the right lateral ventricle. Slight interval increase in size of the ventricles compared to prior CT, suspect related to atrophy progression Vascular: No hyperdense vessels. Carotid vascular calcification and metallic coils or clips at the left carotid bifurcation. Skull: No fracture.  Chronic left craniotomy changes. Sinuses/Orbits: No acute finding. Other: None IMPRESSION: 1. No CT evidence for acute intracranial abnormality. 2. Atrophy and chronic small vessel ischemic changes  of the white matter. 3. Large chronic left temporoparietal infarct as before. 4. Right posterior shunt catheter with tip terminating in the body of the right lateral ventricle. Slight interval increase in size of the ventricles compared to prior CT, suspect related to atrophy progression. Electronically Signed   By: Jasmine Pang M.D.   On: 10/29/2022 16:02   DG Chest Port 1 View  Result Date: 10/29/2022 CLINICAL DATA:  Presyncope and weakness with bradycardia EXAM: PORTABLE CHEST 1 VIEW COMPARISON:  None Available. FINDINGS: Partially imaged ventriculoperitoneal shunt catheter courses over the right neck and chest. Normal lung volumes. No focal consolidations. No pleural effusion or pneumothorax. The heart size and mediastinal contours are within normal limits. No acute osseous abnormality. IMPRESSION: No active disease. Electronically Signed   By: Agustin Cree M.D.   On: 10/29/2022 15:42    Pending Labs Unresulted Labs (From admission, onward)     Start     Ordered   10/30/22 0500  Basic metabolic panel  Tomorrow morning,   R        10/29/22 1701   10/30/22 0500  CBC  Tomorrow morning,   R        10/29/22 1701   10/29/22 1703  Hemoglobin A1c  Once,   R       Comments: To assess prior glycemic control    10/29/22 1703            Vitals/Pain Today's Vitals   10/29/22 1341 10/29/22 1346 10/29/22  1517 10/29/22 1800  BP:  (!) 144/66 133/64 (!) 140/63  Pulse:  (!) 49 (!) 54 66  Resp:  20 13 16   Temp:  (!) 97 F (36.1 C)  (!) 97.5 F (36.4 C)  TempSrc:  Rectal  Oral  SpO2:  97% 95% 100%  Weight: 98 kg     Height: 6' 2.5" (1.892 m)     PainSc: 0-No pain       Isolation Precautions No active isolations  Medications Medications  0.9 %  sodium chloride infusion (has no administration in time range)  enoxaparin (LOVENOX) injection 40 mg (40 mg Subcutaneous Given 10/29/22 1857)  acetaminophen (TYLENOL) tablet 650 mg (has no administration in time range)    Or  acetaminophen (TYLENOL) suppository 650 mg (has no administration in time range)  traZODone (DESYREL) tablet 25 mg (has no administration in time range)  ondansetron (ZOFRAN) tablet 4 mg (has no administration in time range)    Or  ondansetron (ZOFRAN) injection 4 mg (has no administration in time range)  albuterol (PROVENTIL) (2.5 MG/3ML) 0.083% nebulizer solution 2.5 mg (has no administration in time range)  atorvastatin (LIPITOR) tablet 20 mg (20 mg Oral Given 10/29/22 1857)  donepezil (ARICEPT) tablet 10 mg (10 mg Oral Given 10/29/22 1857)  levETIRAcetam (KEPPRA XR) 24 hr tablet 2,000 mg (2,000 mg Oral Given 10/29/22 1857)  insulin aspart (novoLOG) injection 0-15 Units ( Subcutaneous Not Given 10/29/22 1807)  insulin aspart (novoLOG) injection 0-5 Units (has no administration in time range)    Mobility walks with device     Focused Assessments    R Recommendations: See Admitting Provider Note  Report given to:   Additional Notes:

## 2022-10-29 NOTE — ED Notes (Addendum)
   10/29/22 1932  Hand-off documentation  Hand-off Received Received from ED RN, ready to receive patient  Report received from (Full Name) Per Chart review and SBAR    2130 Pt received, alert and oriented to self, place, and situation. Daughter is at bedside. Pt answers questions appropriately. Pt is con't of urine and ask for the bathroom. Pt voided 250 ml yellow clear urine in the urinal. Resp even and unlabored. Tele box connected, SB 59 pt denies any acute distress.

## 2022-10-30 ENCOUNTER — Inpatient Hospital Stay (HOSPITAL_COMMUNITY)
Admit: 2022-10-30 | Discharge: 2022-10-30 | Disposition: A | Discharge status: Home / Self Care | Payer: Medicare Other | Attending: Internal Medicine | Admitting: Internal Medicine

## 2022-10-30 ENCOUNTER — Observation Stay (HOSPITAL_BASED_OUTPATIENT_CLINIC_OR_DEPARTMENT_OTHER): Payer: Medicare Other

## 2022-10-30 DIAGNOSIS — Z8673 Personal history of transient ischemic attack (TIA), and cerebral infarction without residual deficits: Secondary | ICD-10-CM | POA: Diagnosis not present

## 2022-10-30 DIAGNOSIS — Z87891 Personal history of nicotine dependence: Secondary | ICD-10-CM | POA: Diagnosis not present

## 2022-10-30 DIAGNOSIS — Z888 Allergy status to other drugs, medicaments and biological substances status: Secondary | ICD-10-CM | POA: Diagnosis not present

## 2022-10-30 DIAGNOSIS — I959 Hypotension, unspecified: Secondary | ICD-10-CM | POA: Diagnosis present

## 2022-10-30 DIAGNOSIS — R569 Unspecified convulsions: Secondary | ICD-10-CM | POA: Diagnosis not present

## 2022-10-30 DIAGNOSIS — E119 Type 2 diabetes mellitus without complications: Secondary | ICD-10-CM | POA: Diagnosis present

## 2022-10-30 DIAGNOSIS — Z79899 Other long term (current) drug therapy: Secondary | ICD-10-CM | POA: Diagnosis not present

## 2022-10-30 DIAGNOSIS — Z7984 Long term (current) use of oral hypoglycemic drugs: Secondary | ICD-10-CM | POA: Diagnosis not present

## 2022-10-30 DIAGNOSIS — E860 Dehydration: Secondary | ICD-10-CM | POA: Diagnosis present

## 2022-10-30 DIAGNOSIS — E785 Hyperlipidemia, unspecified: Secondary | ICD-10-CM | POA: Diagnosis present

## 2022-10-30 DIAGNOSIS — I10 Essential (primary) hypertension: Secondary | ICD-10-CM | POA: Diagnosis present

## 2022-10-30 DIAGNOSIS — G40909 Epilepsy, unspecified, not intractable, without status epilepticus: Secondary | ICD-10-CM | POA: Diagnosis present

## 2022-10-30 DIAGNOSIS — R55 Syncope and collapse: Secondary | ICD-10-CM

## 2022-10-30 DIAGNOSIS — F0390 Unspecified dementia without behavioral disturbance: Secondary | ICD-10-CM | POA: Diagnosis present

## 2022-10-30 DIAGNOSIS — Z880 Allergy status to penicillin: Secondary | ICD-10-CM | POA: Diagnosis not present

## 2022-10-30 DIAGNOSIS — N170 Acute kidney failure with tubular necrosis: Secondary | ICD-10-CM | POA: Diagnosis present

## 2022-10-30 DIAGNOSIS — R001 Bradycardia, unspecified: Secondary | ICD-10-CM | POA: Diagnosis present

## 2022-10-30 LAB — CBC
HCT: 41.4 % (ref 39.0–52.0)
Hemoglobin: 13.2 g/dL (ref 13.0–17.0)
MCH: 27.8 pg (ref 26.0–34.0)
MCHC: 31.9 g/dL (ref 30.0–36.0)
MCV: 87.3 fL (ref 80.0–100.0)
Platelets: 201 10*3/uL (ref 150–400)
RBC: 4.74 MIL/uL (ref 4.22–5.81)
RDW: 13.9 % (ref 11.5–15.5)
WBC: 5.9 10*3/uL (ref 4.0–10.5)
nRBC: 0 % (ref 0.0–0.2)

## 2022-10-30 LAB — BASIC METABOLIC PANEL
Anion gap: 13 (ref 5–15)
BUN: 29 mg/dL — ABNORMAL HIGH (ref 8–23)
CO2: 25 mmol/L (ref 22–32)
Calcium: 9.5 mg/dL (ref 8.9–10.3)
Chloride: 103 mmol/L (ref 98–111)
Creatinine, Ser: 1.11 mg/dL (ref 0.61–1.24)
GFR, Estimated: >60 mL/min (ref >60)
Glucose, Bld: 94 mg/dL (ref 70–99)
Potassium: 3.7 mmol/L (ref 3.5–5.1)
Sodium: 141 mmol/L (ref 135–145)

## 2022-10-30 LAB — ECHOCARDIOGRAM COMPLETE
Area-P 1/2: 1.84 cm2
Height: 74.5 in
MV M vel: 2.77 m/s
MV Peak grad: 30.8 mmHg
S' Lateral: 2.5 cm
Weight: 3456.81 [oz_av]

## 2022-10-30 LAB — GLUCOSE, CAPILLARY
Glucose-Capillary: 91 mg/dL (ref 70–99)
Glucose-Capillary: 114 mg/dL — ABNORMAL HIGH (ref 70–99)
Glucose-Capillary: 90 mg/dL (ref 70–99)
Glucose-Capillary: 108 mg/dL — ABNORMAL HIGH (ref 70–99)

## 2022-10-30 MED ORDER — BRIMONIDINE TARTRATE-TIMOLOL 0.2-0.5 % OP SOLN: 1.0000 [drp] | Freq: Two times a day (BID) | OPHTHALMIC | Status: DC

## 2022-10-30 MED ORDER — TIMOLOL MALEATE 0.5 % OP SOLN
1.0000 [drp] | Freq: Two times a day (BID) | OPHTHALMIC | Status: DC
  Filled 2022-10-30: qty 5

## 2022-10-30 MED ORDER — BRIMONIDINE TARTRATE-TIMOLOL 0.2-0.5 % OP SOLN
1.0000 [drp] | Freq: Two times a day (BID) | OPHTHALMIC | Status: DC
  Administered 2022-10-30 – 2022-10-31 (×3): 1 [drp] via OPHTHALMIC

## 2022-10-30 MED ORDER — BRIMONIDINE TARTRATE 0.2 % OP SOLN
1.0000 [drp] | Freq: Two times a day (BID) | OPHTHALMIC | Status: DC
  Filled 2022-10-30: qty 5

## 2022-10-30 NOTE — Plan of Care (Signed)
  Problem: Coping: Goal: Ability to adjust to condition or change in health will improve Outcome: Progressing   Problem: Fluid Volume: Goal: Ability to maintain a balanced intake and output will improve Outcome: Progressing   Problem: Health Behavior/Discharge Planning: Goal: Ability to manage health-related needs will improve Outcome: Progressing   Problem: Education: Goal: Ability to describe self-care measures that may prevent or decrease complications (Diabetes Survival Skills Education) will improve Outcome: Progressing

## 2022-10-30 NOTE — Progress Notes (Signed)
  Echocardiogram 2D Echocardiogram has been performed.  William Mills 10/30/2022, 9:59 AM

## 2022-10-30 NOTE — Procedures (Signed)
Patient Name: William Mills  MRN: [id number]  Epilepsy Attending: Charlsie Quest  Referring Physician/Provider: Maryln Gottron, MD  Date: 10/30/2022 Duration: 25.15 mins  Patient history: 88yo F with syncope getting eeg to evaluate for seizure  Level of alertness: Awake, asleep  AEDs during EEG study: LEV  Technical aspects: This EEG study was done with scalp electrodes positioned according to the 10-20 International system of electrode placement. Electrical activity was reviewed with band pass filter of 1-70Hz , sensitivity of 7 uV/mm, display speed of 57mm/sec with a 60Hz  notched filter applied as appropriate. EEG data were recorded continuously and digitally stored.  Video monitoring was available and reviewed as appropriate.  Description: The posterior dominant rhythm consists of 8-9 Hz activity of moderate voltage (25-35 uV) seen predominantly in posterior head regions, symmetric and reactive to eye opening and eye closing. Sleep was characterized by vertex waves, sleep spindles (12 to 14 Hz), maximal frontocentral region. EEG showed continuous 3 to 6 Hz theta-delta slowing in left temporal region. Hyperventilation and photic stimulation were not performed.     ABNORMALITY - Continuous slow, left temporal region  IMPRESSION: This study is suggestive of cortical dysfunction arising from  left temporal region likely secondary to underlying stroke. No seizures or epileptiform discharges were seen throughout the recording.  [place] Annabelle Harman

## 2022-10-30 NOTE — Progress Notes (Signed)
[facility]                                                                               William Mills, is a 88 y.o. male, DOB - [birth year], EXB:[id number] Admit date - 10/29/2022    Outpatient Primary MD for the patient is Paliwal, Himanshu, MD  LOS - 0  days    Brief summary   87 y.o. male with medical history significant for dementia, hypertension, hyperlipidemia, seizure disorder prior CVA being admitted to the hospital with complaint of syncope.  He moved down here from [place] to live with his daughter and son-in-law about 1 week ago.    Assessment & Plan    Assessment and Plan:   Syncope episode -probably secondary to dehydration / Orthostatic  vs seizures.  - echocardiogram unremarkable.  - continue with IV fluids.  - check orthostatic vital signs.  - no source of infection found.  - CT head without contrast shows Large chronic left temporoparietal infarct  Unable to do the MRI of the brain due to the metal.  - awaiting for EEG.    Type 2 diabetes mellitus:  Non insulin dependent Hemoglobin A1c is pending.  CBG (last 3)  Recent Labs    10/29/22 2208 10/30/22 0755 10/30/22 1154  GLUCAP 83 91 114*   Resume SSI.    Dementia:  Continue with donepezil.         Estimated body mass index is 27.37 kg/m as calculated from the following:   Height as of this encounter: 6' 2.5" (1.892 m).   Weight as of this encounter: 98 kg.  Code Status:  DVT Prophylaxis:  enoxaparin (LOVENOX) injection 40 mg Start: 10/29/22 1800 SCDs Start: 10/29/22 1659   Level of Care: Level of care: Telemetry Family Communication: Updated patient's   Disposition Plan:     Remains inpatient appropriate:  IV fluids.   Procedures:  EEG Echocardiogram.   Consultants:   None.   Antimicrobials:   Anti-infectives (From admission, onward)    None        Medications  Scheduled Meds:  atorvastatin  20 mg Oral Daily   brimonidine-timolol  1 drop  Right Eye BID   donepezil  10 mg Oral Daily   enoxaparin (LOVENOX) injection  40 mg Subcutaneous Q24H   insulin aspart  0-15 Units Subcutaneous TID WC   insulin aspart  0-5 Units Subcutaneous QHS   levETIRAcetam  2,000 mg Oral Daily   Continuous Infusions:  sodium chloride 75 mL/hr at 10/30/22 0412   PRN Meds:.acetaminophen **OR** acetaminophen, albuterol, ondansetron **OR** ondansetron (ZOFRAN) IV, traZODone    Subjective:   William Mills was seen and examined today.  No new complaints.   Objective:   Vitals:   10/30/22 0249 10/30/22 0608 10/30/22 0611 10/30/22 0617  BP: (!) 147/63 (!) 142/66 (!) 152/67 (!) 143/59  Pulse: (!) 54 (!) 57 (!) 57 (!) 52  Resp: 18 18 18    Temp: 98 F (36.7 C) 97.9 F (36.6 C)    TempSrc: Oral Oral    SpO2: 100% 100% 100% 100%  Weight:      Height:        Intake/Output  Summary (Last 24 hours) at 10/30/2022 1244 Last data filed at 10/30/2022 0412 Gross per 24 hour  Intake 506.21 ml  Output 250 ml  Net 256.21 ml   Filed Weights   10/29/22 1341  Weight: 98 kg     Exam General exam: Appears calm and comfortable  Respiratory system: Clear to auscultation. Respiratory effort normal. Cardiovascular system: S1 & S2 heard, RRR. No JVD, murmurs Gastrointestinal system: Abdomen is nondistended, soft and nontender.  Central nervous system: Alert and oriented.  Extremities: Symmetric 5 x 5 power. Skin: No rashes,  Psychiatry: Mood & affect appropriate.     Data Reviewed:  I have personally reviewed following labs and imaging studies   CBC Lab Results  Component Value Date   WBC 5.9 10/30/2022   RBC 4.74 10/30/2022   HGB 13.2 10/30/2022   HCT 41.4 10/30/2022   MCV 87.3 10/30/2022   MCH 27.8 10/30/2022   PLT 201 10/30/2022   MCHC 31.9 10/30/2022   RDW 13.9 10/30/2022   LYMPHSABS 1.7 07/21/2018   MONOABS 0.2 07/21/2018   EOSABS 0.2 07/21/2018   BASOSABS 0.0 07/21/2018     Last metabolic panel Lab Results  Component Value  Date   NA 141 10/30/2022   K 3.7 10/30/2022   CL 103 10/30/2022   CO2 25 10/30/2022   BUN 29 (H) 10/30/2022   CREATININE 1.11 10/30/2022   GLUCOSE 94 10/30/2022   GFRNONAA >60 10/30/2022   CALCIUM 9.5 10/30/2022   PROT 7.4 10/29/2022   ALBUMIN 3.9 10/29/2022   BILITOT 1.2 10/29/2022   ALKPHOS 62 10/29/2022   AST 15 10/29/2022   ALT 15 10/29/2022   ANIONGAP 13 10/30/2022    CBG (last 3)  Recent Labs    10/29/22 2208 10/30/22 0755 10/30/22 1154  GLUCAP 83 91 114*      Coagulation Profile: No results for input(s): "INR", "PROTIME" in the last 168 hours.   Radiology Studies: ECHOCARDIOGRAM COMPLETE  Result Date: 10/30/2022    ECHOCARDIOGRAM REPORT   Patient Name:   William Mills Date of Exam: 10/30/2022 Medical Rec #:  [id number]        Height:       74.5 in Accession #:    [id number]       Weight:       216.0 lb Date of Birth:  1935-01-07         BSA:          2.256 m Patient Age:    88 years         BP:           143/59 mmHg Patient Gender: M                HR:           63 bpm. Exam Location:  Inpatient Procedure: 2D Echo, Cardiac Doppler and Color Doppler Indications:    Syncope R55  History:        Patient has no prior history of Echocardiogram examinations.                 Signs/Symptoms:Syncope and Alzheimer's; Risk                 Factors:Dyslipidemia, Former Smoker and Hypertension.  Sonographer:    Aron Baba Referring Phys: [id number] [facility]  Sonographer Comments: No subcostal window and suboptimal parasternal window. IMPRESSIONS  1. Left ventricular ejection fraction, by estimation, is 60 to 65%. The left ventricle has normal function. The  left ventricle has no regional wall motion abnormalities. Left ventricular diastolic parameters are consistent with Grade I diastolic dysfunction (impaired relaxation).  2. Right ventricular systolic function is normal. The right ventricular size is normal. Tricuspid regurgitation signal is inadequate for assessing PA pressure.   3. The mitral valve is normal in structure. No evidence of mitral valve regurgitation.  4. The aortic valve is tricuspid. Aortic valve regurgitation is not visualized.  5. There is mild dilatation of the aortic root, measuring 40 mm.  6. The inferior vena cava is normal in size with greater than 50% respiratory variability, suggesting right atrial pressure of 3 mmHg. FINDINGS  Left Ventricle: Left ventricular ejection fraction, by estimation, is 60 to 65%. The left ventricle has normal function. The left ventricle has no regional wall motion abnormalities. The left ventricular internal cavity size was normal in size. There is  no left ventricular hypertrophy. Left ventricular diastolic parameters are consistent with Grade I diastolic dysfunction (impaired relaxation). Right Ventricle: The right ventricular size is normal. Right ventricular systolic function is normal. Tricuspid regurgitation signal is inadequate for assessing PA pressure. Left Atrium: Left atrial size was normal in size. Right Atrium: Right atrial size was normal in size. Pericardium: There is no evidence of pericardial effusion. Mitral Valve: The mitral valve is normal in structure. No evidence of mitral valve regurgitation. Tricuspid Valve: Tricuspid valve regurgitation is not demonstrated. Aortic Valve: The aortic valve is tricuspid. Aortic valve regurgitation is not visualized. Pulmonic Valve: Pulmonic valve regurgitation is not visualized. Aorta: There is mild dilatation of the aortic root, measuring 40 mm. Venous: The inferior vena cava is normal in size with greater than 50% respiratory variability, suggesting right atrial pressure of 3 mmHg. IAS/Shunts: The interatrial septum was not well visualized.  LEFT VENTRICLE PLAX 2D LVIDd:         3.80 cm   Diastology LVIDs:         2.50 cm   LV e' medial:    6.31 cm/s LV PW:         1.00 cm   LV E/e' medial:  9.0 LV IVS:        0.90 cm   LV e' lateral:   8.16 cm/s LVOT diam:     2.20 cm   LV E/e'  lateral: 6.9 LV SV:         87 LV SV Index:   38 LVOT Area:     3.80 cm  RIGHT VENTRICLE RV S prime:     12.20 cm/s TAPSE (M-mode): 1.2 cm LEFT ATRIUM           Index        RIGHT ATRIUM          Index LA diam:      3.90 cm 1.73 cm/m   RA Area:     9.24 cm LA Vol (A2C): 39.4 ml 17.46 ml/m  RA Volume:   13.00 ml 5.76 ml/m LA Vol (A4C): 54.7 ml 24.24 ml/m  AORTIC VALVE             PULMONIC VALVE LVOT Vmax:   101.00 cm/s PR End Diast Vel: 1.63 msec LVOT Vmean:  65.400 cm/s LVOT VTI:    0.228 m  AORTA Ao Root diam: 4.00 cm Ao Asc diam:  3.70 cm MITRAL VALVE MV Area (PHT): 1.84 cm     SHUNTS MV Decel Time: 412 msec     Systemic VTI:  0.23 m MR Peak grad: 30.8 mmHg  Systemic Diam: 2.20 cm MR Vmax:      277.40 cm/s MV E velocity: 56.60 cm/s MV A velocity: 107.00 cm/s MV E/A ratio:  0.53 Photographer signed by Carolan Clines Signature Date/Time: 10/30/2022/10:42:06 AM    Final    CT Head Wo Contrast  Result Date: 10/29/2022 CLINICAL DATA:  Mental status change pre syncopal EXAM: CT HEAD WITHOUT CONTRAST TECHNIQUE: Contiguous axial images were obtained from the base of the skull through the vertex without intravenous contrast. RADIATION DOSE REDUCTION: This exam was performed according to the departmental dose-optimization program which includes automated exposure control, adjustment of the mA and/or kV according to patient size and/or use of iterative reconstruction technique. COMPARISON:  CT brain 09/03/2013 FINDINGS: Brain: No acute territorial infarction, hemorrhage or intracranial mass. Large chronic left temporoparietal infarct with encephalomalacia. Atrophy. Patchy white matter hypodensity consistent with chronic small vessel ischemic change. Right posterior shunt catheter with tip terminating in the body of the right lateral ventricle. Slight interval increase in size of the ventricles compared to prior CT, suspect related to atrophy progression Vascular: No hyperdense vessels. Carotid  vascular calcification and metallic coils or clips at the left carotid bifurcation. Skull: No fracture.  Chronic left craniotomy changes. Sinuses/Orbits: No acute finding. Other: None IMPRESSION: 1. No CT evidence for acute intracranial abnormality. 2. Atrophy and chronic small vessel ischemic changes of the white matter. 3. Large chronic left temporoparietal infarct as before. 4. Right posterior shunt catheter with tip terminating in the body of the right lateral ventricle. Slight interval increase in size of the ventricles compared to prior CT, suspect related to atrophy progression. Electronically Signed   By: Jasmine Pang M.D.   On: 10/29/2022 16:02   DG Chest Port 1 View  Result Date: 10/29/2022 CLINICAL DATA:  Presyncope and weakness with bradycardia EXAM: PORTABLE CHEST 1 VIEW COMPARISON:  None Available. FINDINGS: Partially imaged ventriculoperitoneal shunt catheter courses over the right neck and chest. Normal lung volumes. No focal consolidations. No pleural effusion or pneumothorax. The heart size and mediastinal contours are within normal limits. No acute osseous abnormality. IMPRESSION: No active disease. Electronically Signed   By: Agustin Cree M.D.   On: 10/29/2022 15:42       Kathlen Mody M.D. [facility] 10/30/2022, 12:44 PM  Available via Epic secure chat 7am-7pm After 7 pm, please refer to night coverage provider listed on amion.

## 2022-10-30 NOTE — Evaluation (Addendum)
Physical Therapy Evaluation Patient Details Name: William Mills MRN: [id number] DOB: 1934/09/29 Today's Date: 10/30/2022  History of Present Illness  patient is a 88 yo male presents to therapy following hospital admission on 10/29/2022 due to LOC with slight myoclonic movements lasting 3-5 mins and 10 mins to recover consciousness. EMS checked BP with significant drop from sitting to standing. Pt PMH includes but is not limited to: CVA, dementia, head injury, HLD, HTN, and seizure disorder.  Clinical Impression     Pt admitted with above diagnosis.  Pt currently with functional limitations due to the deficits listed below (see PT Problem List). Pt in bed when therapist arrived. Daughter present and able to provide insight to PLOF. Pt recently transitioned to living with daughter and son in law from [place]. Daughter able to provide 24/7 supervision and assist at time of d/c and pt will benefit from continued therapy services in home setting. Pt required increased time, multimodal cues and CGA for supine to sit, min A for sit to stand  from EOB and commode, gait tasks in room 15 feet x 2 with RW, min A for balance and RW management with noted deficits with obstacle navigation and safety with consistent use of RW with approach to sitting surfaces, pt amb with shuffling pattern and narrow BOS with trunk flexion and decreased cadence. Pt left seated in recliner, all needs in place and family present. Pt will benefit from acute skilled PT to increase their independence and safety with mobility to allow discharge.   Bp supine 143/58 ( 54 PR) Bp seated EOB 180/67 (71 PR) Bp standing 140/73 (89 PR) no reports of dizziness or light headedness       If plan is discharge home, recommend the following: A little help with walking and/or transfers;A little help with bathing/dressing/bathroom;Assistance with cooking/housework;Direct supervision/assist for financial management;Direct supervision/assist for medications  management;Assist for transportation;Help with stairs or ramp for entrance;Supervision due to cognitive status   Can travel by private vehicle        Equipment Recommendations None recommended by PT  Recommendations for Other Services       Functional Status Assessment Patient has had a recent decline in their functional status and demonstrates the ability to make significant improvements in function in a reasonable and predictable amount of time.     Precautions / Restrictions Precautions Precautions: Fall Restrictions Weight Bearing Restrictions: No      Mobility  Bed Mobility Overal bed mobility: Needs Assistance Bed Mobility: Supine to Sit     Supine to sit: Contact guard     General bed mobility comments: with increased time and to scoot to EOB with minor LOB with patient able to regain standing balance with CGA    Transfers Overall transfer level: Needs assistance Equipment used: Rolling walker (2 wheels) Transfers: Sit to/from Stand Sit to Stand: Min assist, From elevated surface           General transfer comment: cues for safety, technique and proper UE placement,  deficits with power up with slight posterior lean and obtaining extension posture. substantial cues for use of RW for transfer to recliner    Ambulation/Gait Ambulation/Gait assistance: Min assist Gait Distance (Feet): 15 Feet Assistive device: Rolling walker (2 wheels) Gait Pattern/deviations: Step-to pattern, Shuffle, Narrow base of support, Trunk flexed Gait velocity: decresed     General Gait Details: daughter indicates pt shuffling pattern with narrow BOS PLOF, with cues pt is unable to modify gait pattern, pt demonstrated diffuclty with obstacle  navigation and approach to sitting surfaces including commode and recliner  Stairs            Wheelchair Mobility     Tilt Bed    Modified Rankin (Stroke Patients Only)       Balance Overall balance assessment: Mild deficits  observed, not formally tested (daughter did not report falls)                                           Pertinent Vitals/Pain Pain Assessment Pain Assessment: No/denies pain    Home Living Family/patient expects to be discharged to:: Private residence Living Arrangements: Children Available Help at Discharge: Family;Available 24 hours/day Type of Home: House Home Access: Stairs to enter   [employer] of Steps: 2   Home Layout: One level Home Equipment: Agricultural consultant (2 wheels) Additional Comments: patients daughter reported looking into getting tub transfer bench for home    Prior Function Prior Level of Function : Needs assist       Physical Assist : ADLs (physical)   ADLs (physical): Bathing;Dressing;Toileting   ADLs Comments: patient wears adult absorbent undergarments at home. patient has A from daughter from bathing and dressing tasks. patient's daughter reported she helps with transfers as needed at home. patients daughter reported looking for caregiver for patient at home as well.     Extremity/Trunk Assessment   Upper Extremity Assessment Upper Extremity Assessment: Overall WFL for tasks assessed    Lower Extremity Assessment Lower Extremity Assessment: Generalized weakness (pt denies B LE sensation abnormaltities)       Communication   Communication Communication: Difficulty following commands/understanding (increased time) Following commands: Follows one step commands with increased time Cueing Techniques: Verbal cues;Gestural cues;Tactile cues;Visual cues  Cognition Arousal: Alert Behavior During Therapy: Flat affect Overall Cognitive Status: History of cognitive impairments - at baseline                                 General Comments: patient was slow to respond to questions with h/o CVA. daughter present in room able to provide PLOF.        General Comments      Exercises     Assessment/Plan     PT Assessment Patient needs continued PT services  PT Problem List Decreased strength;Decreased activity tolerance;Decreased balance;Decreased mobility;Decreased coordination;Decreased cognition;Decreased knowledge of use of DME;Decreased safety awareness       PT Treatment Interventions DME instruction;Gait training;Stair training;Functional mobility training;Therapeutic activities;Therapeutic exercise;Balance training;Neuromuscular re-education;Patient/family education    PT Goals (Current goals can be found in the Care Plan section)  Acute Rehab PT Goals PT Goal Formulation: Patient unable to participate in goal setting    Frequency Min 1X/week     Co-evaluation               AM-PAC PT "6 Clicks" Mobility  Outcome Measure Help needed turning from your back to your side while in a flat bed without using bedrails?: A Little Help needed moving from lying on your back to sitting on the side of a flat bed without using bedrails?: A Little Help needed moving to and from a bed to a chair (including a wheelchair)?: A Little Help needed standing up from a chair using your arms (e.g., wheelchair or bedside chair)?: A Little Help needed to walk in hospital room?:  A Little Help needed climbing 3-5 steps with a railing? : A Lot 6 Click Score: 17    End of Session Equipment Utilized During Treatment: Gait belt Activity Tolerance: Patient limited by fatigue Patient left: in chair;with call bell/phone within reach;with family/visitor present Nurse Communication: Mobility status PT Visit Diagnosis: Unsteadiness on feet (R26.81);Other abnormalities of gait and mobility (R26.89);Difficulty in walking, not elsewhere classified (R26.2)    Time: 1610-9604 PT Time Calculation (min) (ACUTE ONLY): 28 min   Charges:   PT Evaluation $PT Eval Low Complexity: 1 Low   PT General Charges $$ ACUTE PT VISIT: 1 Visit         Johnny Bridge, PT Acute Rehab   Jacqualyn Posey 10/30/2022, 2:54  PM

## 2022-10-30 NOTE — Progress Notes (Signed)
EEG complete - results pending 

## 2022-10-30 NOTE — Evaluation (Signed)
Occupational Therapy Evaluation Patient Details Name: William Mills MRN: [id number] DOB: 14-Nov-1934 Today's Date: 10/30/2022   History of Present Illness patient is a 88 yo male presents to therapy following hospital admission on 10/29/2022 due to LOC with slight myoclonic movements lasting 3-5 mins and 10 mins to recover consciousness. EMS checked BP with significant drop from sitting to standing. Pt PMH includes but is not limited to: CVA, dementia, head injury, HLD, HTN, and seizure disorder.   Clinical Impression   Patient is a 88 year old male who was admitted for above. Patient was living at home with 24/7 caregiver support from daughter and son in law. Patient was min A for transfers with consistent cues and physical A to keep walker close and maintain posture. Patient's daughter was present during session. Patient was noted to have decreased functional activity tolerance, decreased endurance, decreased standing balance, decreased safety awareness, and decreased knowledge of AD/AE impacting participation in ADLs. Patient plans to d/c home with daughter and [facility] services. Patient would continue to benefit from skilled OT services at this time while admitted and after d/c to address noted deficits in order to improve overall safety and independence in ADLs.        If plan is discharge home, recommend the following: A little help with walking and/or transfers;A little help with bathing/dressing/bathroom;Assistance with cooking/housework;Direct supervision/assist for medications management;Assist for transportation;Help with stairs or ramp for entrance;Direct supervision/assist for financial management    Functional Status Assessment  Patient has had a recent decline in their functional status and demonstrates the ability to make significant improvements in function in a reasonable and predictable amount of time.  Equipment Recommendations  Tub/shower bench       Precautions / Restrictions  Precautions Precautions: Fall Restrictions Weight Bearing Restrictions: No      Mobility Bed Mobility Overal bed mobility: Needs Assistance Bed Mobility: Supine to Sit     Supine to sit: Contact guard     General bed mobility comments: with increased time and to scoot to EOB with minor LOB with patient able to regain standing balance with CGA          Balance Overall balance assessment: Mild deficits observed, not formally tested             ADL either performed or assessed with clinical judgement   ADL Overall ADL's : Needs assistance/impaired     Grooming: Sitting;Minimal assistance   Upper Body Bathing: Sitting;Moderate assistance   Lower Body Bathing: Sitting/lateral leans;Total assistance   Upper Body Dressing : Sitting;Moderate assistance   Lower Body Dressing: Sitting/lateral leans;Maximal assistance   Toilet Transfer: Minimal assistance;+2 for physical assistance;+2 for safety/equipment;Ambulation;Rolling walker (2 wheels) Toilet Transfer Details (indicate cue type and reason): to get in and out of bathroom with physical A for walker placement. patient was able to stand with MIN A from low surface in room with +2 for safety and line management. daughter reported walking with shuffling steps was similar to home. Toileting- Clothing Manipulation and Hygiene: Maximal assistance;Sit to/from stand               Vision Baseline Vision/History: 1 Wears glasses Additional Comments: patient was noted to have discoloration of L eye and turning head to look at things in room with R side.            Pertinent Vitals/Pain Pain Assessment Pain Assessment: No/denies pain     Extremity/Trunk Assessment Upper Extremity Assessment Upper Extremity Assessment: Overall WFL for tasks assessed  Lower Extremity Assessment Lower Extremity Assessment: Defer to PT evaluation       Communication     Cognition Arousal: Alert Behavior During Therapy: Flat  affect Overall Cognitive Status: History of cognitive impairments - at baseline         General Comments: patient was slow to respond to questions with h/o CVA. daughter present in room able to provide PLOF.     General Comments               Home Living Family/patient expects to be discharged to:: Private residence Living Arrangements: Children Available Help at Discharge: Family;Available 24 hours/day Type of Home: House Home Access: Stairs to enter [employer] of Steps: 2   Home Layout: One level     Bathroom Shower/Tub: Walk-in shower         Home Equipment: Agricultural consultant (2 wheels)   Additional Comments: patients daughter reported looking into getting tub transfer bench for home      Prior Functioning/Environment Prior Level of Function : Needs assist       Physical Assist : ADLs (physical)   ADLs (physical): Bathing;Dressing;Toileting   ADLs Comments: patient wears adult absorbent undergarments at home. patient has A from daughter from bathing and dressing tasks. patient's daughter reported she helps with transfers as needed at home. patients daughter reported looking for caregiver for patient at home as well.        OT Problem List: Decreased activity tolerance;Impaired balance (sitting and/or standing);Decreased coordination;Decreased safety awareness;Decreased knowledge of precautions;Decreased knowledge of use of DME or AE      OT Treatment/Interventions: Self-care/ADL training;Energy conservation;Therapeutic exercise;DME and/or AE instruction;Patient/family education;Therapeutic activities;Balance training    OT Goals(Current goals can be found in the care plan section) Acute Rehab OT Goals Patient Stated Goal: none stated OT Goal Formulation: With family Time For Goal Achievement: 11/13/22 Potential to Achieve Goals: Fair  OT Frequency: Min 1X/week    Co-evaluation              AM-PAC OT "6 Clicks" Daily Activity      Outcome Measure Help from another person eating meals?: A Little Help from another person taking care of personal grooming?: A Little Help from another person toileting, which includes using toliet, bedpan, or urinal?: A Lot Help from another person bathing (including washing, rinsing, drying)?: A Lot Help from another person to put on and taking off regular upper body clothing?: A Little Help from another person to put on and taking off regular lower body clothing?: A Lot 6 Click Score: 15   End of Session Equipment Utilized During Treatment: Gait belt;Rolling walker (2 wheels) Nurse Communication: Other (comment) (ok to participate in session)  Activity Tolerance: Patient tolerated treatment well Patient left: in chair;with call bell/phone within reach;with family/visitor present  OT Visit Diagnosis: Unsteadiness on feet (R26.81);Other abnormalities of gait and mobility (R26.89);History of falling (Z91.81)                Time:  -1036   Charges:  OT General Charges $OT Visit: 1 Visit OT Evaluation $OT Eval Moderate Complexity: 1 Mod  William Mills OTR/L, MS Acute Rehabilitation Department Office# [phone]   William Mills 10/30/2022, 2:19 PM

## 2022-10-31 DIAGNOSIS — R55 Syncope and collapse: Secondary | ICD-10-CM | POA: Diagnosis not present

## 2022-10-31 LAB — GLUCOSE, CAPILLARY
Glucose-Capillary: 83 mg/dL (ref 70–99)
Glucose-Capillary: 120 mg/dL — ABNORMAL HIGH (ref 70–99)

## 2022-10-31 LAB — HEMOGLOBIN A1C
Hgb A1c MFr Bld: 6 % — ABNORMAL HIGH (ref 4.8–5.6)
Mean Plasma Glucose: 126 mg/dL

## 2022-10-31 NOTE — Progress Notes (Signed)
   10/31/22 1459  TOC Brief Assessment  Insurance and Status Reviewed  Patient has primary care physician Yes  Home environment has been reviewed Yes (home with daughter)  Prior level of function: Independent with assist as needed  Prior/Current Home Services No current home services  Social Determinants of Health Reivew SDOH reviewed no interventions necessary  Readmission risk has been reviewed Yes  Transition of care needs transition of care needs identified, TOC will continue to follow

## 2022-10-31 NOTE — TOC Transition Note (Signed)
Transition of Care [facility]) - CM/SW Discharge Note   Patient Details  Name: William Mills MRN: [id number] Date of Birth: 02-May-1934  [facility] CM/SW Contact:  Beckie Busing, RN Phone Number:[phone]  10/31/2022, 3:01 PM   Clinical Narrative:    [facility] consulted for patient with discharge orders and recommendations for [facility]. CM at bedside with patient and daughter. Patient offered choice for [facility]. Home health referral has been accepted by [facility] with [facility]. AVS has been updated. No other TOC needs noted at this tim. TOC will sign off.       Barriers to Discharge: No Barriers Identified   Patient Goals and CMS Choice      Discharge Placement                         Discharge Plan and Services Additional resources added to the After Visit Summary for                                       Social Determinants of Health (SDOH) Interventions SDOH Screenings   Food Insecurity: No Food Insecurity (10/29/2022)  Housing: Low Risk  (10/29/2022)  Transportation Needs: No Transportation Needs (10/29/2022)  Utilities: Not At Risk (10/29/2022)  Tobacco Use: Medium Risk (10/29/2022)     Readmission Risk Interventions    10/31/2022    2:58 PM  Readmission Risk Prevention Plan  Post Dischage Appt Complete  Medication Screening Complete  Transportation Screening Complete

## 2022-10-31 NOTE — Discharge Summary (Signed)
Physician Discharge Summary   Patient: William Mills MRN: [id number] DOB: Sep 02, 1934  Admit date:     10/29/2022  Discharge date: 10/31/22  Discharge Physician: Tyrone Nine   PCP: Sharmon Revere, MD   Recommendations at discharge:  Follow up with PCP in 1-2 weeks  Discharge Diagnoses: Principal Problem:   Syncope  Hospital Course: William Mills is an 88 y.o. male with medical history significant for dementia, hypertension, hyperlipidemia, seizure disorder s/p prior CVA, recently moved in with daughter from [place], who presented with an episode of loss of consciousness with upper extremity jerking movements and poor responsiveness after regaining consciousness. He was admitted for syncope work up (vs. seizure), and his AED keppra was continued. Cardiac monitoring, echocardiogram, and CT head were unrevealing. There is a large left temporoparietal chronic infarct and atrophy on CT and cortical dysfunction evident on EEG 9/11 consistent with that distribution. No seizure activity was noted clinically during his stay nor by EEG. He will continue with plans for PCP follow up, consideration of neurology evaluation if symptoms return.   Assessment and Plan: Syncope vs. seizure: In pt with seizure disorder and structural nidus (hx large cortical CVA), may have been breakthrough seizure, though no cardiac abnormality noted here (occasional mild sinus bradycardia into 50's with hypERtension) and no seizure activity is ongoing after restarting keppra.  - Continue keppra. He's at ceiling dose, would consider additional AED if has additional episodes. He carries listed allergies to carbamazepine and phenytoin.  - Continue seizure precautions, discussed with his daughter who helps him with all ADLs.    Hx CVA:  - Continue ASA 81mg   HTN:  - With his borderline bradycardia, would avoid coreg (not been taking any way). Continue, norvasc, ARB-HCTZ, monitor BMP and BP at PCP follow up. No  longer needs lasix since improvement in diet, and on thiazide with CrCl near 50ml/min anyway.   Type 2 diabetes mellitus: HbA1c 6%.  - Continue metformin since renal function is ok  Dementia:  - Continue donepezil 10mg  (would need to consider discontinuing if bradycardia is felt to be symptomatic; currently not felt to be symptomatic)  Consultants: Neurology for EEG evaluation only Procedures performed: EEG, echo  Disposition: Home with home health PT and OT. Diet recommendation:  Carb modified diet DISCHARGE MEDICATION: Allergies as of 10/31/2022       Reactions   Other Other (See Comments)   Can have NO MRI(s) because of a metal plate in his head   Nifedipine Other (See Comments)   Edema   Carbamazepine Other (See Comments)   Reaction unknown   Penicillins Other (See Comments)   Reaction unknown   Phenytoin Other (See Comments)   Reaction unknown        Medication List     STOP taking these medications    carvedilol 3.125 MG tablet Commonly known as: COREG   furosemide 20 MG tablet Commonly known as: LASIX       TAKE these medications    amLODipine 10 MG tablet Commonly known as: NORVASC Take 10 mg by mouth in the morning.   atorvastatin 20 MG tablet Commonly known as: LIPITOR take 1 tablet by mouth once daily What changed: when to take this   Bayer Low Dose 81 MG tablet Generic drug: aspirin EC Take 81 mg by mouth in the morning. Swallow whole.   bimatoprost 0.01 % Soln Commonly known as: Lumigan INSTILL ONE DROP INTO LEFT EYE EVERY DAY AT BEDTIME   brimonidine-timolol 0.2-0.5 % ophthalmic solution  Commonly known as: Combigan Place 1 drop into the right eye 2 (two) times daily.   donepezil 10 MG tablet Commonly known as: ARICEPT Take 10 mg by mouth in the morning.   donepezil 5 MG tablet Commonly known as: ARICEPT take 1 tablet by mouth once daily   irbesartan-hydrochlorothiazide 150-12.5 MG tablet Commonly known as: AVALIDE Take 1  tablet by mouth daily.   levETIRAcetam 500 MG 24 hr tablet Commonly known as: KEPPRA XR Take 2,000 mg by mouth in the morning. What changed: Another medication with the same name was removed. Continue taking this medication, and follow the directions you see here.   metFORMIN 500 MG tablet Commonly known as: GLUCOPHAGE Take 500 mg by mouth daily with breakfast.   potassium chloride SA 20 MEQ tablet Commonly known as: KLOR-CON M Take 40 mEq by mouth in the morning.        Follow-up Information     Paliwal, Himanshu, MD Follow up.   Specialty: Family Medicine Contact information: [address] [place] [address] [phone]                Discharge Exam: [name] Weights   10/29/22 1341  Weight: 98 kg  BP (!) 142/65 (BP Location: Left Arm)   Pulse (!) 54   Temp 97.6 F (36.4 C) (Oral)   Resp 17   Ht 6' 2.5" (1.892 m)   Wt 98 kg   SpO2 100%   BMI 27.37 kg/m   Elderly pleasantly confused (limited verbal output) male in no distress Regular borderline bradycardia, no edema, no JVD Clear, nonlabored  Condition at discharge: stable  The results of significant diagnostics from this hospitalization (including imaging, microbiology, ancillary and laboratory) are listed below for reference.   Imaging Studies: EEG adult  Result Date: Nov 28, 2022 Charlsie Quest, MD     2022/11/28  4:51 PM Patient Name: William Mills MRN: [id number] Epilepsy Attending: Charlsie Quest Referring Physician/Provider: Maryln Gottron, MD Date: Nov 28, 2022 Duration: 25.15 mins Patient history: 88yo F with syncope getting eeg to evaluate for seizure Level of alertness: Awake, asleep AEDs during EEG study: LEV Technical aspects: This EEG study was done with scalp electrodes positioned according to the 10-20 International system of electrode placement. Electrical activity was reviewed with band pass filter of 1-70Hz , sensitivity of 7 uV/mm, display speed of 70mm/sec with a 60Hz  notched  filter applied as appropriate. EEG data were recorded continuously and digitally stored.  Video monitoring was available and reviewed as appropriate. Description: The posterior dominant rhythm consists of 8-9 Hz activity of moderate voltage (25-35 uV) seen predominantly in posterior head regions, symmetric and reactive to eye opening and eye closing. Sleep was characterized by vertex waves, sleep spindles (12 to 14 Hz), maximal frontocentral region. EEG showed continuous 3 to 6 Hz theta-delta slowing in left temporal region. Hyperventilation and photic stimulation were not performed.   ABNORMALITY - Continuous slow, left temporal region IMPRESSION: This study is suggestive of cortical dysfunction arising from  left temporal region likely secondary to underlying stroke. No seizures or epileptiform discharges were seen throughout the recording. Charlsie Quest   ECHOCARDIOGRAM COMPLETE  Result Date: 11-28-2022    ECHOCARDIOGRAM REPORT   Patient Name:   CORNELIA TIPPER Date of Exam: 11/28/2022 Medical Rec #:  [id number]        Height:       74.5 in Accession #:    [id number]       Weight:  216.0 lb Date of Birth:  12-02-1934         BSA:          2.256 m Patient Age:    88 years         BP:           143/59 mmHg Patient Gender: M                HR:           63 bpm. Exam Location:  Inpatient Procedure: 2D Echo, Cardiac Doppler and Color Doppler Indications:    Syncope R55  History:        Patient has no prior history of Echocardiogram examinations.                 Signs/Symptoms:Syncope and Alzheimer's; Risk                 Factors:Dyslipidemia, Former Smoker and Hypertension.  Sonographer:    Aron Baba Referring Phys: [id number] MIR M [facility]  Sonographer Comments: No subcostal window and suboptimal parasternal window. IMPRESSIONS  1. Left ventricular ejection fraction, by estimation, is 60 to 65%. The left ventricle has normal function. The left ventricle has no regional wall motion abnormalities. Left  ventricular diastolic parameters are consistent with Grade I diastolic dysfunction (impaired relaxation).  2. Right ventricular systolic function is normal. The right ventricular size is normal. Tricuspid regurgitation signal is inadequate for assessing PA pressure.  3. The mitral valve is normal in structure. No evidence of mitral valve regurgitation.  4. The aortic valve is tricuspid. Aortic valve regurgitation is not visualized.  5. There is mild dilatation of the aortic root, measuring 40 mm.  6. The inferior vena cava is normal in size with greater than 50% respiratory variability, suggesting right atrial pressure of 3 mmHg. FINDINGS  Left Ventricle: Left ventricular ejection fraction, by estimation, is 60 to 65%. The left ventricle has normal function. The left ventricle has no regional wall motion abnormalities. The left ventricular internal cavity size was normal in size. There is  no left ventricular hypertrophy. Left ventricular diastolic parameters are consistent with Grade I diastolic dysfunction (impaired relaxation). Right Ventricle: The right ventricular size is normal. Right ventricular systolic function is normal. Tricuspid regurgitation signal is inadequate for assessing PA pressure. Left Atrium: Left atrial size was normal in size. Right Atrium: Right atrial size was normal in size. Pericardium: There is no evidence of pericardial effusion. Mitral Valve: The mitral valve is normal in structure. No evidence of mitral valve regurgitation. Tricuspid Valve: Tricuspid valve regurgitation is not demonstrated. Aortic Valve: The aortic valve is tricuspid. Aortic valve regurgitation is not visualized. Pulmonic Valve: Pulmonic valve regurgitation is not visualized. Aorta: There is mild dilatation of the aortic root, measuring 40 mm. Venous: The inferior vena cava is normal in size with greater than 50% respiratory variability, suggesting right atrial pressure of 3 mmHg. IAS/Shunts: The interatrial septum  was not well visualized.  LEFT VENTRICLE PLAX 2D LVIDd:         3.80 cm   Diastology LVIDs:         2.50 cm   LV e' medial:    6.31 cm/s LV PW:         1.00 cm   LV E/e' medial:  9.0 LV IVS:        0.90 cm   LV e' lateral:   8.16 cm/s LVOT diam:     2.20 cm   LV E/e'  lateral: 6.9 LV SV:         87 LV SV Index:   38 LVOT Area:     3.80 cm  RIGHT VENTRICLE RV S prime:     12.20 cm/s TAPSE (M-mode): 1.2 cm LEFT ATRIUM           Index        RIGHT ATRIUM          Index LA diam:      3.90 cm 1.73 cm/m   RA Area:     9.24 cm LA Vol (A2C): 39.4 ml 17.46 ml/m  RA Volume:   13.00 ml 5.76 ml/m LA Vol (A4C): 54.7 ml 24.24 ml/m  AORTIC VALVE             PULMONIC VALVE LVOT Vmax:   101.00 cm/s PR End Diast Vel: 1.63 msec LVOT Vmean:  65.400 cm/s LVOT VTI:    0.228 m  AORTA Ao Root diam: 4.00 cm Ao Asc diam:  3.70 cm MITRAL VALVE MV Area (PHT): 1.84 cm     SHUNTS MV Decel Time: 412 msec     Systemic VTI:  0.23 m MR Peak grad: 30.8 mmHg     Systemic Diam: 2.20 cm MR Vmax:      277.40 cm/s MV E velocity: 56.60 cm/s MV A velocity: 107.00 cm/s MV E/A ratio:  0.53 Photographer signed by Carolan Clines Signature Date/Time: 10/30/2022/10:42:06 AM    Final    CT Head Wo Contrast  Result Date: 10/29/2022 CLINICAL DATA:  Mental status change pre syncopal EXAM: CT HEAD WITHOUT CONTRAST TECHNIQUE: Contiguous axial images were obtained from the base of the skull through the vertex without intravenous contrast. RADIATION DOSE REDUCTION: This exam was performed according to the departmental dose-optimization program which includes automated exposure control, adjustment of the mA and/or kV according to patient size and/or use of iterative reconstruction technique. COMPARISON:  CT brain 09/03/2013 FINDINGS: Brain: No acute territorial infarction, hemorrhage or intracranial mass. Large chronic left temporoparietal infarct with encephalomalacia. Atrophy. Patchy white matter hypodensity consistent with chronic small vessel  ischemic change. Right posterior shunt catheter with tip terminating in the body of the right lateral ventricle. Slight interval increase in size of the ventricles compared to prior CT, suspect related to atrophy progression Vascular: No hyperdense vessels. Carotid vascular calcification and metallic coils or clips at the left carotid bifurcation. Skull: No fracture.  Chronic left craniotomy changes. Sinuses/Orbits: No acute finding. Other: None IMPRESSION: 1. No CT evidence for acute intracranial abnormality. 2. Atrophy and chronic small vessel ischemic changes of the white matter. 3. Large chronic left temporoparietal infarct as before. 4. Right posterior shunt catheter with tip terminating in the body of the right lateral ventricle. Slight interval increase in size of the ventricles compared to prior CT, suspect related to atrophy progression. Electronically Signed   By: Jasmine Pang M.D.   On: 10/29/2022 16:02   DG Chest Port 1 View  Result Date: 10/29/2022 CLINICAL DATA:  Presyncope and weakness with bradycardia EXAM: PORTABLE CHEST 1 VIEW COMPARISON:  None Available. FINDINGS: Partially imaged ventriculoperitoneal shunt catheter courses over the right neck and chest. Normal lung volumes. No focal consolidations. No pleural effusion or pneumothorax. The heart size and mediastinal contours are within normal limits. No acute osseous abnormality. IMPRESSION: No active disease. Electronically Signed   By: Agustin Cree M.D.   On: 10/29/2022 15:42    Microbiology: No results found for this or any previous visit.  Labs:  CBC: Recent Labs  Lab 10/29/22 1448 10/30/22 0606  WBC 6.2 5.9  HGB 13.3 13.2  HCT 41.5 41.4  MCV 87.2 87.3  PLT 226 201   Basic Metabolic Panel: Recent Labs  Lab 10/29/22 1448 10/30/22 0606  NA 142 141  K 3.9 3.7  CL 104 103  CO2 26 25  GLUCOSE 109* 94  BUN 33* 29*  CREATININE 1.37* 1.11  CALCIUM 9.9 9.5   Liver Function Tests: Recent Labs  Lab 10/29/22 1448  AST  15  ALT 15  ALKPHOS 62  BILITOT 1.2  PROT 7.4  ALBUMIN 3.9   CBG: Recent Labs  Lab 10/30/22 1154 10/30/22 1701 10/30/22 2156 10/31/22 0807 10/31/22 1154  GLUCAP 114* 90 108* 83 120*    Discharge time spent: greater than 30 minutes.  Signed: Tyrone Nine, MD [facility] 10/31/2022

## 2022-11-11 ENCOUNTER — Emergency Department (HOSPITAL_COMMUNITY)
Admission: EM | Admit: 2022-11-11 | Discharge: 2022-11-11 | Disposition: A | Discharge status: Home / Self Care | Payer: Medicare Other | Attending: Emergency Medicine | Admitting: Emergency Medicine

## 2022-11-11 ENCOUNTER — Other Ambulatory Visit: Payer: Self-pay

## 2022-11-11 ENCOUNTER — Encounter (HOSPITAL_COMMUNITY): Payer: Self-pay

## 2022-11-11 DIAGNOSIS — S90422A Blister (nonthermal), left great toe, initial encounter: Secondary | ICD-10-CM | POA: Diagnosis not present

## 2022-11-11 DIAGNOSIS — E119 Type 2 diabetes mellitus without complications: Secondary | ICD-10-CM | POA: Diagnosis not present

## 2022-11-11 DIAGNOSIS — Z7984 Long term (current) use of oral hypoglycemic drugs: Secondary | ICD-10-CM | POA: Insufficient documentation

## 2022-11-11 DIAGNOSIS — M79672 Pain in left foot: Secondary | ICD-10-CM | POA: Diagnosis present

## 2022-11-11 DIAGNOSIS — X58XXXA Exposure to other specified factors, initial encounter: Secondary | ICD-10-CM | POA: Insufficient documentation

## 2022-11-11 DIAGNOSIS — S90425A Blister (nonthermal), left lesser toe(s), initial encounter: Secondary | ICD-10-CM

## 2022-11-11 LAB — COMPREHENSIVE METABOLIC PANEL
ALT: 13 U/L (ref 0–44)
AST: 14 U/L — ABNORMAL LOW (ref 15–41)
Albumin: 3.7 g/dL (ref 3.5–5.0)
Alkaline Phosphatase: 63 U/L (ref 38–126)
Anion gap: 12 (ref 5–15)
BUN: 20 mg/dL (ref 8–23)
CO2: 25 mmol/L (ref 22–32)
Calcium: 9.6 mg/dL (ref 8.9–10.3)
Chloride: 103 mmol/L (ref 98–111)
Creatinine, Ser: 1.02 mg/dL (ref 0.61–1.24)
GFR, Estimated: >60 mL/min (ref >60)
Glucose, Bld: 97 mg/dL (ref 70–99)
Potassium: 3.4 mmol/L — ABNORMAL LOW (ref 3.5–5.1)
Sodium: 140 mmol/L (ref 135–145)
Total Bilirubin: 1.2 mg/dL (ref 0.3–1.2)
Total Protein: 7.3 g/dL (ref 6.5–8.1)

## 2022-11-11 LAB — CBC WITH DIFFERENTIAL/PLATELET
Abs Immature Granulocytes: 0.01 10*3/uL (ref 0.00–0.07)
Basophils Absolute: 0 10*3/uL (ref 0.0–0.1)
Basophils Relative: 0 %
Eosinophils Absolute: 0.2 10*3/uL (ref 0.0–0.5)
Eosinophils Relative: 3 %
HCT: 41.1 % (ref 39.0–52.0)
Hemoglobin: 13.2 g/dL (ref 13.0–17.0)
Immature Granulocytes: 0 %
Lymphocytes Relative: 38 %
Lymphs Abs: 2.4 10*3/uL (ref 0.7–4.0)
MCH: 28.2 pg (ref 26.0–34.0)
MCHC: 32.1 g/dL (ref 30.0–36.0)
MCV: 87.8 fL (ref 80.0–100.0)
Monocytes Absolute: 0.3 10*3/uL (ref 0.1–1.0)
Monocytes Relative: 5 %
Neutro Abs: 3.4 10*3/uL (ref 1.7–7.7)
Neutrophils Relative %: 54 %
Platelets: 218 10*3/uL (ref 150–400)
RBC: 4.68 MIL/uL (ref 4.22–5.81)
RDW: 14.1 % (ref 11.5–15.5)
WBC: 6.3 10*3/uL (ref 4.0–10.5)
nRBC: 0 % (ref 0.0–0.2)

## 2022-11-11 LAB — I-STAT CG4 LACTIC ACID, ED: Lactic Acid, Venous: 2.4 mmol/L (ref 0.5–1.9)

## 2022-11-11 NOTE — ED Provider Notes (Signed)
[place] EMERGENCY DEPARTMENT AT [facility] Provider Note   CSN: [id number] Arrival date & time: 11/11/22  1012     History  Chief Complaint  Patient presents with   Foot Pain   Mass    [place] is a 88 y.o. male.  This is an 88 year old male who presents to the emergency department today due to discoloration on his left great toe.  Patient has a history of type 2 diabetes.  Patient is here with his daughter who provides history.  She says that she was examining the patient's feet, noticed that he had some discoloration on his left great toe.  No fever, no chills.   Foot Pain       Home Medications Prior to Admission medications   Medication Sig Start Date End Date Taking? Authorizing Provider  amLODipine (NORVASC) 10 MG tablet Take 10 mg by mouth in the morning.    [provider]  atorvastatin (LIPITOR) 20 MG tablet take 1 tablet by mouth once daily Patient taking differently: Take 20 mg by mouth in the morning. 12/27/19   Etta Grandchild, MD  BAYER LOW DOSE 81 MG tablet Take 81 mg by mouth in the morning. Swallow whole.    [provider]  bimatoprost (LUMIGAN) 0.01 % SOLN INSTILL ONE DROP INTO LEFT EYE EVERY DAY AT BEDTIME Patient not taking: Reported on 10/29/2022 04/02/11   Etta Grandchild, MD  brimonidine-timolol (COMBIGAN) 0.2-0.5 % ophthalmic solution Place 1 drop into the right eye 2 (two) times daily. 04/02/11   Etta Grandchild, MD  donepezil (ARICEPT) 10 MG tablet Take 10 mg by mouth in the morning.    [provider]  donepezil (ARICEPT) 5 MG tablet take 1 tablet by mouth once daily Patient not taking: Reported on 10/29/2022 07/20/19   Etta Grandchild, MD  irbesartan-hydrochlorothiazide (AVALIDE) 150-12.5 MG tablet Take 1 tablet by mouth daily. 01/25/19   Etta Grandchild, MD  levETIRAcetam (KEPPRA XR) 500 MG 24 hr tablet Take 2,000 mg by mouth in the morning.    [provider]  metFORMIN (GLUCOPHAGE) 500 MG  tablet Take 500 mg by mouth daily with breakfast.    [provider]  potassium chloride SA (KLOR-CON M) 20 MEQ tablet Take 40 mEq by mouth in the morning.    [provider]      Allergies    Other, Nifedipine, Carbamazepine, Penicillins, and Phenytoin    Review of Systems   Review of Systems  Physical Exam Updated Vital Signs BP (!) 150/78 (BP Location: Left Arm)   Pulse 65   Temp 98.2 F (36.8 C) (Oral)   Resp 16   SpO2 99%  Physical Exam Constitutional:      Appearance: He is not ill-appearing.  Skin:    Comments: There is a blood blister on the pad of the left great toe.  No surrounding crepitus or erythema  Neurological:     Mental Status: He is alert.     ED Results / Procedures / Treatments   Labs (all labs ordered are listed, but only abnormal results are displayed) Labs Reviewed  COMPREHENSIVE METABOLIC PANEL - Abnormal; Notable for the following components:      Result Value   Potassium 3.4 (*)    AST 14 (*)    All other components within normal limits  I-STAT CG4 LACTIC ACID, ED - Abnormal; Notable for the following components:   Lactic Acid, Venous 2.4 (*)    All  other components within normal limits  CBC WITH DIFFERENTIAL/PLATELET    EKG None  Radiology No results found.  Procedures Procedures    Medications Ordered in ED Medications - No data to display  ED Course/ Medical Decision Making/ A&P                                 Medical Decision Making 88 year old male here today with a blister on his foot.  Plan-patient had labs done at triage which I would not have ordered.  Lactic acid was 2.4.  This patient does not have sepsis, has normal vital signs.  This is a simple blood blister.  Counseled the patient's daughter on typical wound care.  Patient was hospitalized a couple of weeks ago, daughter said that his foot had been pressed up against the base of the bed.  Patient is quite tall.  Amount and/or Complexity of  Data Reviewed Labs: ordered.           Final Clinical Impression(s) / ED Diagnoses Final diagnoses:  Blister of toe of left foot, initial encounter    Rx / DC Orders ED Discharge Orders     None         Arletha Pili, DO 11/11/22 1209

## 2022-11-11 NOTE — Discharge Instructions (Signed)
This is a blood blister that will heal over the next 2 weeks.  Try to keep the area padded.  Follow-up with your primary care doctor within 1 week.  Come back to the emergency department if you notice any increased redness or swelling like we discussed.

## 2022-11-11 NOTE — ED Triage Notes (Signed)
Pt BIB daughter from Home. Pt has a black fluid mass under his left big toe with darkened skin in between toes. Family concerned due to pt Hx of diabetes. Pt has a Hx of Dementia and hard of hearing.

## 2022-12-14 ENCOUNTER — Encounter (HOSPITAL_COMMUNITY): Payer: Self-pay

## 2022-12-14 ENCOUNTER — Emergency Department (HOSPITAL_COMMUNITY): Payer: Medicare Other

## 2022-12-14 ENCOUNTER — Emergency Department (HOSPITAL_COMMUNITY)
Admission: EM | Admit: 2022-12-14 | Discharge: 2022-12-14 | Disposition: A | Discharge status: Home / Self Care | Payer: Medicare Other | Attending: Emergency Medicine | Admitting: Emergency Medicine

## 2022-12-14 ENCOUNTER — Other Ambulatory Visit: Payer: Self-pay

## 2022-12-14 DIAGNOSIS — F0390 Unspecified dementia without behavioral disturbance: Secondary | ICD-10-CM | POA: Diagnosis not present

## 2022-12-14 DIAGNOSIS — Z8673 Personal history of transient ischemic attack (TIA), and cerebral infarction without residual deficits: Secondary | ICD-10-CM | POA: Insufficient documentation

## 2022-12-14 DIAGNOSIS — Z79899 Other long term (current) drug therapy: Secondary | ICD-10-CM | POA: Insufficient documentation

## 2022-12-14 DIAGNOSIS — I10 Essential (primary) hypertension: Secondary | ICD-10-CM | POA: Insufficient documentation

## 2022-12-14 DIAGNOSIS — R569 Unspecified convulsions: Secondary | ICD-10-CM | POA: Diagnosis present

## 2022-12-14 LAB — BASIC METABOLIC PANEL
Anion gap: 4 — ABNORMAL LOW (ref 5–15)
BUN: 22 mg/dL (ref 8–23)
CO2: 28 mmol/L (ref 22–32)
Calcium: 9 mg/dL (ref 8.9–10.3)
Chloride: 104 mmol/L (ref 98–111)
Creatinine, Ser: 1.01 mg/dL (ref 0.61–1.24)
GFR, Estimated: >60 mL/min (ref >60)
Glucose, Bld: 89 mg/dL (ref 70–99)
Potassium: 3.6 mmol/L (ref 3.5–5.1)
Sodium: 136 mmol/L (ref 135–145)

## 2022-12-14 LAB — CBC WITH DIFFERENTIAL/PLATELET
Abs Immature Granulocytes: 0.02 10*3/uL (ref 0.00–0.07)
Basophils Absolute: 0 10*3/uL (ref 0.0–0.1)
Basophils Relative: 1 %
Eosinophils Absolute: 0.1 10*3/uL (ref 0.0–0.5)
Eosinophils Relative: 1 %
HCT: 35.7 % — ABNORMAL LOW (ref 39.0–52.0)
Hemoglobin: 11.9 g/dL — ABNORMAL LOW (ref 13.0–17.0)
Immature Granulocytes: 0 %
Lymphocytes Relative: 25 %
Lymphs Abs: 1.6 10*3/uL (ref 0.7–4.0)
MCH: 28.9 pg (ref 26.0–34.0)
MCHC: 33.3 g/dL (ref 30.0–36.0)
MCV: 86.7 fL (ref 80.0–100.0)
Monocytes Absolute: 0.4 10*3/uL (ref 0.1–1.0)
Monocytes Relative: 7 %
Neutro Abs: 4.1 10*3/uL (ref 1.7–7.7)
Neutrophils Relative %: 66 %
Platelets: 228 10*3/uL (ref 150–400)
RBC: 4.12 MIL/uL — ABNORMAL LOW (ref 4.22–5.81)
RDW: 13.8 % (ref 11.5–15.5)
WBC: 6.3 10*3/uL (ref 4.0–10.5)
nRBC: 0 % (ref 0.0–0.2)

## 2022-12-14 LAB — MAGNESIUM: Magnesium: 2 mg/dL (ref 1.7–2.4)

## 2022-12-14 LAB — HEPATIC FUNCTION PANEL
ALT: 12 U/L (ref 0–44)
AST: 14 U/L — ABNORMAL LOW (ref 15–41)
Albumin: 3.6 g/dL (ref 3.5–5.0)
Alkaline Phosphatase: 63 U/L (ref 38–126)
Bilirubin, Direct: 0.2 mg/dL (ref 0.0–0.2)
Indirect Bilirubin: 1 mg/dL — ABNORMAL HIGH (ref 0.3–0.9)
Total Bilirubin: 1.2 mg/dL (ref 0.3–1.2)
Total Protein: 6.8 g/dL (ref 6.5–8.1)

## 2022-12-14 LAB — URINALYSIS, ROUTINE W REFLEX MICROSCOPIC
Bilirubin Urine: NEGATIVE
Glucose, UA: NEGATIVE mg/dL
Hgb urine dipstick: NEGATIVE
Ketones, ur: NEGATIVE mg/dL
Leukocytes,Ua: NEGATIVE
Nitrite: NEGATIVE
Protein, ur: NEGATIVE mg/dL
Specific Gravity, Urine: 1.019 (ref 1.005–1.030)
pH: 5 (ref 5.0–8.0)

## 2022-12-14 LAB — CBG MONITORING, ED: Glucose-Capillary: 117 mg/dL — ABNORMAL HIGH (ref 70–99)

## 2022-12-14 LAB — TROPONIN I (HIGH SENSITIVITY): Troponin I (High Sensitivity): 5 ng/L (ref <18)

## 2022-12-14 MED ORDER — LEVETIRACETAM IN NACL 1000 MG/100ML IV SOLN
1000.0000 mg | Freq: Once | INTRAVENOUS | Status: AC
  Administered 2022-12-14: 1000 mg via INTRAVENOUS
  Filled 2022-12-14: qty 100

## 2022-12-14 MED ORDER — LEVETIRACETAM ER 500 MG PO TB24: 1000.0000 mg | ORAL_TABLET | Freq: Two times a day (BID) | ORAL | 2 refills | Status: DC

## 2022-12-14 NOTE — ED Provider Notes (Signed)
[place] EMERGENCY DE[facility]MENT AT [facility] Provider Note   CSN: [id number] Arrival date & time: 12/14/22  1243     History  Chief Complaint  Patient presents with   Seizures    [place] is a 88 y.o. male.   Seizures Patient presents for episodes of altered mental status.  Medical history includes seizures, HTN, HLD, dementia, CVA.  Home medication includes Keppra.  He currently lives with his daughter.  He moved in with her 2 months ago.  6 weeks ago, he was admitted to the hospital for episode of syncope versus seizure.  He underwent CT head and EEG.  No seizure activity was noted during EEG.  Plan at time of discharge was to continue his current Keppra dose. Daughter reports that he has been taking all his medications as prescribed.  2 days ago, he had another episode that was similar to last month.  Episode is described as decreased responsiveness, drooling, left-sided jerking movements, and shallow breathing.  Episodes last for 2 to 3 minutes.  Following episodes, he has decreased activity for approximately 10 minutes.  He had another of these episodes today.  In between his episodes, he seems to be in his normal state of health.  He requires assistance with ambulation.  He has been eating and drinking appropriately.  His typical day is in a recliner, watching TV.  He will sleep for most of the day.  Patient, himself, denies any current areas of discomfort.     Home Medications Prior to Admission medications   Medication Sig Start Date End Date Taking? Authorizing Provider  amLODipine (NORVASC) 10 MG tablet Take 10 mg by mouth every evening.   Yes [provider]  atorvastatin (LIPITOR) 20 MG tablet take 1 tablet by mouth once daily Patient taking differently: Take 20 mg by mouth every evening. 12/27/19  Yes Etta Grandchild, MD  BAYER ASPIRIN EC LOW DOSE 81 MG tablet Take 81 mg by mouth in the morning. Swallow whole.   Yes [provider]   COMBIGAN 0.2-0.5 % ophthalmic solution Place 1 drop into the right eye every 12 (twelve) hours.   Yes [provider]  irbesartan-hydrochlorothiazide (AVALIDE) 150-12.5 MG tablet Take 1 tablet by mouth daily. 01/25/19  Yes Etta Grandchild, MD  metFORMIN (GLUCOPHAGE) 500 MG tablet Take 500 mg by mouth daily with breakfast.   Yes [provider]  bimatoprost (LUMIGAN) 0.01 % SOLN INSTILL ONE DROP INTO LEFT EYE EVERY DAY AT BEDTIME Patient not taking: Reported on 10/29/2022 04/02/11   Etta Grandchild, MD  brimonidine-timolol (COMBIGAN) 0.2-0.5 % ophthalmic solution Place 1 drop into the right eye 2 (two) times daily. Patient not taking: Reported on 12/14/2022 04/02/11   Etta Grandchild, MD  donepezil (ARICEPT) 5 MG tablet take 1 tablet by mouth once daily Patient not taking: Reported on 10/29/2022 07/20/19   Etta Grandchild, MD  levETIRAcetam (KEPPRA XR) 500 MG 24 hr tablet Take 2 tablets (1,000 mg total) by mouth 2 (two) times daily. 12/14/22 03/14/23  Gloris Manchester, MD      Allergies    Other, Nifedipine, Carbamazepine, Donepezil, Penicillins, and Phenytoin    Review of Systems   Review of Systems  Unable to perform ROS: Dementia    Physical Exam Updated Vital Signs BP 121/78   Pulse 63   Temp 98.6 F (37 C) (Oral)   Resp 16   Ht 6' 2.5" (1.892 m)   Wt 98 kg   SpO2  100%   BMI 27.37 kg/m  Physical Exam Vitals and nursing note reviewed.  Constitutional:      General: He is not in acute distress.    Appearance: Normal appearance. He is well-developed. He is not ill-appearing, toxic-appearing or diaphoretic.  HENT:     Head: Normocephalic and atraumatic.     Right Ear: External ear normal.     Left Ear: External ear normal.     Nose: Nose normal.     Mouth/Throat:     Mouth: Mucous membranes are moist.  Eyes:     Extraocular Movements: Extraocular movements intact.     Conjunctiva/sclera: Conjunctivae normal.  Cardiovascular:     Rate and Rhythm: Normal rate and  regular rhythm.  Pulmonary:     Effort: Pulmonary effort is normal. No respiratory distress.     Breath sounds: Normal breath sounds. No wheezing or rales.  Chest:     Chest wall: No tenderness.  Abdominal:     General: There is no distension.     Palpations: Abdomen is soft.     Tenderness: There is no abdominal tenderness.  Musculoskeletal:        General: No swelling. Normal range of motion.     Cervical back: Normal range of motion and neck supple.     Right lower leg: No edema.     Left lower leg: No edema.  Skin:    General: Skin is warm and dry.     Coloration: Skin is not jaundiced or pale.     Comments: Blood blister on plantar aspect of left great toe.  No tenderness.  No surrounding erythema.  Neurological:     General: No focal deficit present.     Mental Status: He is alert. Mental status is at baseline. He is disoriented.  Psychiatric:        Mood and Affect: Mood normal.        Behavior: Behavior normal.     ED Results / Procedures / Treatments   Labs (all labs ordered are listed, but only abnormal results are displayed) Labs Reviewed  CBC WITH DIFFERENTIAL/PLATELET - Abnormal; Notable for the following components:      Result Value   RBC 4.12 (*)    Hemoglobin 11.9 (*)    HCT 35.7 (*)    All other components within normal limits  BASIC METABOLIC PANEL - Abnormal; Notable for the following components:   Anion gap 4 (*)    All other components within normal limits  HEPATIC FUNCTION PANEL - Abnormal; Notable for the following components:   AST 14 (*)    Indirect Bilirubin 1.0 (*)    All other components within normal limits  CBG MONITORING, ED - Abnormal; Notable for the following components:   Glucose-Capillary 117 (*)    All other components within normal limits  MAGNESIUM  URINALYSIS, ROUTINE W REFLEX MICROSCOPIC  LEVETIRACETAM LEVEL  TROPONIN I (HIGH SENSITIVITY)  TROPONIN I (HIGH SENSITIVITY)    EKG EKG Interpretation Date/Time:  Saturday  December 14 2022 13:03:13 EDT Ventricular Rate:  75 PR Interval:  172 QRS Duration:  79 QT Interval:  360 QTC Calculation: 402 R Axis:   32  Text Interpretation: Sinus rhythm Borderline ST elevation, inferior leads Confirmed by Gloris Manchester ([id number]) on 12/14/2022 2:14:56 PM  Radiology CT Head Wo Contrast  Result Date: 12/14/2022 CLINICAL DATA:  Possible seizure this morning EXAM: CT HEAD WITHOUT CONTRAST CT CERVICAL SPINE WITHOUT CONTRAST TECHNIQUE: Multidetector CT imaging of the head  and cervical spine was performed following the standard protocol without intravenous contrast. Multiplanar CT image reconstructions of the cervical spine were also generated. RADIATION DOSE REDUCTION: This exam was performed according to the departmental dose-optimization program which includes automated exposure control, adjustment of the mA and/or kV according to patient size and/or use of iterative reconstruction technique. COMPARISON:  10/29/2022 FINDINGS: CT HEAD FINDINGS Brain: Unchanged caliber and configuration of the ventricles right parietal approach intraventricular shunt catheter, tip within the right lateral ventricle. Unchanged left temporoparietal encephalomalacia with periventricular and deep white matter hypodensity. No evidence of acute infarction, hemorrhage, hydrocephalus, extra-axial collection or mass lesion/mass effect. Vascular: No hyperdense vessel or unexpected calcification. Skull: Status post left pterional craniotomy. Negative for fracture or focal lesion. Sinuses/Orbits: No acute finding. Other: None. CT CERVICAL SPINE FINDINGS Alignment: Normal. Skull base and vertebrae: No acute fracture. No primary bone lesion or focal pathologic process. Soft tissues and spinal canal: No prevertebral fluid or swelling. No visible canal hematoma. Disc levels: Mild-to-moderate multilevel disc space height loss and osteophytosis, worst from C5-C7. Upper chest: Negative. Other: None. IMPRESSION: 1. No acute  intracranial pathology. 2. Unchanged caliber and configuration of the ventricles with right parietal approach intraventricular shunt catheter, tip within the right lateral ventricle. 3. Unchanged left temporoparietal encephalomalacia with periventricular and deep white matter hypodensity. 4. No fracture or static subluxation of the cervical spine. 5. Mild-to-moderate multilevel cervical disc degenerative disease. Electronically Signed   By: Jearld Lesch M.D.   On: 12/14/2022 14:56   CT Cervical Spine Wo Contrast  Result Date: 12/14/2022 CLINICAL DATA:  Possible seizure this morning EXAM: CT HEAD WITHOUT CONTRAST CT CERVICAL SPINE WITHOUT CONTRAST TECHNIQUE: Multidetector CT imaging of the head and cervical spine was performed following the standard protocol without intravenous contrast. Multiplanar CT image reconstructions of the cervical spine were also generated. RADIATION DOSE REDUCTION: This exam was performed according to the departmental dose-optimization program which includes automated exposure control, adjustment of the mA and/or kV according to patient size and/or use of iterative reconstruction technique. COMPARISON:  10/29/2022 FINDINGS: CT HEAD FINDINGS Brain: Unchanged caliber and configuration of the ventricles right parietal approach intraventricular shunt catheter, tip within the right lateral ventricle. Unchanged left temporoparietal encephalomalacia with periventricular and deep white matter hypodensity. No evidence of acute infarction, hemorrhage, hydrocephalus, extra-axial collection or mass lesion/mass effect. Vascular: No hyperdense vessel or unexpected calcification. Skull: Status post left pterional craniotomy. Negative for fracture or focal lesion. Sinuses/Orbits: No acute finding. Other: None. CT CERVICAL SPINE FINDINGS Alignment: Normal. Skull base and vertebrae: No acute fracture. No primary bone lesion or focal pathologic process. Soft tissues and spinal canal: No prevertebral  fluid or swelling. No visible canal hematoma. Disc levels: Mild-to-moderate multilevel disc space height loss and osteophytosis, worst from C5-C7. Upper chest: Negative. Other: None. IMPRESSION: 1. No acute intracranial pathology. 2. Unchanged caliber and configuration of the ventricles with right parietal approach intraventricular shunt catheter, tip within the right lateral ventricle. 3. Unchanged left temporoparietal encephalomalacia with periventricular and deep white matter hypodensity. 4. No fracture or static subluxation of the cervical spine. 5. Mild-to-moderate multilevel cervical disc degenerative disease. Electronically Signed   By: Jearld Lesch M.D.   On: 12/14/2022 14:56   DG Chest Port 1 View  Result Date: 12/14/2022 CLINICAL DATA:  Seizure EXAM: PORTABLE CHEST 1 VIEW COMPARISON:  10/29/2022 FINDINGS: Shunt tubing projects over the right chest and right neck. Atherosclerotic calcification of the aortic arch. Lungs appear clear. Cardiac and mediastinal margins appear normal. Mild thoracic  spondylosis. Degenerative right glenohumeral arthropathy. IMPRESSION: 1. No acute findings. 2. Aortic Atherosclerosis (ICD10-I70.0). Electronically Signed   By: Gaylyn Rong M.D.   On: 12/14/2022 14:24    Procedures Procedures    Medications Ordered in ED Medications  levETIRAcetam (KEPPRA) IVPB 1000 mg/100 mL premix (has no administration in time range)    ED Course/ Medical Decision Making/ A&P                                 Medical Decision Making Amount and/or Complexity of Data Reviewed Labs: ordered. Radiology: ordered.  Risk Prescription drug management.   This patient presents to the ED for concern of seizure-like activity, this involves an extensive number of treatment options, and is a complaint that carries with it a high risk of complications and morbidity.  The differential diagnosis includes breakthrough seizure, medication nonadherence, infection, metabolic  arrangements, CVA, neoplasm, syncope   Co morbidities that complicate the patient evaluation  seizures, HTN, HLD, dementia, CVA   Additional history obtained:  Additional history obtained from patient's daughter External records from outside source obtained and reviewed including EMR   Lab Tests:  I Ordered, and personally interpreted labs.  The pertinent results include: Normal hemoglobin, no leukocytosis, normal kidney function, normal electrolytes, no evidence of UTI, normal troponin   Imaging Studies ordered:  I ordered imaging studies including chest x-ray, CT of head and cervical spine I independently visualized and interpreted imaging which showed no acute findings I agree with the radiologist interpretation   Cardiac Monitoring: / EKG:  The patient was maintained on a cardiac monitor.  I personally viewed and interpreted the cardiac monitored which showed an underlying rhythm of: Sinus rhythm   Consultations Obtained:  I requested consultation with the neurologist, Dr. Amada Jupiter,  and discussed lab and imaging findings as well as pertinent plan - they recommend: Spacing of home Keppra dosing and outpatient follow-up   Problem List / ED Course / Critical interventions / Medication management  Patient presents for episode of seizure-like activity.  He had a similar episode 6 weeks ago, for which she underwent a hospital observation.  He had additional episodes 2 days ago, as well as today.  On arrival in the ED, vital signs are normal.  Patient is well-appearing on exam.  He is unable to provide history due to underlying dementia.  His daughter does accompany him at bedside.  She reports that he has been adherent to all his home medications.  Patient, himself, denies any current areas of discomfort.  Currently his breathing is unlabored.  Abdomen is soft and nontender.  Neuroexam is limited by his dementia.  He does not appear to have any focal neurologic deficits.   Broad workup was initiated.  Initial lab work and CT imaging results were unremarkable.  I spoke with neurologist on-call, Dr. Amada Jupiter, who feels that patient may benefit from spacing out his doses of Keppra.  I checked with the patient's daughter.  Both episodes today and 2 days ago occurred prior to his morning dose of Keppra.  It does appear that he was previously on twice daily dosing but this was switched to 2 g once a day in the mornings.  Patient's episodes have occurred at trough levels of Keppra.  Originally, MRI was planned.  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  MRI testing today was deferred.  Patient has no evidence of infection  that might lower seizure threshold.  At this point, plan will be for spacing of his Keppra to twice a day and outpatient follow-up.  Family does feel comfortable with this plan.  Patient was discharged in stable condition. I ordered medication including Keppra for seizure prophylaxis Reevaluation of the patient after these medicines showed that the patient improved I have reviewed the patients home medicines and have made adjustments as needed   Social Determinants of Health:  Lives at home with family         Final Clinical Impression(s) / ED Diagnoses Final diagnoses:  Seizure-like activity (HCC)    Rx / DC Orders ED Discharge Orders          Ordered    levETIRAcetam (KEPPRA XR) 500 MG 24 hr tablet  2 times daily        12/14/22 1632              Gloris Manchester, MD 12/14/22 1727

## 2022-12-14 NOTE — ED Notes (Signed)
IV attempted x2 without success.

## 2022-12-14 NOTE — Discharge Instructions (Signed)
Test results today are reassuring.  No underlying cause for your breakthrough seizure-like episodes was identified.  It may be due to low levels of your seizure medicine.  To avoid these low levels, change Keppra dosing to 1000 mg twice daily.  Call the telephone number below to set up a follow-up appointment with your previous neurologist.  Return to the emergency department for any new or worsening symptoms of concern.

## 2022-12-14 NOTE — ED Triage Notes (Signed)
Patient's daughter reports possible seizure this AM and on Thursday. States he starts to shake and drool during these episodes. Patient has hx of seizures and takes keppra. Patient has dementia and is confused at baseline. Also reports patient had multiple falls last week.

## 2022-12-16 ENCOUNTER — Ambulatory Visit (INDEPENDENT_AMBULATORY_CARE_PROVIDER_SITE_OTHER): Payer: Medicare Other | Admitting: Podiatry

## 2022-12-16 DIAGNOSIS — B351 Tinea unguium: Secondary | ICD-10-CM | POA: Diagnosis not present

## 2022-12-16 DIAGNOSIS — T148XXA Other injury of unspecified body region, initial encounter: Secondary | ICD-10-CM | POA: Diagnosis not present

## 2022-12-16 DIAGNOSIS — M79674 Pain in right toe(s): Secondary | ICD-10-CM

## 2022-12-16 DIAGNOSIS — M79675 Pain in left toe(s): Secondary | ICD-10-CM | POA: Diagnosis not present

## 2022-12-16 DIAGNOSIS — E119 Type 2 diabetes mellitus without complications: Secondary | ICD-10-CM | POA: Insufficient documentation

## 2022-12-16 DIAGNOSIS — E11628 Type 2 diabetes mellitus with other skin complications: Secondary | ICD-10-CM | POA: Diagnosis not present

## 2022-12-16 NOTE — Progress Notes (Signed)
Subjective:  Patient ID: William Mills, male    DOB: [birth year],  MRN: [id number]  William Mills presents to clinic today for:  Chief Complaint  Patient presents with   Blister     Presents today  due to discoloration on his left great toe.   Patient notes nails are thick, discolored, elongated and painful in shoegear when trying to ambulate.  His daughter is with him today.  She notes that they were seen in the emergency room approximately 1 month ago for a blood blister on the plantar aspect of the left great toe.  Does not recall any injury or foreign body.  They have been putting a dressing on this daily.  She notes that it is dry & hard now.  Patient was wearing soft, moccasin-style house slippers today.  PCP is Paliwal, Himanshu, MD.  Past Medical History:  Diagnosis Date   Cerebrovascular accident (HCC)    Dementia (HCC)    Head injury 1970   right frontal   Hyperlipidemia    Hypertension    Seizure disorder (HCC)     Past Surgical History:  Procedure Laterality Date   release of right undescended testicle      Allergies  Allergen Reactions   Other Other (See Comments)    Can have NO MRI(s) because of a metal plate in his head   Nifedipine Other (See Comments)    Edema    Carbamazepine Other (See Comments)    Reaction unknown   Donepezil Other (See Comments)    Was SUSPENDED by the patient's MD due to the fact it was [facility] HIS HEART RATE TOO LOW   Penicillins Other (See Comments)    Reaction unknown   Phenytoin Other (See Comments)    Reaction unknown    Review of Systems: Negative except as noted in the HPI.  Objective:  Kervens Gregorio is a pleasant 88 y.o. male in NAD. AAO x 3.  Vascular Examination: Capillary refill time is 3-5 seconds to toes bilateral.  Trace palpable pedal pulses b/l LE. Digital hair sparse b/l.  Skin temperature gradient WNL b/l. No varicosities b/l. No cyanosis noted b/l.   Dermatological Examination: Pedal skin  with normal turgor, texture and tone b/l. No open wounds. No interdigital macerations b/l. Toenails x10 are 3mm thick, discolored, dystrophic with subungual debris. There is pain with compression of the nail plates.  They are elongated x10.  There is a 2.4 cm x 1.7 cm dry superficial blood blister noted on the plantar aspect of the left hallux.  This is dry and stable.  It is very hard.  There is no active drainage.  There is no fluctuance or bogginess to the area.  There is no surrounding erythema.  There is no ulceration noted  Neurological Examination: Protective sensation intact bilateral LE.   Musculoskeletal Examination: Lesser hammertoes noted bilateral     Latest Ref Rng & Units 10/29/2022    9:40 PM  Hemoglobin A1C  Hemoglobin-A1c 4.8 - 5.6 % 6.0    Assessment/Plan: 1. Blood blister   2. Type 2 diabetes mellitus with other skin complication, without long-term current use of insulin (HCC)   3. Pain due to onychomycosis of toenails of both feet     The mycotic toenails were sharply debrided x10 with sterile nail nippers and a power debriding burr to decrease bulk/thickness and length.    The hard/dry blood blister on the plantar aspect the left hallux was shaved with a sanding bur.  This ensured that not too much of the stable blister is removed.  The remainder will begin to slough off on its own as the skin cells turn over quicker now.  No dressing is necessary at this time.  Advised the patient's daughter to keep a close eye on the area to call if any signs of skin breakdown are noted.  Will get the patient set up with a diabetic shoe consult.  He would benefit from these shoes with custom insoles to offload the plantar aspect of the hallux to prevent recurrence of the preulcerative lesion/blood blister.  Return in about 3 months (around 03/18/2023) for [facility].   Clerance Lav, DPM, FACFAS [facility]     [address] [place], [place] [address]                Office [phone]  Fax [phone]

## 2022-12-17 LAB — LEVETIRACETAM LEVEL: Levetiracetam Lvl: 57.4 ug/mL — ABNORMAL HIGH (ref 10.0–40.0)

## 2023-01-01 ENCOUNTER — Encounter (HOSPITAL_COMMUNITY): Payer: Self-pay

## 2023-01-01 ENCOUNTER — Other Ambulatory Visit: Payer: Self-pay

## 2023-01-01 ENCOUNTER — Emergency Department (HOSPITAL_COMMUNITY): Payer: Medicare Other

## 2023-01-01 ENCOUNTER — Emergency Department (HOSPITAL_COMMUNITY)
Admission: EM | Admit: 2023-01-01 | Discharge: 2023-01-02 | Disposition: A | Discharge status: Home / Self Care | Payer: Medicare Other | Attending: Emergency Medicine | Admitting: Emergency Medicine

## 2023-01-01 DIAGNOSIS — R569 Unspecified convulsions: Secondary | ICD-10-CM | POA: Insufficient documentation

## 2023-01-01 DIAGNOSIS — Z79899 Other long term (current) drug therapy: Secondary | ICD-10-CM | POA: Insufficient documentation

## 2023-01-01 DIAGNOSIS — W19XXXA Unspecified fall, initial encounter: Secondary | ICD-10-CM | POA: Diagnosis not present

## 2023-01-01 DIAGNOSIS — I10 Essential (primary) hypertension: Secondary | ICD-10-CM | POA: Insufficient documentation

## 2023-01-01 DIAGNOSIS — E860 Dehydration: Secondary | ICD-10-CM | POA: Diagnosis not present

## 2023-01-01 DIAGNOSIS — F0390 Unspecified dementia without behavioral disturbance: Secondary | ICD-10-CM | POA: Diagnosis not present

## 2023-01-01 LAB — CBC
HCT: 41.4 % (ref 39.0–52.0)
Hemoglobin: 13.4 g/dL (ref 13.0–17.0)
MCH: 27.5 pg (ref 26.0–34.0)
MCHC: 32.4 g/dL (ref 30.0–36.0)
MCV: 85 fL (ref 80.0–100.0)
Platelets: 247 10*3/uL (ref 150–400)
RBC: 4.87 MIL/uL (ref 4.22–5.81)
RDW: 13.6 % (ref 11.5–15.5)
WBC: 7.2 10*3/uL (ref 4.0–10.5)
nRBC: 0 % (ref 0.0–0.2)

## 2023-01-01 LAB — BASIC METABOLIC PANEL
Anion gap: 12 (ref 5–15)
BUN: 15 mg/dL (ref 8–23)
CO2: 25 mmol/L (ref 22–32)
Calcium: 9.5 mg/dL (ref 8.9–10.3)
Chloride: 102 mmol/L (ref 98–111)
Creatinine, Ser: 1.35 mg/dL — ABNORMAL HIGH (ref 0.61–1.24)
GFR, Estimated: 51 mL/min — ABNORMAL LOW (ref >60)
Glucose, Bld: 111 mg/dL — ABNORMAL HIGH (ref 70–99)
Potassium: 3.7 mmol/L (ref 3.5–5.1)
Sodium: 139 mmol/L (ref 135–145)

## 2023-01-01 MED ORDER — SODIUM CHLORIDE 0.9 % IV BOLUS
1000.0000 mL | Freq: Once | INTRAVENOUS | Status: AC
  Administered 2023-01-01: 1000 mL via INTRAVENOUS

## 2023-01-01 NOTE — ED Provider Notes (Signed)
[place] EMERGENCY DEPARTMENT AT [facility] Provider Note   CSN: [id number] Arrival date & time: 01/01/23  2126     History  Chief Complaint  Patient presents with   Loss of Consciousness   Fall    William Mills is a 88 y.o. male with past medical history seen for dementia, hypertension, epilepsy, hyperlipidemia, head injury, CVA; family reports that he has a shunt or some other device in the head on the right from his previous CVA, that is not MRI compatible.  They do not think that he has hydrocephalus.  Patient this evening felt dizzy while going to the bathroom and was found by family slumped over against the door, head was against the wall.  They do report that he was confused and initially slightly difficult to arouse.  They report that he seems somewhat similar to his last evaluation for possible seizure-like activity.  He has been taking his Keppra as prescribed, reports that they switched to taking 2 pills in the morning and 2 pills at night since his last evaluation in the hospital.  Patient denies any ongoing dizziness, chest pain, shortness of breath,   Loss of Consciousness Fall       Home Medications Prior to Admission medications   Medication Sig Start Date End Date Taking? Authorizing Provider  amLODipine (NORVASC) 10 MG tablet Take 10 mg by mouth every evening.    [provider]  atorvastatin (LIPITOR) 20 MG tablet take 1 tablet by mouth once daily Patient taking differently: Take 20 mg by mouth every evening. 12/27/19   Etta Grandchild, MD  BAYER ASPIRIN EC LOW DOSE 81 MG tablet Take 81 mg by mouth in the morning. Swallow whole.    [provider]  bimatoprost (LUMIGAN) 0.01 % SOLN INSTILL ONE DROP INTO LEFT EYE EVERY DAY AT BEDTIME Patient not taking: Reported on 10/29/2022 04/02/11   Etta Grandchild, MD  brimonidine-timolol (COMBIGAN) 0.2-0.5 % ophthalmic solution Place 1 drop into the right eye 2 (two) times daily. Patient not  taking: Reported on 12/14/2022 04/02/11   Etta Grandchild, MD  COMBIGAN 0.2-0.5 % ophthalmic solution Place 1 drop into the right eye every 12 (twelve) hours.    [provider]  donepezil (ARICEPT) 5 MG tablet take 1 tablet by mouth once daily Patient not taking: Reported on 10/29/2022 07/20/19   Etta Grandchild, MD  irbesartan-hydrochlorothiazide (AVALIDE) 150-12.5 MG tablet Take 1 tablet by mouth daily. 01/25/19   Etta Grandchild, MD  levETIRAcetam (KEPPRA XR) 500 MG 24 hr tablet Take 2 tablets (1,000 mg total) by mouth 2 (two) times daily. 12/14/22 03/14/23  Gloris Manchester, MD  metFORMIN (GLUCOPHAGE) 500 MG tablet Take 500 mg by mouth daily with breakfast.    [provider]      Allergies    Other, Nifedipine, Carbamazepine, Donepezil, Penicillins, and Phenytoin    Review of Systems   Review of Systems  Cardiovascular:  Positive for syncope.  All other systems reviewed and are negative.   Physical Exam Updated Vital Signs BP (!) 148/72   Pulse 67   Temp 97.6 F (36.4 C) (Oral)   Resp 10   Ht 6\' 2"  (1.88 m)   Wt 90.7 kg   SpO2 100%   BMI 25.68 kg/m  Physical Exam Vitals and nursing note reviewed.  Constitutional:      General: He is not in acute distress.    Appearance: Normal appearance.  HENT:     Head:  Normocephalic and atraumatic.  Eyes:     General:        Right eye: No discharge.        Left eye: No discharge.  Cardiovascular:     Rate and Rhythm: Normal rate and regular rhythm.     Heart sounds: No murmur heard.    No friction rub. No gallop.  Pulmonary:     Effort: Pulmonary effort is normal.     Breath sounds: Normal breath sounds.  Abdominal:     General: Bowel sounds are normal.     Palpations: Abdomen is soft.  Musculoskeletal:     Comments: Moving all 4 limbs spontaneously  Skin:    General: Skin is warm and dry.     Capillary Refill: Capillary refill takes less than 2 seconds.  Neurological:     Mental Status: He is alert. Mental  status is at baseline. He is disoriented.  Psychiatric:        Mood and Affect: Mood normal.        Behavior: Behavior normal.     ED Results / Procedures / Treatments   Labs (all labs ordered are listed, but only abnormal results are displayed) Labs Reviewed  BASIC METABOLIC PANEL - Abnormal; Notable for the following components:      Result Value   Glucose, Bld 111 (*)    Creatinine, Ser 1.35 (*)    GFR, Estimated 51 (*)    All other components within normal limits  CBC  LEVETIRACETAM LEVEL  CBG MONITORING, ED    EKG EKG Interpretation Date/Time:  Wednesday January 01 2023 21:29:31 EST Ventricular Rate:  67 PR Interval:  158 QRS Duration:  84 QT Interval:  389 QTC Calculation: 411 R Axis:   12  Text Interpretation: Sinus rhythm no wpw, prolonged qt or brugada Otherwise no significant change Confirmed by Melene Plan [phone]) on 01/01/2023 9:56:11 PM  Radiology CT Head Wo Contrast  Result Date: 01/01/2023 CLINICAL DATA:  Fall with head and neck trauma EXAM: CT HEAD WITHOUT CONTRAST CT CERVICAL SPINE WITHOUT CONTRAST TECHNIQUE: Multidetector CT imaging of the head and cervical spine was performed following the standard protocol without intravenous contrast. Multiplanar CT image reconstructions of the cervical spine were also generated. RADIATION DOSE REDUCTION: This exam was performed according to the departmental dose-optimization program which includes automated exposure control, adjustment of the mA and/or kV according to patient size and/or use of iterative reconstruction technique. COMPARISON:  CT head and cervical spine 12/14/2022 FINDINGS: CT HEAD FINDINGS Brain: No intracranial hemorrhage, mass effect, or evidence of acute infarct. No hydrocephalus. No extra-axial fluid collection. Age related cerebral atrophy and chronic small vessel ischemic disease. Right parietal approach intraventricular shunt catheter is unchanged in position. Stable ventricular caliber. Unchanged  left temporoparietal encephalomalacia. Vascular: No hyperdense vessel. Intracranial arterial calcification. Skull: No fracture or focal lesion. Status post left pterional craniotomy. Sinuses/Orbits: No acute finding. Other: None. CT CERVICAL SPINE FINDINGS Alignment: No evidence of traumatic malalignment. Skull base and vertebrae: No acute fracture. No primary bone lesion or focal pathologic process. Soft tissues and spinal canal: No prevertebral fluid or swelling. No visible canal hematoma. Disc levels: Multilevel age-related spondylosis, unchanged from 12/14/2022. No severe spinal canal narrowing. Upper chest: No acute abnormality. Other: Carotid calcification.  Shunt catheter in the right neck. IMPRESSION: 1. No acute intracranial abnormality. 2. Unchanged left temporoparietal encephalomalacia. 3. Unchanged right parietal approach intraventricular shunt catheter. Stable ventricular caliber. 4. No acute fracture in the cervical spine. Electronically Signed  By: Minerva Fester M.D.   On: 01/01/2023 22:57   CT Cervical Spine Wo Contrast  Result Date: 01/01/2023 CLINICAL DATA:  Fall with head and neck trauma EXAM: CT HEAD WITHOUT CONTRAST CT CERVICAL SPINE WITHOUT CONTRAST TECHNIQUE: Multidetector CT imaging of the head and cervical spine was performed following the standard protocol without intravenous contrast. Multiplanar CT image reconstructions of the cervical spine were also generated. RADIATION DOSE REDUCTION: This exam was performed according to the departmental dose-optimization program which includes automated exposure control, adjustment of the mA and/or kV according to patient size and/or use of iterative reconstruction technique. COMPARISON:  CT head and cervical spine 12/14/2022 FINDINGS: CT HEAD FINDINGS Brain: No intracranial hemorrhage, mass effect, or evidence of acute infarct. No hydrocephalus. No extra-axial fluid collection. Age related cerebral atrophy and chronic small vessel ischemic  disease. Right parietal approach intraventricular shunt catheter is unchanged in position. Stable ventricular caliber. Unchanged left temporoparietal encephalomalacia. Vascular: No hyperdense vessel. Intracranial arterial calcification. Skull: No fracture or focal lesion. Status post left pterional craniotomy. Sinuses/Orbits: No acute finding. Other: None. CT CERVICAL SPINE FINDINGS Alignment: No evidence of traumatic malalignment. Skull base and vertebrae: No acute fracture. No primary bone lesion or focal pathologic process. Soft tissues and spinal canal: No prevertebral fluid or swelling. No visible canal hematoma. Disc levels: Multilevel age-related spondylosis, unchanged from 12/14/2022. No severe spinal canal narrowing. Upper chest: No acute abnormality. Other: Carotid calcification.  Shunt catheter in the right neck. IMPRESSION: 1. No acute intracranial abnormality. 2. Unchanged left temporoparietal encephalomalacia. 3. Unchanged right parietal approach intraventricular shunt catheter. Stable ventricular caliber. 4. No acute fracture in the cervical spine. Electronically Signed   By: Minerva Fester M.D.   On: 01/01/2023 22:57    Procedures Procedures    Medications Ordered in ED Medications  sodium chloride 0.9 % bolus 1,000 mL (1,000 mLs Intravenous New Bag/Given 01/01/23 2211)    ED Course/ Medical Decision Making/ A&P                                 Medical Decision Making  This patient is a 88 y.o. male  who presents to the ED for concern of fall, dizziness.   Differential diagnoses prior to evaluation: The emergent differential diagnosis includes, but is not limited to,  CVA, ACS, arrhythmia, vasovagal syncope, orthostatic hypotension, sepsis, hypoglycemia, electrolyte disturbance, respiratory failure, symptomatic anemia, dehydration, heat injury, polypharmacy, malignancy, anxiety/panic attack. This is not an exhaustive differential.   Past Medical History / Co-morbidities / Social  History: dementia, hypertension, epilepsy, hyperlipidemia, head injury, CVA  Additional history: Chart reviewed. Pertinent results include: Extensively reviewed lab work, imaging from recent previous emergency department visits, hospital admission  Physical Exam: Physical exam performed. The pertinent findings include: disoriented but overall at baseline, no focal neurologic deficits noted  Lab Tests/Imaging studies: I personally interpreted labs/imaging and the pertinent results include:  cbc unremarkable, bmp shows elevated creatinine at 1.35, elevated from baseline without 1.5x recent baseline, will rehydrate with IV fluids. I independently interpreted CT head wo contrast, CT C spine wo contrast which show:  1. No acute intracranial abnormality.  2. Unchanged left temporoparietal encephalomalacia.  3. Unchanged right parietal approach intraventricular shunt  catheter. Stable ventricular caliber.  4. No acute fracture in the cervical spine.  I agree with the radiologist interpretation.  Plan to follow-up on his Keppra with the neurologist.  Ambulatory referral to neurology placed.  Cardiac monitoring:  EKG obtained and interpreted by myself and attending physician which shows: NSR, no prolonged QT, wpw, or brugada   Medications: I ordered medication including fluid bolus for dizziness, syncope vs seizure like activity.  I have reviewed the patients home medicines and have made adjustments as needed.   Disposition: After consideration of the diagnostic results and the patients response to treatment, I feel that patient is stable for discharge, possible fall related to syncope from dehydration versus seizure-like activity, unclear etiology.   emergency department workup does not suggest an emergent condition requiring admission or immediate intervention beyond what has been performed at this time. The plan is: as above. The patient is safe for discharge and has been instructed to return  immediately for worsening symptoms, change in symptoms or any other concerns.  Final Clinical Impression(s) / ED Diagnoses Final diagnoses:  Fall, initial encounter  Seizure-like activity (HCC)  Dehydration    Rx / DC Orders ED Discharge Orders          Ordered    Ambulatory referral to Neurology       Comments: An appointment is requested in approximately: 2 weeks   01/01/23 2313              Olene Floss, PA-C 01/01/23 2317    Melene Plan, DO 01/02/23 1523

## 2023-01-01 NOTE — ED Triage Notes (Signed)
Per EMS pt had syncopal episode at home just PTA. EMS reports that family heard pt fall. Found him leaning against the door. Pt reports he was going to the bathroom, got dizzy and "passed out".

## 2023-01-01 NOTE — ED Notes (Signed)
Patient transported to CT 

## 2023-01-01 NOTE — Discharge Instructions (Addendum)
Your workup was reassuring in the emergency department today, I have placed a referral to the neurologist to follow-up and see whether they would like to make any adjustments to your seizure medication.

## 2023-01-03 LAB — LEVETIRACETAM LEVEL: Levetiracetam Lvl: 16.4 ug/mL (ref 10.0–40.0)

## 2023-01-06 ENCOUNTER — Other Ambulatory Visit: Payer: Medicare Other

## 2023-01-25 ENCOUNTER — Other Ambulatory Visit: Payer: Self-pay

## 2023-01-25 ENCOUNTER — Emergency Department: Payer: Medicare Other

## 2023-01-25 ENCOUNTER — Emergency Department
Admission: EM | Admit: 2023-01-25 | Discharge: 2023-01-25 | Disposition: A | Discharge status: Home / Self Care | Payer: Medicare Other | Attending: Emergency Medicine | Admitting: Emergency Medicine

## 2023-01-25 DIAGNOSIS — I10 Essential (primary) hypertension: Secondary | ICD-10-CM | POA: Insufficient documentation

## 2023-01-25 DIAGNOSIS — W19XXXA Unspecified fall, initial encounter: Secondary | ICD-10-CM | POA: Diagnosis not present

## 2023-01-25 DIAGNOSIS — E119 Type 2 diabetes mellitus without complications: Secondary | ICD-10-CM | POA: Diagnosis not present

## 2023-01-25 DIAGNOSIS — Y92199 Unspecified place in other specified residential institution as the place of occurrence of the external cause: Secondary | ICD-10-CM | POA: Insufficient documentation

## 2023-01-25 DIAGNOSIS — E876 Hypokalemia: Secondary | ICD-10-CM | POA: Insufficient documentation

## 2023-01-25 DIAGNOSIS — F0390 Unspecified dementia without behavioral disturbance: Secondary | ICD-10-CM | POA: Diagnosis not present

## 2023-01-25 LAB — URINALYSIS, ROUTINE W REFLEX MICROSCOPIC
Bilirubin Urine: NEGATIVE
Glucose, UA: NEGATIVE mg/dL
Hgb urine dipstick: NEGATIVE
Ketones, ur: 5 mg/dL — AB
Leukocytes,Ua: NEGATIVE
Nitrite: NEGATIVE
Protein, ur: NEGATIVE mg/dL
Specific Gravity, Urine: 1.016 (ref 1.005–1.030)
pH: 5 (ref 5.0–8.0)

## 2023-01-25 LAB — CBC
HCT: 38.4 % — ABNORMAL LOW (ref 39.0–52.0)
Hemoglobin: 12.9 g/dL — ABNORMAL LOW (ref 13.0–17.0)
MCH: 28.3 pg (ref 26.0–34.0)
MCHC: 33.6 g/dL (ref 30.0–36.0)
MCV: 84.2 fL (ref 80.0–100.0)
Platelets: 264 10*3/uL (ref 150–400)
RBC: 4.56 MIL/uL (ref 4.22–5.81)
RDW: 13.8 % (ref 11.5–15.5)
WBC: 8.5 10*3/uL (ref 4.0–10.5)
nRBC: 0 % (ref 0.0–0.2)

## 2023-01-25 LAB — BASIC METABOLIC PANEL
Anion gap: 12 (ref 5–15)
BUN: 15 mg/dL (ref 8–23)
CO2: 27 mmol/L (ref 22–32)
Calcium: 9.4 mg/dL (ref 8.9–10.3)
Chloride: 101 mmol/L (ref 98–111)
Creatinine, Ser: 0.93 mg/dL (ref 0.61–1.24)
GFR, Estimated: >60 mL/min (ref >60)
Glucose, Bld: 106 mg/dL — ABNORMAL HIGH (ref 70–99)
Potassium: 2.8 mmol/L — ABNORMAL LOW (ref 3.5–5.1)
Sodium: 140 mmol/L (ref 135–145)

## 2023-01-25 MED ORDER — POTASSIUM CHLORIDE CRYS ER 20 MEQ PO TBCR: 20.0000 meq | EXTENDED_RELEASE_TABLET | Freq: Two times a day (BID) | ORAL | 0 refills | Status: DC

## 2023-01-25 MED ORDER — POTASSIUM CHLORIDE CRYS ER 20 MEQ PO TBCR
40.0000 meq | EXTENDED_RELEASE_TABLET | Freq: Once | ORAL | Status: AC
  Administered 2023-01-25: 40 meq via ORAL
  Filled 2023-01-25: qty 2

## 2023-01-25 MED ORDER — LEVETIRACETAM 500 MG PO TABS
1000.0000 mg | ORAL_TABLET | Freq: Once | ORAL | Status: AC
  Administered 2023-01-25: 1000 mg via ORAL
  Filled 2023-01-25: qty 2

## 2023-01-25 MED ORDER — LEVETIRACETAM ER 500 MG PO TB24: 1000.0000 mg | ORAL_TABLET | Freq: Two times a day (BID) | ORAL | 2 refills | Status: AC

## 2023-01-25 NOTE — ED Notes (Signed)
The pt's bed was soiled with urine. The pt and his bed were cleaned and changed without incident.

## 2023-01-25 NOTE — Discharge Instructions (Signed)
Please follow-up with your regular doctor in the next couple days for reassessment.  No evidence of trauma to the head or neck.  His urine did not show any signs of infection.  His potassium was slightly low so please have them give the potassium supplements twice daily.  Make sure he is taking his Keppra as prescribed.  Please return for any worsening issues.  Have PT/OT work with him over the next week for strength improvement.

## 2023-01-25 NOTE — ED Triage Notes (Signed)
Pt to ED [facility] from [facility] for unwitnessed fall. Reports foul urine. Dementia. Unknown if hit head. Takes ASA. No obvious injuries. VSS

## 2023-01-25 NOTE — ED Notes (Signed)
Transport called for patient at [facility]

## 2023-01-25 NOTE — ED Provider Notes (Signed)
[facility] Provider Note    Event Date/Time   First MD Initiated Contact with Patient 01/25/23 0901     (approximate)   History   Fall   HPI William Mills is a 88 y.o. male with history of dementia, DM2, HTN, HLD presenting today for fall.  Patient reportedly had an unwitnessed fall at his assisted living facility.  They were not sure if he hit his head and send him to the ED for further evaluation.  They also noted potentially foul-smelling urine.  Otherwise denies any other obvious injuries elsewhere.  Daughter at bedside notes that he does not quite see himself.  He looks weaker than normal.  She states that they have also not been giving his Keppra when she talked with the nurse today.  Reviewed most recent chart notes.     Physical Exam   Triage Vital Signs: ED Triage Vitals  Encounter Vitals Group     BP 01/25/23 0758 (!) 141/78     Systolic BP Percentile --      Diastolic BP Percentile --      Pulse Rate 01/25/23 0758 93     Resp 01/25/23 0758 18     Temp 01/25/23 0758 98.6 F (37 C)     Temp src --      SpO2 01/25/23 0758 95 %     Weight 01/25/23 0756 198 lb 6.6 oz (90 kg)     Height 01/25/23 0756 6\' 2"  (1.88 m)     Head Circumference --      Peak Flow --      Pain Score --      Pain Loc --      Pain Education --      Exclude from Growth Chart --     Most recent vital signs: Vitals:   01/25/23 0758 01/25/23 1203  BP: (!) 141/78 (!) 142/73  Pulse: 93 88  Resp: 18 20  Temp: 98.6 F (37 C) 97.9 F (36.6 C)  SpO2: 95% 100%   Physical Exam: I have reviewed the vital signs and nursing notes. General: Awake, alert, no acute distress.  Fatigued appearing. Head:  Atraumatic, normocephalic.   ENT:  EOM intact, PERRL. Oral mucosa is pink and moist with no lesions. Neck: Neck is supple with full range of motion, No meningeal signs. Cardiovascular:  RRR, No murmurs. Peripheral pulses palpable and equal bilaterally. Respiratory:   Symmetrical chest wall expansion.  No rhonchi, rales, or wheezes.  Good air movement throughout.  No use of accessory muscles.   Musculoskeletal:  No cyanosis or edema. Moving extremities with full ROM Abdomen:  Soft, nontender, nondistended. Neuro:  GCS 14 for confusion which is near his baseline, moving all four extremities, interacting appropriately. Speech clear. Psych:  Calm, appropriate.   Skin:  Warm, dry, no rash.    ED Results / Procedures / Treatments   Labs (all labs ordered are listed, but only abnormal results are displayed) Labs Reviewed  CBC - Abnormal; Notable for the following components:      Result Value   Hemoglobin 12.9 (*)    HCT 38.4 (*)    All other components within normal limits  BASIC METABOLIC PANEL - Abnormal; Notable for the following components:   Potassium 2.8 (*)    Glucose, Bld 106 (*)    All other components within normal limits  URINALYSIS, ROUTINE W REFLEX MICROSCOPIC - Abnormal; Notable for the following components:   Color, Urine YELLOW (*)  APPearance CLEAR (*)    Ketones, ur 5 (*)    All other components within normal limits     EKG My EKG interpretation: Rate of 100, sinus rhythm with occasional PAC.  Normal axis, normal intervals.  No acute ST elevations or depressions   RADIOLOGY Independently interpreted CT head and cervical spine with no acute pathology   PROCEDURES:  Critical Care performed: No  Procedures   MEDICATIONS ORDERED IN ED: Medications  levETIRAcetam (KEPPRA) tablet 1,000 mg (1,000 mg Oral Given 01/25/23 1118)  potassium chloride SA (KLOR-CON M) CR tablet 40 mEq (40 mEq Oral Given 01/25/23 1120)     IMPRESSION / MDM / ASSESSMENT AND PLAN / ED COURSE  I reviewed the triage vital signs and the nursing notes.                              Differential diagnosis includes, but is not limited to, UTI, ICH, cervical spine injury, worsening dementia, electrolyte abnormality  Patient's presentation is most  consistent with acute complicated illness / injury requiring diagnostic workup.  Patient is an 88 year old male with dementia presenting today for reported fall and questionable seizure.  This was not a witnessed fall and no seizure activity was actually seen by staff.  Patient does not appear confused on arrival but does appear weak.  Vital signs are stable.  Will get CT imaging to evaluate for any acute injuries to the head or neck as well as basic laboratory workup and UA.  CT head and cervical spine unremarkable without any acute traumatic injuries.  Laboratory workup notable for mild hypokalemia 2.8 which patient was given oral repletion for.  UA with no evidence of UTI.  Patient was also given dose of his home Keppra as it has been reported he has missed it for the past 5 days.  Reevaluated patient, does appear more alert and conversant with daughter in the room.  She feels he is much closer to his baseline at this time.  Patient potentially could have had a seizure earlier today that was unwitnessed but now appears back at his baseline.  No indication for admission at this time.  Safe for discharge back to facility and given strict return precautions.  Sent with prescriptions for potassium repletion and his home Keppra.  The patient is on the cardiac monitor to evaluate for evidence of arrhythmia and/or significant heart rate changes. Clinical Course as of 01/25/23 1223  Sat Jan 25, 2023  1214 Urinalysis, Routine w reflex microscopic -Urine, Clean Catch(!) No UTI [DW]    Clinical Course User Index [DW] Janith Lima, MD     FINAL CLINICAL IMPRESSION(S) / ED DIAGNOSES   Final diagnoses:  Fall, initial encounter  Hypokalemia     Rx / DC Orders   ED Discharge Orders          Ordered    levETIRAcetam (KEPPRA XR) 500 MG 24 hr tablet  2 times daily        01/25/23 1222    potassium chloride SA (KLOR-CON M) 20 MEQ tablet  2 times daily        01/25/23 1222             Note:   This document was prepared using Dragon voice recognition software and may include unintentional dictation errors.   Janith Lima, MD 01/25/23 1224

## 2023-02-25 NOTE — Progress Notes (Signed)
 New Patient Visit    Chief Complaint: Abnormal cardiac monitor, pedal edema  Date of Service: 02/25/2023 Date of Birth: [birth year] PCP: Merilee Eleanor LABOR, PA  History of Present Illness: William Mills is a 89 y.o.male patient who presented to our clinic to discuss abnormal cardiac monitor results and pedal edema.  Past medical history significant for hypertension, prior stroke, seizures, hyperlipidemia, dementia, diabetes.  Today patient is accompanied by William Mills to visit.  He lives in assisted living.  Patient cannot provide much history secondary to dementia, history mostly obtained from William Mills.  He is dependent on ADLs, do not know the year/month and place where he lives.  Patient wore a Zio patch cardiac monitor and they would like to discuss the results.  Monitor results are not available to me but recent PCP note detailed tachycardia runs occurred the run with fastest interval lasting 17 beats with a maximum rate of 226 bpm, the longest lasting episode for 25 seconds with average rate of 125 bpm.  No atrial fibrillation reported.  Echocardiogram 10/2022 with normal biventricular systolic function, LVEF 60 to 34%, no significant valvular abnormality.  Today patient has no complaints of chest pain, shortness of breath, dizziness or syncope.   Past Medical and Surgical History  Past Medical History No past medical history on file.  Past Surgical History He has no past surgical history on file.   Medications and Allergies  Current Medications Current Outpatient Medications  Medication Sig Dispense Refill  . amLODIPine  (NORVASC ) 10 MG tablet Take 10 mg by mouth once daily    . aspirin  81 MG EC tablet Take 81 mg by mouth every morning    . atorvastatin  (LIPITOR) 20 MG tablet Take 20 mg by mouth once daily    . COMBIGAN  0.2-0.5 % ophthalmic solution Apply 1 drop to eye    . ergocalciferol, vitamin D2, 1,250 mcg (50,000 unit) capsule Take 50,000 Units by mouth once a week    .  hydroCHLOROthiazide  (HYDRODIURIL ) 12.5 MG tablet Take 12.5 mg by mouth once daily    . irbesartan -hydroCHLOROthiazide  (AVALIDE) 150-12.5 mg tablet Take 1 tablet by mouth once daily    . levETIRAcetam  (KEPPRA  XR) 500 mg XR tablet Take 1,000 mg by mouth 2 (two) times daily    . metFORMIN (GLUCOPHAGE-XR) 500 MG XR tablet Take 500 mg by mouth daily with dinner     No current facility-administered medications for this visit.    Allergies Nifedipine , Carbamazepine, Donepezil , Penicillins, and Phenytoin  Social and Family History  Social History  reports that he has an unknown smoking status. He has quit using smokeless tobacco. He reports that he does not drink alcohol.  Family History family history is not on file.   Review of Systems   Review of Systems: The patient denies chest pain, shortness of breath, orthopnea, paroxysmal nocturnal dyspnea, pedal edema, palpitations, heart racing, presyncope, syncope.    Physical Examination   Vitals:BP 132/78 (BP Location: Left upper arm, Patient Position: Sitting, BP Cuff Size: Adult)   Pulse 81   Resp 16   Ht 188 cm (6' 2)   Wt 81.2 kg (179 lb)   SpO2 98%   BMI 22.98 kg/m  Ht:188 cm (6' 2) Wt:81.2 kg (179 lb) ADJ:Anib surface area is 2.06 meters squared. Body mass index is 22.98 kg/m.  HEENT: Pupils equally reactive to light and accomodation  Neck: Supple, no significant JVD Lungs: clear to auscultation bilaterally; no wheezes, rales, rhonchi Heart: Regular rate and rhythm. No murmur  Extremities: +1 pedal edema  Assessment and Plan   89 y.o. male with Supraventricular tachycardia Hypertension Pedal edema History of prior stroke, seizures Dementia, dependent on ADLs  Monitor results not available to me but based on recent note episodes of supraventricular tachycardia noted as detailed in history.  Patient not symptomatic.  Recent echocardiogram with normal biventricular size and systolic function and no significant valvular  abnormality.  I discussed with patient Mills with asymptomatic nonsustained runs of SVT, no further management is indicated at this time.  Will try to obtain Zio patch official result  Regarding pedal edema, currently symptoms have improved-only has mild pedal edema on exam today. Blood pressure well-controlled.  Continue current antihypertensives which also include hydrochlorothiazide  which has helped with William pedal edema.  Orders Placed This Encounter  Procedures  . ECG 12-lead    Return if symptoms worsen or fail to improve.  KRISHNA CHAITANYA ALLURI, MD  This dictation was prepared with dragon dictation. Any transcription errors that result from this process are unintentional.

## 2023-02-28 ENCOUNTER — Ambulatory Visit: Payer: Medicare Other

## 2023-02-28 DIAGNOSIS — M2141 Flat foot [pes planus] (acquired), right foot: Secondary | ICD-10-CM

## 2023-02-28 DIAGNOSIS — E11628 Type 2 diabetes mellitus with other skin complications: Secondary | ICD-10-CM

## 2023-03-24 ENCOUNTER — Ambulatory Visit: Payer: Medicare Other | Admitting: Podiatry

## 2023-03-24 NOTE — Progress Notes (Signed)
William Mills presents to the office today for diabetic shoe and insole measuring.  William Mills was measured with brannock device to determine size and width for 1 pair of extra depth shoes and foam casted for 3 pair of insoles.   Documentation of medical necessity will be sent to William Mills's treating diabetic doctor to verify and sign.   William Mills's diabetic provider: Hiumanshu Paliwal MD   Shoes and insoles will be ordered at that time and William Mills will be notified for an appointment for fitting when they arrive.   Brannock measurement: 13 Shoe choice:   [id number] / [id number] Shoe size ordered: 13WD  NEED TO HAVE FINANCIALS SIGNED

## 2023-04-15 ENCOUNTER — Telehealth: Payer: Self-pay

## 2023-04-15 NOTE — Telephone Encounter (Signed)
 Left pt a VM to let them know about their diabetic shoes, the dr is refusing to sign the paperwork. Himanshu Paiwal - fax number [phone] & [phone]

## 2023-04-16 ENCOUNTER — Telehealth: Payer: Self-pay

## 2023-04-16 NOTE — Telephone Encounter (Signed)
 Spoke with his daughter, she informed me that they have switched doctors. And she will be calling them for me and getting name and fax number

## 2023-04-22 ENCOUNTER — Ambulatory Visit: Payer: Medicare Other | Admitting: Podiatry

## 2023-05-04 ENCOUNTER — Other Ambulatory Visit: Payer: Self-pay

## 2023-05-04 ENCOUNTER — Emergency Department
Admission: EM | Admit: 2023-05-04 | Discharge: 2023-05-04 | Disposition: A | Discharge status: Home / Self Care | Attending: Emergency Medicine | Admitting: Emergency Medicine

## 2023-05-04 DIAGNOSIS — Z8673 Personal history of transient ischemic attack (TIA), and cerebral infarction without residual deficits: Secondary | ICD-10-CM | POA: Diagnosis not present

## 2023-05-04 DIAGNOSIS — R61 Generalized hyperhidrosis: Secondary | ICD-10-CM | POA: Diagnosis present

## 2023-05-04 DIAGNOSIS — F0390 Unspecified dementia without behavioral disturbance: Secondary | ICD-10-CM | POA: Diagnosis not present

## 2023-05-04 DIAGNOSIS — E1165 Type 2 diabetes mellitus with hyperglycemia: Secondary | ICD-10-CM | POA: Insufficient documentation

## 2023-05-04 DIAGNOSIS — R4182 Altered mental status, unspecified: Secondary | ICD-10-CM | POA: Diagnosis not present

## 2023-05-04 DIAGNOSIS — I10 Essential (primary) hypertension: Secondary | ICD-10-CM | POA: Diagnosis not present

## 2023-05-04 LAB — COMPREHENSIVE METABOLIC PANEL
ALT: 9 U/L (ref 0–44)
AST: 14 U/L — ABNORMAL LOW (ref 15–41)
Albumin: 3.3 g/dL — ABNORMAL LOW (ref 3.5–5.0)
Alkaline Phosphatase: 51 U/L (ref 38–126)
Anion gap: 4 — ABNORMAL LOW (ref 5–15)
BUN: 22 mg/dL (ref 8–23)
CO2: 29 mmol/L (ref 22–32)
Calcium: 8.5 mg/dL — ABNORMAL LOW (ref 8.9–10.3)
Chloride: 105 mmol/L (ref 98–111)
Creatinine, Ser: 0.92 mg/dL (ref 0.61–1.24)
GFR, Estimated: >60 mL/min (ref >60)
Glucose, Bld: 118 mg/dL — ABNORMAL HIGH (ref 70–99)
Potassium: 3.3 mmol/L — ABNORMAL LOW (ref 3.5–5.1)
Sodium: 138 mmol/L (ref 135–145)
Total Bilirubin: 1 mg/dL (ref 0.0–1.2)
Total Protein: 6.3 g/dL — ABNORMAL LOW (ref 6.5–8.1)

## 2023-05-04 LAB — URINALYSIS, COMPLETE (UACMP) WITH MICROSCOPIC
Bacteria, UA: NONE SEEN
Bilirubin Urine: NEGATIVE
Glucose, UA: NEGATIVE mg/dL
Hgb urine dipstick: NEGATIVE
Ketones, ur: NEGATIVE mg/dL
Leukocytes,Ua: NEGATIVE
Nitrite: NEGATIVE
Protein, ur: NEGATIVE mg/dL
Specific Gravity, Urine: 1.02 (ref 1.005–1.030)
Squamous Epithelial / HPF: 0 /HPF (ref 0–5)
pH: 5 (ref 5.0–8.0)

## 2023-05-04 LAB — TROPONIN I (HIGH SENSITIVITY)
Troponin I (High Sensitivity): 9 ng/L (ref <18)
Troponin I (High Sensitivity): 6 ng/L (ref <18)

## 2023-05-04 LAB — CBC
HCT: 39.8 % (ref 39.0–52.0)
Hemoglobin: 13.3 g/dL (ref 13.0–17.0)
MCH: 29 pg (ref 26.0–34.0)
MCHC: 33.4 g/dL (ref 30.0–36.0)
MCV: 86.9 fL (ref 80.0–100.0)
Platelets: 195 10*3/uL (ref 150–400)
RBC: 4.58 MIL/uL (ref 4.22–5.81)
RDW: 13.9 % (ref 11.5–15.5)
WBC: 5.2 10*3/uL (ref 4.0–10.5)
nRBC: 0 % (ref 0.0–0.2)

## 2023-05-04 LAB — CBG MONITORING, ED: Glucose-Capillary: 131 mg/dL — ABNORMAL HIGH (ref 70–99)

## 2023-05-04 MED ORDER — SODIUM CHLORIDE 0.9 % IV BOLUS
500.0000 mL | Freq: Once | INTRAVENOUS | Status: AC
  Administered 2023-05-04: 500 mL via INTRAVENOUS

## 2023-05-04 NOTE — Discharge Instructions (Signed)
 You have been seen in the emergency department for medical complaint.  Your workup as shown reassuring results including lab work and an EKG.  Please continue to drink plenty of fluids at home.  Return to the emergency department for any chest pain shortness of breath or any other symptom personally concerning to yourself or staff member.

## 2023-05-04 NOTE — ED Provider Notes (Signed)
 [facility] Provider Note    Event Date/Time   First MD Initiated Contact with Patient 05/04/23 [phone]     (approximate)  History   Chief Complaint: Altered Mental Status  HPI  William Mills is a 89 y.o. male with a past medical history of prior CVA, dementia, hypertension, hyperlipidemia, seizure disorder, diabetes, presents to the emergency department for altered mental status.  According to EMS report patient is coming from [facility] care unit, patient was noted to be diaphoretic this morning.  Patient has a history of diabetes per report and the patient was given orange juice by staff members.  States his mental status improved and the sweating stopped.  Staff did not check a CBG prior to orange juice administration.  EMS states upon their arrival patient had a CBG of 105.  Patient is awake alert he is oriented to person, but with a baseline history of dementia.  Has no complaints calm and cooperative.  Physical Exam   Triage Vital Signs: ED Triage Vitals  Encounter Vitals Group     BP 05/04/23 0738 116/74     Systolic BP Percentile --      Diastolic BP Percentile --      Pulse Rate 05/04/23 0738 76     Resp 05/04/23 0738 17     Temp 05/04/23 0738 97.6 F (36.4 C)     Temp Source 05/04/23 0738 Oral     SpO2 05/04/23 0738 100 %     Weight --      Height --      Head Circumference --      Peak Flow --      Pain Score 05/04/23 0736 0     Pain Loc --      Pain Education --      Exclude from Growth Chart --     Most recent vital signs: Vitals:   05/04/23 0738  BP: 116/74  Pulse: 76  Resp: 17  Temp: 97.6 F (36.4 C)  SpO2: 100%    General: Awake, no distress.  CV:  Good peripheral perfusion.  Regular rate and rhythm  Resp:  Normal effort.  Equal breath sounds bilaterally.  Abd:  No distention.  Soft, nontender.  No rebound or guarding. Other:  No diaphoresis.   ED Results / Procedures / Treatments   EKG  EKG viewed and  interpreted by myself shows a sinus rhythm at 74 bpm with a narrow QRS, normal axis, normal intervals, no concerning ST changes.   MEDICATIONS ORDERED IN ED: Medications - No data to display   IMPRESSION / MDM / ASSESSMENT AND PLAN / ED COURSE  I reviewed the triage vital signs and the nursing notes.  Patient's presentation is most consistent with acute presentation with potential threat to life or bodily function.  Patient presents to the emergency department for diaphoresis this morning.  Overall the patient appears well, no distress.  EMS reports staff states symptoms improved with or juice administration possibly indicating hypoglycemia this morning.  However is no CBG was obtained prior to or issues we will obtain more of a workup including labs, EKG and cardiac enzyme.  Will IV hydrate continue to closely monitor while awaiting results.  Patient agreeable to plan.  Patient's workup is reassuring, CBC is normal, fingerstick is 131.  Chemistry shows 118 on the glucose reassuring chemistry otherwise.  Troponin negative x 2.  Urinalysis normal.  Family is now here with the patient.  Patient continues  to act at his baseline, no complaints.  Given the patient's reassuring workup I believe the patient is safe for discharge home with outpatient follow-up.  FINAL CLINICAL IMPRESSION(S) / ED DIAGNOSES   Diaphoresis  Note:  This document was prepared using Dragon voice recognition software and may include unintentional dictation errors.   Minna Antis, MD 05/04/23 1227

## 2023-05-04 NOTE — ED Notes (Signed)
 Pt going back to facility with daughter, Wylene Simmer.

## 2023-05-04 NOTE — ED Triage Notes (Signed)
 Coming from [facility]. Patient was sweating and altered this morning. Hx of diabetes. Staff gave orange juice this morning and his mental status came back. CBG: 105 with EMS.

## 2023-05-04 NOTE — ED Notes (Signed)
 This RN attempted to call [facility] to give report and no one answered.

## 2023-05-04 NOTE — ED Notes (Addendum)
 Pt cleaned up after a bowel movement. New brief and bed pad applied. Warm blanket given.

## 2023-09-24 ENCOUNTER — Emergency Department

## 2023-09-24 ENCOUNTER — Emergency Department
Admission: EM | Admit: 2023-09-24 | Discharge: 2023-09-24 | Disposition: A | Discharge status: Home / Self Care | Attending: Emergency Medicine | Admitting: Emergency Medicine

## 2023-09-24 ENCOUNTER — Other Ambulatory Visit: Payer: Self-pay

## 2023-09-24 DIAGNOSIS — I10 Essential (primary) hypertension: Secondary | ICD-10-CM | POA: Diagnosis not present

## 2023-09-24 DIAGNOSIS — R339 Retention of urine, unspecified: Secondary | ICD-10-CM | POA: Diagnosis not present

## 2023-09-24 DIAGNOSIS — R410 Disorientation, unspecified: Secondary | ICD-10-CM | POA: Diagnosis present

## 2023-09-24 DIAGNOSIS — F0390 Unspecified dementia without behavioral disturbance: Secondary | ICD-10-CM | POA: Diagnosis not present

## 2023-09-24 DIAGNOSIS — Y92009 Unspecified place in unspecified non-institutional (private) residence as the place of occurrence of the external cause: Secondary | ICD-10-CM | POA: Insufficient documentation

## 2023-09-24 DIAGNOSIS — W19XXXA Unspecified fall, initial encounter: Secondary | ICD-10-CM | POA: Insufficient documentation

## 2023-09-24 LAB — CBC WITH DIFFERENTIAL/PLATELET
Abs Immature Granulocytes: 0.01 K/uL (ref 0.00–0.07)
Basophils Absolute: 0 K/uL (ref 0.0–0.1)
Basophils Relative: 1 %
Eosinophils Absolute: 0.1 K/uL (ref 0.0–0.5)
Eosinophils Relative: 3 %
HCT: 41.1 % (ref 39.0–52.0)
Hemoglobin: 13.8 g/dL (ref 13.0–17.0)
Immature Granulocytes: 0 %
Lymphocytes Relative: 43 %
Lymphs Abs: 2 K/uL (ref 0.7–4.0)
MCH: 29 pg (ref 26.0–34.0)
MCHC: 33.6 g/dL (ref 30.0–36.0)
MCV: 86.3 fL (ref 80.0–100.0)
Monocytes Absolute: 0.5 K/uL (ref 0.1–1.0)
Monocytes Relative: 10 %
Neutro Abs: 2.1 K/uL (ref 1.7–7.7)
Neutrophils Relative %: 43 %
Platelets: 224 K/uL (ref 150–400)
RBC: 4.76 MIL/uL (ref 4.22–5.81)
RDW: 13.9 % (ref 11.5–15.5)
WBC: 4.7 K/uL (ref 4.0–10.5)
nRBC: 0 % (ref 0.0–0.2)

## 2023-09-24 LAB — URINALYSIS, W/ REFLEX TO CULTURE (INFECTION SUSPECTED)
Bilirubin Urine: NEGATIVE
Glucose, UA: NEGATIVE mg/dL
Hgb urine dipstick: NEGATIVE
Ketones, ur: NEGATIVE mg/dL
Nitrite: NEGATIVE
Protein, ur: NEGATIVE mg/dL
Specific Gravity, Urine: 1.019 (ref 1.005–1.030)
pH: 5 (ref 5.0–8.0)

## 2023-09-24 LAB — COMPREHENSIVE METABOLIC PANEL WITH GFR
ALT: 10 U/L (ref 0–44)
AST: 16 U/L (ref 15–41)
Albumin: 3.9 g/dL (ref 3.5–5.0)
Alkaline Phosphatase: 53 U/L (ref 38–126)
Anion gap: 8 (ref 5–15)
BUN: 19 mg/dL (ref 8–23)
CO2: 25 mmol/L (ref 22–32)
Calcium: 9.1 mg/dL (ref 8.9–10.3)
Chloride: 106 mmol/L (ref 98–111)
Creatinine, Ser: 0.71 mg/dL (ref 0.61–1.24)
GFR, Estimated: >60 mL/min (ref >60)
Glucose, Bld: 89 mg/dL (ref 70–99)
Potassium: 3.7 mmol/L (ref 3.5–5.1)
Sodium: 139 mmol/L (ref 135–145)
Total Bilirubin: 1.1 mg/dL (ref 0.0–1.2)
Total Protein: 6.9 g/dL (ref 6.5–8.1)

## 2023-09-24 MED ORDER — SODIUM CHLORIDE 0.9 % IV BOLUS
1000.0000 mL | Freq: Once | INTRAVENOUS | Status: AC
  Administered 2023-09-24: 1000 mL via INTRAVENOUS

## 2023-09-24 NOTE — ED Triage Notes (Signed)
 Arrives via EMS for fall, unwitnessed last night came from [facility]  Unk LOC C/o urinary retention x2 days, not Pt normal  Per [facility] slower to respond Pt has dementia  EMS vitals: HR 74, 98% RA, 168/79, CBG 105 EMS interventions: EMS Assessment: Confused, odor to urine  PtHx: Dementia  MedHx: No blood thinners

## 2023-09-24 NOTE — ED Notes (Signed)
 Fall precautions in place for Pt. This RN placed fall band, fall grip socks, bed alarm and fall sign.

## 2023-09-24 NOTE — ED Notes (Signed)
 Patient picked up by facility [facility]

## 2023-09-24 NOTE — ED Notes (Signed)
 Lunch tray ordered for this Pt.

## 2023-09-24 NOTE — ED Notes (Signed)
  house called and given handoff, they do provide transportation told this RN their transportation service is at an appointment and he would be here all day if he needed a ride back. This RN stated if they had a transportation service, they would need to come back. Told this RN they would call back. Special educational needs teacher notified.

## 2023-09-24 NOTE — ED Provider Notes (Signed)
 [facility] Provider Note    Event Date/Time   First MD Initiated Contact with Patient 09/24/23 [phone]     (approximate)   History   Chief Complaint: Fall   HPI  William Mills is a 89 y.o. male with a history of hypertension hyperlipidemia dementia seizure disorder who was brought to the ED due to an unwitnessed fall at his skilled nursing facility last night.  Staff report patient seems more confused today.  Patient denies any pain.        Past Medical History:  Diagnosis Date   Cerebrovascular accident [facility])    Dementia (HCC)    Head injury 1970   right frontal   Hyperlipidemia    Hypertension    Seizure disorder [facility])     Current Outpatient Rx   Order #: [id number] Class: Historical Med   Order #: [id number] Class: Normal   Order #: [id number] Class: Historical Med   Order #: [id number] Class: Phone In   Order #: [id number] Class: Phone In   Order #: [id number] Class: Historical Med   Order #: [id number] Class: Normal   Order #: [id number] Class: Normal   Order #: [id number] Class: Print   Order #: [id number] Class: Historical Med   Order #: [id number] Class: Print    Past Surgical History:  Procedure Laterality Date   release of right undescended testicle      Physical Exam   Triage Vital Signs: ED Triage Vitals [09/24/23 0841]  Encounter Vitals Group     BP      Girls Systolic BP Percentile      Girls Diastolic BP Percentile      Boys Systolic BP Percentile      Boys Diastolic BP Percentile      Pulse      Resp      Temp      Temp src      SpO2      Weight 176 lb 12.8 oz (80.2 kg)     Height 6' 2 (1.88 m)     Head Circumference      Peak Flow      Pain Score      Pain Loc      Pain Education      Exclude from Growth Chart     Most recent vital signs: Vitals:   09/24/23 0845 09/24/23 0851  BP:  (!) 157/72  Pulse:  72  Resp:  (!) 22  Temp: 98.4 F (36.9 C)   SpO2:  100%    General: Awake, no distress.  CV:  Good  peripheral perfusion.  Regular rate rhythm Resp:  Normal effort.  Clear to auscultation Abd:  No distention.  Soft nontender Other:  Head atraumatic.  No midline spinal tenderness.  Full range of motion all extremities, long bones stable without deformities.   ED Results / Procedures / Treatments   Labs (all labs ordered are listed, but only abnormal results are displayed) Labs Reviewed  URINALYSIS, W/ REFLEX TO CULTURE (INFECTION SUSPECTED) - Abnormal; Notable for the following components:      Result Value   Color, Urine YELLOW (*)    APPearance HAZY (*)    Leukocytes,Ua TRACE (*)    Bacteria, UA RARE (*)    All other components within normal limits  COMPREHENSIVE METABOLIC PANEL WITH GFR  CBC WITH DIFFERENTIAL/PLATELET     EKG Interpreted by me Sinus rhythm rate of 75.  Normal axis intervals QRS ST segments T waves.  No ischemic changes.   RADIOLOGY CT head  interpreted by me, negative for intracranial hemorrhage.  Radiology report reviewed   PROCEDURES:  Procedures   MEDICATIONS ORDERED IN ED: Medications  sodium chloride  0.9 % bolus 1,000 mL (0 mLs Intravenous Stopped 09/24/23 1052)     IMPRESSION / MDM / ASSESSMENT AND PLAN / ED COURSE  I reviewed the triage vital signs and the nursing notes.  DDx: Dehydration, electrolyte derangement, anemia, mechanical fall, intracranial hemorrhage, UTI, concussion, fatigue  Patient's presentation is most consistent with acute presentation with potential threat to life or bodily function.  Patient presents with unwitnessed fall at his nursing facility.  Symptoms and exam nonfocal.  EKG and serum labs are normal.  Will check UA and CT head.   ----------------------------------------- 12:27 PM on 09/24/2023 ----------------------------------------- Labs and CT head unremarkable.  Discussed with patient's 3 family members at bedside.  They do note that he used to get physical therapy, but that was recently discontinued due to  insurance company denial.  I do think that continued physical therapy will be important for maintaining balance and stability and without this he is likely to continue physical deconditioning and decline.  Doubt acute stroke, meningitis, or any other new cardiopulmonary or neurologic event.  Stable for discharge and primary care follow-up.      FINAL CLINICAL IMPRESSION(S) / ED DIAGNOSES   Final diagnoses:  Fall in home, initial encounter  Chronic dementia (HCC)     Rx / DC Orders   ED Discharge Orders     None        Note:  This document was prepared using Dragon voice recognition software and may include unintentional dictation errors.   Viviann Pastor, MD 09/24/23 1229

## 2023-09-28 ENCOUNTER — Emergency Department

## 2023-09-28 ENCOUNTER — Observation Stay
Admission: EM | Admit: 2023-09-28 | Discharge: 2023-10-02 | Disposition: A | Discharge status: Skilled Nursing Facility | Attending: Student | Admitting: Student

## 2023-09-28 ENCOUNTER — Encounter: Payer: Self-pay | Admitting: *Deleted

## 2023-09-28 ENCOUNTER — Other Ambulatory Visit: Payer: Self-pay

## 2023-09-28 DIAGNOSIS — I110 Hypertensive heart disease with heart failure: Secondary | ICD-10-CM | POA: Insufficient documentation

## 2023-09-28 DIAGNOSIS — F0390 Unspecified dementia without behavioral disturbance: Secondary | ICD-10-CM | POA: Diagnosis not present

## 2023-09-28 DIAGNOSIS — E785 Hyperlipidemia, unspecified: Secondary | ICD-10-CM | POA: Diagnosis present

## 2023-09-28 DIAGNOSIS — E119 Type 2 diabetes mellitus without complications: Secondary | ICD-10-CM | POA: Insufficient documentation

## 2023-09-28 DIAGNOSIS — E118 Type 2 diabetes mellitus with unspecified complications: Secondary | ICD-10-CM | POA: Diagnosis present

## 2023-09-28 DIAGNOSIS — R569 Unspecified convulsions: Principal | ICD-10-CM | POA: Insufficient documentation

## 2023-09-28 DIAGNOSIS — G8191 Hemiplegia, unspecified affecting right dominant side: Secondary | ICD-10-CM | POA: Diagnosis present

## 2023-09-28 DIAGNOSIS — R296 Repeated falls: Secondary | ICD-10-CM | POA: Insufficient documentation

## 2023-09-28 DIAGNOSIS — R4182 Altered mental status, unspecified: Secondary | ICD-10-CM | POA: Diagnosis not present

## 2023-09-28 DIAGNOSIS — Z8673 Personal history of transient ischemic attack (TIA), and cerebral infarction without residual deficits: Secondary | ICD-10-CM | POA: Insufficient documentation

## 2023-09-28 DIAGNOSIS — E876 Hypokalemia: Secondary | ICD-10-CM | POA: Insufficient documentation

## 2023-09-28 DIAGNOSIS — R531 Weakness: Secondary | ICD-10-CM

## 2023-09-28 DIAGNOSIS — I5032 Chronic diastolic (congestive) heart failure: Secondary | ICD-10-CM | POA: Diagnosis present

## 2023-09-28 DIAGNOSIS — R600 Localized edema: Secondary | ICD-10-CM | POA: Insufficient documentation

## 2023-09-28 DIAGNOSIS — I10 Essential (primary) hypertension: Secondary | ICD-10-CM | POA: Diagnosis present

## 2023-09-28 DIAGNOSIS — R270 Ataxia, unspecified: Principal | ICD-10-CM

## 2023-09-28 DIAGNOSIS — I639 Cerebral infarction, unspecified: Secondary | ICD-10-CM | POA: Diagnosis present

## 2023-09-28 LAB — CBC
HCT: 37.3 % — ABNORMAL LOW (ref 39.0–52.0)
Hemoglobin: 12.6 g/dL — ABNORMAL LOW (ref 13.0–17.0)
MCH: 29.2 pg (ref 26.0–34.0)
MCHC: 33.8 g/dL (ref 30.0–36.0)
MCV: 86.5 fL (ref 80.0–100.0)
Platelets: 206 K/uL (ref 150–400)
RBC: 4.31 MIL/uL (ref 4.22–5.81)
RDW: 13.7 % (ref 11.5–15.5)
WBC: 4.3 K/uL (ref 4.0–10.5)
nRBC: 0 % (ref 0.0–0.2)

## 2023-09-28 LAB — COMPREHENSIVE METABOLIC PANEL WITH GFR
ALT: 10 U/L (ref 0–44)
AST: 16 U/L (ref 15–41)
Albumin: 3.4 g/dL — ABNORMAL LOW (ref 3.5–5.0)
Alkaline Phosphatase: 54 U/L (ref 38–126)
Anion gap: 8 (ref 5–15)
BUN: 17 mg/dL (ref 8–23)
CO2: 28 mmol/L (ref 22–32)
Calcium: 9.1 mg/dL (ref 8.9–10.3)
Chloride: 104 mmol/L (ref 98–111)
Creatinine, Ser: 0.76 mg/dL (ref 0.61–1.24)
GFR, Estimated: >60 mL/min (ref >60)
Glucose, Bld: 97 mg/dL (ref 70–99)
Potassium: 3.6 mmol/L (ref 3.5–5.1)
Sodium: 140 mmol/L (ref 135–145)
Total Bilirubin: 1.3 mg/dL — ABNORMAL HIGH (ref 0.0–1.2)
Total Protein: 6.2 g/dL — ABNORMAL LOW (ref 6.5–8.1)

## 2023-09-28 LAB — URINALYSIS, ROUTINE W REFLEX MICROSCOPIC
Bilirubin Urine: NEGATIVE
Glucose, UA: NEGATIVE mg/dL
Hgb urine dipstick: NEGATIVE
Ketones, ur: NEGATIVE mg/dL
Leukocytes,Ua: NEGATIVE
Nitrite: NEGATIVE
Protein, ur: NEGATIVE mg/dL
Specific Gravity, Urine: 1.025 (ref 1.005–1.030)
pH: 5 (ref 5.0–8.0)

## 2023-09-28 LAB — BLOOD GAS, VENOUS
Acid-Base Excess: 6.6 mmol/L — ABNORMAL HIGH (ref 0.0–2.0)
Bicarbonate: 32.4 mmol/L — ABNORMAL HIGH (ref 20.0–28.0)
O2 Saturation: 97.1 %
Patient temperature: 37
pCO2, Ven: 50 mmHg (ref 44–60)
pH, Ven: 7.42 (ref 7.25–7.43)
pO2, Ven: 75 mmHg — ABNORMAL HIGH (ref 32–45)

## 2023-09-28 LAB — CBG MONITORING, ED: Glucose-Capillary: 89 mg/dL (ref 70–99)

## 2023-09-28 MED ORDER — IOHEXOL 350 MG/ML SOLN
75.0000 mL | Freq: Once | INTRAVENOUS | Status: AC | PRN
  Administered 2023-09-28: 75 mL via INTRAVENOUS

## 2023-09-28 NOTE — ED Provider Notes (Signed)
 William Mills Provider Note    Event Date/Time   First MD Initiated Contact with Patient 09/28/23 2019     (approximate)   History   Altered Mental Status   HPI  Deborah Dondero is a 89 y.o. male with history of dementia, seizures, diabetes, hyperlipidemia, prior stroke, VP shunt, presenting with altered mental status.  Per family, patient has not been at his mental baseline for the past 2 weeks, they noted that he has been weak, more sleepy than usual.  Has had multiple falls in the past week, last 1 was on Thursday or Friday.  They said that his assisted living did not know any nausea vomiting or diarrhea, no reported fevers or seizures.  He denies any burning with urination or headache.  Daughter states that he has had a prior aneurysm and has a VP shunt in place. Daughter states that she noted that he tends to lean to his left, today was noting that he tends to lean more to his right and was drooling.  Independent history obtained from daughter as above.  She also states that he is legally blind on the left eye.   On independent review, he was seen by cardiology in January of this year, lives at assisted living, cannot provide much history due to dementia, last echo was done in 2024 that showed normal EF.  He was admitted in September 2024 for seizure versus syncope, has large cortical CVA on CT.  Was continued on his Keppra .     Physical Exam   Triage Vital Signs: ED Triage Vitals [09/28/23 1804]  Encounter Vitals Group     BP 132/60     Girls Systolic BP Percentile      Girls Diastolic BP Percentile      Boys Systolic BP Percentile      Boys Diastolic BP Percentile      Pulse Rate 78     Resp 16     Temp (!) 97.5 F (36.4 C)     Temp Source Axillary     SpO2 100 %     Weight      Height      Head Circumference      Peak Flow      Pain Score      Pain Loc      Pain Education      Exclude from Growth Chart     Most recent vital  signs: Vitals:   09/28/23 1804 09/28/23 2230  BP: 132/60 (!) 146/73  Pulse: 78 71  Resp: 16 17  Temp: (!) 97.5 F (36.4 C) 97.9 F (36.6 C)  SpO2: 100% 100%     General: Sleepy but wakes up to voice CV:  Good peripheral perfusion.  Resp:  Normal effort.  No tachypnea or respiratory distress Abd:  No distention.  Soft nontender Other:  No facial droop, no drooling or slurred speech, no obvious focal weakness or numbness.  He is unable to keep his lower extremities elevated against gravity, is able to dorsi and plantarflex equally, no focal weakness to his bilateral upper extremities, no obvious numbness.  He has no midline spinal tenderness, no thoracic cage tenderness, no tenderness to his extremities, he does have bilateral lower extremity edema that appears symmetrical, his left pupil is clouded over, right pupil is reactive, extraocular movements are intact   ED Results / Procedures / Treatments   Labs (all labs ordered are listed, but only abnormal results are displayed)  Labs Reviewed  COMPREHENSIVE METABOLIC PANEL WITH GFR - Abnormal; Notable for the following components:      Result Value   Total Protein 6.2 (*)    Albumin 3.4 (*)    Total Bilirubin 1.3 (*)    All other components within normal limits  CBC - Abnormal; Notable for the following components:   Hemoglobin 12.6 (*)    HCT 37.3 (*)    All other components within normal limits  URINALYSIS, ROUTINE W REFLEX MICROSCOPIC - Abnormal; Notable for the following components:   Color, Urine YELLOW (*)    APPearance CLEAR (*)    All other components within normal limits  BLOOD GAS, VENOUS - Abnormal; Notable for the following components:   pO2, Ven 75 (*)    Bicarbonate 32.4 (*)    Acid-Base Excess 6.6 (*)    All other components within normal limits  CBG MONITORING, ED     RADIOLOGY On my independent interpretation, chest x-ray without obvious consolidation   PROCEDURES:  Critical Care performed:  No  Procedures   MEDICATIONS ORDERED IN ED: Medications  iohexol  (OMNIPAQUE ) 350 MG/ML injection 75 mL (75 mLs Intravenous Contrast Given 09/28/23 2116)     IMPRESSION / MDM / ASSESSMENT AND PLAN / ED COURSE  I reviewed the triage vital signs and the nursing notes.                              Differential diagnosis includes, but is not limited to, infection, electrolyte derangements, no obvious focal deficits to suggest CVA at this time, did consider VP shunt issues, mass, traumatic injury from his fall.  Will get labs, CT head and cervical spine as well as CT angio head and neck.  VP shunt series, UA.  Patient's presentation is most consistent with acute presentation with potential threat to life or bodily function.  Independent interpretation of labs and imaging below.  Patient signed out pending CT angio results, he will likely need to be admitted for further workup of his generalized weakness in altered mental status.  The patient is on the cardiac monitor to evaluate for evidence of arrhythmia and/or significant heart rate changes.   Clinical Course as of 09/28/23 2337  [place] Sep 28, 2023  2155 DG Cervical Spine 1 View IMPRESSION: 1. Negative frontal view C-spine, chest, abdomen pelvis. 2. Right ventriculoperitoneal shunt with no discontinuity or kinking.   [TT]  2155 DG Skull 1-3 Views IMPRESSION: VP shunt catheter is visualized in the right parietal region extending into the left neck. Catheter appears intact.   [TT]  2156 CT Cervical Spine Wo Contrast IMPRESSION: 1. No acute fracture or listhesis of the cervical spine. 2. Degenerative disc and degenerative joint disease resulting in diffuse moderate neuroforaminal narrowing. 3. Mild emphysema.   [TT]  2336 Independent review of labs, he is not retaining carbon dioxide, UA is not consistent with UTI, electrolytes not severely deranged, T. bili is mildly elevated but he has no jaundice or right upper quadrant  abdominal pain as time, no leukocytosis. [TT]    Clinical Course User Index [TT] Waymond Lorelle Cummins, MD     FINAL CLINICAL IMPRESSION(S) / ED DIAGNOSES   Final diagnoses:  Altered mental status, unspecified altered mental status type  Weakness     Rx / DC Orders   ED Discharge Orders     None        Note:  This document was prepared using  Dragon [employer] engineer and may include unintentional dictation errors.    Waymond Lorelle Cummins, MD 09/28/23 [phone]

## 2023-09-28 NOTE — ED Triage Notes (Signed)
 Pt arrives by [facility] from [facility].  Pt has hx of dementia.  Per ems his daughter was concerned about altered level of consciousness and favoring the right side which has been ongoing for one week.  EMS reports that pt has had blood work and a CT scan.  VSS for ems 156/79, HR 74, spo2 100%, RR 16 and CBG 141.

## 2023-09-28 NOTE — ED Provider Notes (Signed)
-----------------------------------------   11:04 PM on 09/28/2023 -----------------------------------------  Assuming care from Dr. Waymond.  In short, William Mills is a 89 y.o. male with a chief complaint of AMS and ambulation difficulties, possible recent (subacute) CVA.  Refer to the original H&P for additional details.  The current plan of care is to follow up imaging and reassess.   Clinical Course as of 09/29/23 0056  [place] Sep 28, 2023  2155 DG Cervical Spine 1 View IMPRESSION: 1. Negative frontal view C-spine, chest, abdomen pelvis. 2. Right ventriculoperitoneal shunt with no discontinuity or kinking.   [TT]  2155 DG Skull 1-3 Views IMPRESSION: VP shunt catheter is visualized in the right parietal region extending into the left neck. Catheter appears intact.   [TT]  2156 CT Cervical Spine Wo Contrast IMPRESSION: 1. No acute fracture or listhesis of the cervical spine. 2. Degenerative disc and degenerative joint disease resulting in diffuse moderate neuroforaminal narrowing. 3. Mild emphysema.   [TT]  2336 Independent review of labs, he is not retaining carbon dioxide, UA is not consistent with UTI, electrolytes not severely deranged, T. bili is mildly elevated but he has no jaundice or right upper quadrant abdominal pain as time, no leukocytosis. [TT]  Mon Sep 29, 2023  0050 CT Angio Head Neck W WO CM I independently viewed and interpreted the patient's CTA head and neck and I can see no obvious sign of aneurysm and certainly no acute bleeding.  Radiologist commented on some chronic changes as well as some disruptions of the circulation but no sign of obvious or emergent abnormality. [CF]  0051 I reassessed the patient and he continues to be responsive but minimally so and his family is at the bedside and said this is very different for him.  They reiterated that he has had a functional decline over the last week including now and inability to ambulate due to balance issues,  falling the opposite direction from the way he used to lean.  Given the constellation of symptoms and the functional decline over the last week which may represent a stroke particularly in the setting of prior CVA, we discussed it and feel he is appropriate for admission for further evaluation for stroke workup and neurology consult.  His daughter was told that he should not get an MRI but she does not know if that was because of the shunt hardware he has in place or because of an allergy.  She is going to try to look through his records to try to identify more information should an MRI be appropriate and necessary as deemed by the neurology team.  I have consulted the hospitalist team for admission. [CF]  [id number] I consulted by phone with the admitting hospitalist, and they will admit the patient - Dr. Hilma. [CF]    Clinical Course User Index [CF] Gordan Huxley, MD [TT] Waymond Lorelle Cummins, MD     Medications  iohexol  (OMNIPAQUE ) 350 MG/ML injection 75 mL (75 mLs Intravenous Contrast Given 09/28/23 2116)     ED Discharge Orders     None      Final diagnoses:  Altered mental status, unspecified altered mental status type  Weakness  Ataxia     Gordan Huxley, MD 09/29/23 [phone]

## 2023-09-28 NOTE — ED Notes (Signed)
 Call to lab for blood draw.  Daughter at bedside who describes right sided weakness and aloc x 1 week.

## 2023-09-28 NOTE — ED Notes (Signed)
 Pt assisted with urinal by this RN. Pt cleaned of urine incontinence. Clean brief placed and placed in hospital gown. Pt repositioned on stretcher. Urine sample collected and sent to lab. Pt's pants placed in belongings bag and given to family at bedside.

## 2023-09-28 NOTE — ED Notes (Signed)
 Introduced self to pt and family. Pt given warm blanket. Call bell in reach for family. Family has no further needs a this time.

## 2023-09-29 ENCOUNTER — Observation Stay

## 2023-09-29 ENCOUNTER — Observation Stay
Admit: 2023-09-29 | Discharge: 2023-09-29 | Disposition: A | Discharge status: Home / Self Care | Attending: Internal Medicine | Admitting: Internal Medicine

## 2023-09-29 ENCOUNTER — Encounter: Payer: Self-pay | Admitting: Internal Medicine

## 2023-09-29 DIAGNOSIS — I5032 Chronic diastolic (congestive) heart failure: Secondary | ICD-10-CM | POA: Diagnosis present

## 2023-09-29 DIAGNOSIS — E119 Type 2 diabetes mellitus without complications: Secondary | ICD-10-CM

## 2023-09-29 DIAGNOSIS — R569 Unspecified convulsions: Principal | ICD-10-CM

## 2023-09-29 DIAGNOSIS — E118 Type 2 diabetes mellitus with unspecified complications: Secondary | ICD-10-CM | POA: Diagnosis present

## 2023-09-29 DIAGNOSIS — I10 Essential (primary) hypertension: Secondary | ICD-10-CM

## 2023-09-29 DIAGNOSIS — I639 Cerebral infarction, unspecified: Secondary | ICD-10-CM | POA: Diagnosis present

## 2023-09-29 DIAGNOSIS — R531 Weakness: Secondary | ICD-10-CM | POA: Diagnosis not present

## 2023-09-29 DIAGNOSIS — F0390 Unspecified dementia without behavioral disturbance: Secondary | ICD-10-CM

## 2023-09-29 DIAGNOSIS — R296 Repeated falls: Secondary | ICD-10-CM | POA: Diagnosis not present

## 2023-09-29 DIAGNOSIS — E785 Hyperlipidemia, unspecified: Secondary | ICD-10-CM

## 2023-09-29 LAB — ECHOCARDIOGRAM COMPLETE
AR max vel: 4.31 cm2
AV Peak grad: 4.4 mmHg
Ao pk vel: 1.05 m/s
Area-P 1/2: 2.99 cm2
MV M vel: 5.07 m/s
MV Peak grad: 102.8 mmHg
S' Lateral: 3 cm

## 2023-09-29 LAB — GLUCOSE, CAPILLARY
Glucose-Capillary: 95 mg/dL (ref 70–99)
Glucose-Capillary: 135 mg/dL — ABNORMAL HIGH (ref 70–99)

## 2023-09-29 LAB — BASIC METABOLIC PANEL WITH GFR
Anion gap: 6 (ref 5–15)
BUN: 14 mg/dL (ref 8–23)
CO2: 29 mmol/L (ref 22–32)
Calcium: 9 mg/dL (ref 8.9–10.3)
Chloride: 107 mmol/L (ref 98–111)
Creatinine, Ser: 0.7 mg/dL (ref 0.61–1.24)
GFR, Estimated: >60 mL/min
Glucose, Bld: 89 mg/dL (ref 70–99)
Potassium: 3.5 mmol/L (ref 3.5–5.1)
Sodium: 142 mmol/L (ref 135–145)

## 2023-09-29 LAB — LIPID PANEL
Cholesterol: 170 mg/dL (ref 0–200)
HDL: 74 mg/dL
LDL Cholesterol: 90 mg/dL (ref 0–99)
Total CHOL/HDL Ratio: 2.3 ratio
Triglycerides: 30 mg/dL
VLDL: 6 mg/dL (ref 0–40)

## 2023-09-29 LAB — BRAIN NATRIURETIC PEPTIDE: B Natriuretic Peptide: 32.7 pg/mL (ref 0.0–100.0)

## 2023-09-29 LAB — CBG MONITORING, ED
Glucose-Capillary: 92 mg/dL (ref 70–99)
Glucose-Capillary: 86 mg/dL (ref 70–99)
Glucose-Capillary: 83 mg/dL (ref 70–99)

## 2023-09-29 LAB — CBC
HCT: 36.3 % — ABNORMAL LOW (ref 39.0–52.0)
Hemoglobin: 12 g/dL — ABNORMAL LOW (ref 13.0–17.0)
MCH: 28.6 pg (ref 26.0–34.0)
MCHC: 33.1 g/dL (ref 30.0–36.0)
MCV: 86.6 fL (ref 80.0–100.0)
Platelets: 223 K/uL (ref 150–400)
RBC: 4.19 MIL/uL — ABNORMAL LOW (ref 4.22–5.81)
RDW: 13.6 % (ref 11.5–15.5)
WBC: 4.2 K/uL (ref 4.0–10.5)
nRBC: 0 % (ref 0.0–0.2)

## 2023-09-29 MED ORDER — IRBESARTAN-HYDROCHLOROTHIAZIDE 150-12.5 MG PO TABS: 1.0000 | ORAL_TABLET | Freq: Every day | ORAL | Status: DC

## 2023-09-29 MED ORDER — BRIMONIDINE TARTRATE-TIMOLOL 0.2-0.5 % OP SOLN: 1.0000 [drp] | Freq: Two times a day (BID) | OPHTHALMIC | Status: DC

## 2023-09-29 MED ORDER — ONDANSETRON HCL 4 MG/2ML IJ SOLN: 4.0000 mg | Freq: Three times a day (TID) | INTRAMUSCULAR | Status: DC | PRN

## 2023-09-29 MED ORDER — STROKE: EARLY STAGES OF RECOVERY BOOK: Freq: Once | Status: AC

## 2023-09-29 MED ORDER — ACETAMINOPHEN 650 MG RE SUPP: 650.0000 mg | RECTAL | Status: DC | PRN

## 2023-09-29 MED ORDER — ATORVASTATIN CALCIUM 20 MG PO TABS
20.0000 mg | ORAL_TABLET | Freq: Every evening | ORAL | Status: DC
  Administered 2023-09-29 – 2023-10-02 (×7): 20 mg via ORAL
  Filled 2023-09-29 (×4): qty 1

## 2023-09-29 MED ORDER — INSULIN ASPART 100 UNIT/ML IJ SOLN
0.0000 [IU] | Freq: Three times a day (TID) | INTRAMUSCULAR | Status: DC
  Administered 2023-09-30 – 2023-10-02 (×5): 1 [IU] via SUBCUTANEOUS
  Filled 2023-09-29 (×3): qty 1

## 2023-09-29 MED ORDER — TIMOLOL MALEATE 0.5 % OP SOLN
1.0000 [drp] | Freq: Two times a day (BID) | OPHTHALMIC | Status: DC
  Administered 2023-09-29 – 2023-10-02 (×13): 1 [drp] via OPHTHALMIC
  Filled 2023-09-29 (×3): qty 5

## 2023-09-29 MED ORDER — ENOXAPARIN SODIUM 40 MG/0.4ML IJ SOSY
40.0000 mg | PREFILLED_SYRINGE | INTRAMUSCULAR | Status: DC
  Administered 2023-09-29 – 2023-10-02 (×7): 40 mg via SUBCUTANEOUS
  Filled 2023-09-29 (×4): qty 0.4

## 2023-09-29 MED ORDER — ACETAMINOPHEN 325 MG PO TABS: 650.0000 mg | ORAL_TABLET | ORAL | Status: DC | PRN

## 2023-09-29 MED ORDER — AMLODIPINE BESYLATE 10 MG PO TABS
10.0000 mg | ORAL_TABLET | Freq: Every evening | ORAL | Status: DC
  Administered 2023-09-29 – 2023-10-02 (×7): 10 mg via ORAL
  Filled 2023-09-29 (×4): qty 1

## 2023-09-29 MED ORDER — HYDROCHLOROTHIAZIDE 12.5 MG PO TABS
12.5000 mg | ORAL_TABLET | Freq: Every day | ORAL | Status: DC
  Administered 2023-09-30 – 2023-10-01 (×4): 12.5 mg via ORAL
  Filled 2023-09-29 (×2): qty 1

## 2023-09-29 MED ORDER — SODIUM CHLORIDE 0.9 % IV SOLN: INTRAVENOUS | Status: AC

## 2023-09-29 MED ORDER — LORAZEPAM 2 MG/ML IJ SOLN: 2.0000 mg | INTRAMUSCULAR | Status: DC | PRN

## 2023-09-29 MED ORDER — INSULIN ASPART 100 UNIT/ML IJ SOLN: 0.0000 [IU] | Freq: Every day | INTRAMUSCULAR | Status: DC

## 2023-09-29 MED ORDER — IRBESARTAN 150 MG PO TABS
300.0000 mg | ORAL_TABLET | Freq: Every day | ORAL | Status: DC
  Administered 2023-09-30 – 2023-10-02 (×5): 300 mg via ORAL
  Filled 2023-09-29 (×4): qty 2

## 2023-09-29 MED ORDER — ACETAMINOPHEN 160 MG/5ML PO SOLN: 650.0000 mg | ORAL | Status: DC | PRN

## 2023-09-29 MED ORDER — SENNOSIDES-DOCUSATE SODIUM 8.6-50 MG PO TABS: 1.0000 | ORAL_TABLET | Freq: Every evening | ORAL | Status: DC | PRN

## 2023-09-29 MED ORDER — HYDRALAZINE HCL 20 MG/ML IJ SOLN
5.0000 mg | INTRAMUSCULAR | Status: DC | PRN
  Administered 2023-09-30 (×4): 5 mg via INTRAVENOUS
  Filled 2023-09-29 (×2): qty 1

## 2023-09-29 MED ORDER — BRIMONIDINE TARTRATE 0.2 % OP SOLN
1.0000 [drp] | Freq: Two times a day (BID) | OPHTHALMIC | Status: DC
  Administered 2023-09-29 – 2023-10-02 (×13): 1 [drp] via OPHTHALMIC
  Filled 2023-09-29 (×3): qty 5

## 2023-09-29 MED ORDER — LEVETIRACETAM ER 500 MG PO TB24
1000.0000 mg | ORAL_TABLET | Freq: Two times a day (BID) | ORAL | Status: DC
  Administered 2023-09-29 – 2023-10-02 (×11): 1000 mg via ORAL
  Filled 2023-09-29 (×8): qty 2

## 2023-09-29 MED ORDER — LORAZEPAM 2 MG/ML IJ SOLN
2.0000 mg | INTRAMUSCULAR | Status: DC | PRN
  Administered 2023-09-29 (×2): 2 mg via INTRAVENOUS
  Filled 2023-09-29: qty 1

## 2023-09-29 MED ORDER — ASPIRIN 81 MG PO TBEC
81.0000 mg | DELAYED_RELEASE_TABLET | Freq: Every day | ORAL | Status: DC
  Administered 2023-09-30 – 2023-10-02 (×5): 81 mg via ORAL
  Filled 2023-09-29 (×3): qty 1

## 2023-09-29 NOTE — Evaluation (Signed)
 Occupational Therapy Evaluation Patient Details Name: William Mills MRN: [id number] DOB: 1935/01/10 Today's Date: 09/29/2023   History of Present Illness   Pt is a 89 y.o. male who presents with multiple falls, right-sided weakness, confusion. PMH of stroke, s/p of brain aneurysm repair, s/p VP shunt, left eye blindness, seizure, dCHF, dementia     Clinical Impressions Pt was seen for OT evaluation this date. PTA, pt was a resident at [facility] seemingly in memory care. Per notes from 11 months ago, he was living with his daughter who was providing assist for all ADLs and as needed for transfers and mobility. Seems as if pt went to live at ALF since then. He is a poor historian, but reports being in a wheelchair.   Pt presents with deficits in strength, cognition, and balance affecting safe and optimal ADL completion. Pt currently requires Max A x2 for all bed mobility and STS transfers this date with significant posterior lean and bil knee flexion contractures. He was only able to take 2 small side steps to [facility] with Max A x2. BUE weakness noted and unable to follow one step commands at this time. Increased time to express himself during conversation. Pt would benefit from skilled OT services to address noted impairments and functional limitations to maximize safety and independence while minimizing falls risk and caregiver burden. Do not anticipate the need for follow up OT services upon acute hospital DC and pt could likely return to his facility under memory care.      If plan is discharge home, recommend the following:   Two people to help with walking and/or transfers;A lot of help with bathing/dressing/bathroom     Functional Status Assessment   Patient has had a recent decline in their functional status and demonstrates the ability to make significant improvements in function in a reasonable and predictable amount of time.     Equipment Recommendations   Other (comment)  (defer to next venue)     Recommendations for Other Services         Precautions/Restrictions   Precautions Precautions: Fall Recall of Precautions/Restrictions: Impaired Restrictions Weight Bearing Restrictions Per Provider Order: No     Mobility Bed Mobility Overal bed mobility: Needs Assistance Bed Mobility: Supine to Sit, Sit to Supine     Supine to sit: Max assist, +2 for physical assistance, +2 for safety/equipment Sit to supine: Max assist, +2 for safety/equipment, +2 for physical assistance   General bed mobility comments: unable to follow cues/commands to utilize bed rails or assist    Transfers Overall transfer level: Needs assistance Equipment used: 2 person hand held assist Transfers: Sit to/from Stand Sit to Stand: Max assist, +2 physical assistance, +2 safety/equipment           General transfer comment: stood twice from EOB and took a few small, shuffle steps to [facility] with Max A x2 and significant posterior lean and increased time to return to sitting position      Balance Overall balance assessment: Needs assistance Sitting-balance support: Bilateral upper extremity supported, Feet supported Sitting balance-Leahy Scale: Poor     Standing balance support: Bilateral upper extremity supported, Reliant on assistive device for balance Standing balance-Leahy Scale: Poor                             ADL either performed or assessed with clinical judgement   ADL Overall ADL's : Needs assistance/impaired  Lower Body Dressing: Total assistance Lower Body Dressing Details (indicate cue type and reason): anticipate Toilet Transfer: Total assistance;+2 for physical assistance;Maximal assistance Toilet Transfer Details (indicate cue type and reason): anticipate d/t siginifcant posterior lean and assist for standing and lateral steps at bedside                 Vision         Perception         Praxis          Pertinent Vitals/Pain Pain Assessment Pain Assessment: PAINAD Breathing: normal Negative Vocalization: none Facial Expression: smiling or inexpressive Consolability: no need to console Pain Intervention(s): Monitored during session     Extremity/Trunk Assessment Upper Extremity Assessment Upper Extremity Assessment: Generalized weakness   Lower Extremity Assessment Lower Extremity Assessment: Generalized weakness   Cervical / Trunk Assessment Cervical / Trunk Assessment: Kyphotic   Communication Communication Communication: Impaired Factors Affecting Communication: Hearing impaired;Difficulty expressing self (increased time to respond)   Cognition Arousal: Lethargic Behavior During Therapy: Flat affect                                 Following commands: Impaired Following commands impaired: Follows one step commands with increased time, Follows one step commands inconsistently     Cueing  General Comments   Cueing Techniques: Verbal cues;Gestural cues;Tactile cues      Exercises     Shoulder Instructions      Home Living Family/patient expects to be discharged to:: Other (Comment)                                 Additional Comments: Memory Care at [facility]      Prior Functioning/Environment Prior Level of Function : Patient poor historian/Family not available             Mobility Comments: Unsure of baseline due to no family present and patient's cognitive status ADLs Comments: almost a year ago pt was living with his daughter who was assisting with all ADLs and was assisting as needed with transfers; seems pt was transitioned to memory care at [facility] since then    OT Problem List: Decreased strength;Decreased activity tolerance;Decreased cognition;Impaired balance (sitting and/or standing);Decreased safety awareness   OT Treatment/Interventions: Self-care/ADL training;Therapeutic exercise;Therapeutic  activities;Patient/family education;Balance training      OT Goals(Current goals can be found in the care plan section)   Acute Rehab OT Goals OT Goal Formulation: Patient unable to participate in goal setting Time For Goal Achievement: 10/13/23 Potential to Achieve Goals: Poor ADL Goals Pt Will Perform Grooming: with min assist;sitting Pt Will Perform Upper Body Bathing: with mod assist;sitting   OT Frequency:  Min 1X/week    Co-evaluation PT/OT/SLP Co-Evaluation/Treatment: Yes Reason for Co-Treatment: For patient/therapist safety;To address functional/ADL transfers PT goals addressed during session: Mobility/safety with mobility OT goals addressed during session: ADL's and self-care      AM-PAC OT 6 Clicks Daily Activity     Outcome Measure Help from another person eating meals?: A Lot Help from another person taking care of personal grooming?: A Lot Help from another person toileting, which includes using toliet, bedpan, or urinal?: Total Help from another person bathing (including washing, rinsing, drying)?: Total Help from another person to put on and taking off regular upper body clothing?: A Lot Help from another person to put on and  taking off regular lower body clothing?: Total 6 Click Score: 9   End of Session    Activity Tolerance: Other (comment);Patient limited by lethargy (limited by cognition) Patient left: in bed;with call bell/phone within reach;with bed alarm set  OT Visit Diagnosis: Other abnormalities of gait and mobility (R26.89);Unsteadiness on feet (R26.81);Muscle weakness (generalized) (M62.81)                Time: 9057-9043 OT Time Calculation (min): 14 min Charges:  OT General Charges $OT Visit: 1 Visit OT Evaluation $OT Eval Moderate Complexity: 1 Mod  Tarina Volk, OTR/L 09/29/23, 1:34 PM  Icelyn Navarrete E Virginie Josten 09/29/2023, 1:29 PM

## 2023-09-29 NOTE — ED Notes (Signed)
Echo in progress.

## 2023-09-29 NOTE — Progress Notes (Signed)
 Echocardiogram 2D Echocardiogram has been performed.  William Mills 09/29/2023, 10:42 AM

## 2023-09-29 NOTE — ED Notes (Signed)
 Pt brief changed.

## 2023-09-29 NOTE — ED Notes (Signed)
 Pt has dementia and is unable to understand or follow commands to access NIH.

## 2023-09-29 NOTE — ED Notes (Signed)
 Pt redirected multiple times to stay in bed. Pt confused and trying to climb out stating there is bombing.

## 2023-09-29 NOTE — Evaluation (Signed)
 Clinical/Bedside Swallow Evaluation Patient Details  Name: William Mills MRN: [id number] Date of Birth: 1935/01/02  Today's Date: 09/29/2023 Time: SLP Start Time (ACUTE ONLY): 1520 SLP Stop Time (ACUTE ONLY): 1620 SLP Time Calculation (min) (ACUTE ONLY): 60 min  Past Medical History:  Past Medical History:  Diagnosis Date   Cerebrovascular accident (HCC)    Dementia (HCC)    Diabetes mellitus without complication (HCC) 09/29/2023   Head injury 02/19/1968   right frontal   Hyperlipidemia    Hypertension    Seizure disorder (HCC)    Past Surgical History:  Past Surgical History:  Procedure Laterality Date   release of right undescended testicle     HPI:  Pt is a 89 y.o. male with medical history significant of Dementia residing at [facility], stroke s/p of brain aneurysm repair, s/p VP shunt, left eye blindness, seizure, dCHF, who presents with multiple falls in recent weeks, right-sided weakness, confusion.  Per daughter, at his normal baseline, patient is able to recognize her, sometimes knows the place, usually does not know the time.  In the past 2 weeks, patient has been more confused.  Today patient is not oriented.  He normally can walk with assistance, but now seems to have right-sided weakness, cannot walk any more.   He has had multiple falls, no significant injury.  No facial droop or slurred speech.    CT Of Head:  1. No acute intracranial abnormality.  2. 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  3. Right parietal approach VP shunt catheter in place with tip  terminating in the right lateral ventricle. Stable ventricular size  and morphology without hydrocephalus.  4. Small bilateral subdural collections measuring up to 2 mm on the  left and 3 mm on the right, likely related to shunting. No midline  shift.  5. Underlying atrophy with chronic small vessel ischemic disease.     Assessment / Plan / Recommendation  Clinical Impression   Pt seen for BSE this PM. Pt has been lethargic much of the day per ED NSG. Pt was seen post getting settled into bed/new room and positioning upright in bed; light verbal/tactile cues given to alert. Pt was nonverbal w/ eyes closed the majority of the evaluation. Open-mouth posture; responded to tactile cues to lips when accepting boluses. Noted decreased awareness of self/environment. Pt has a Baseline of Dementia per chart.  Pt on RA, afebrile. WBC WNL.  Pt appears to present w/ oropharyngeal phase dysphagia in setting of declined Cognitive status; Baseline Dementia. Family reported pt's presentation overall has declined in recent couple of weeks w/ falls and potential R sided weakness, favoring his R side.  ANY Cognitive decline can impact overall awareness/timing of swallow and oral clearing during po tasks which increases risk for aspiration, choking. Pt's risk for aspiration is present but can be reduced when following general aspiration precautions using a modified diet consistency of Pureed foods and Nectar liquids; pt is Edentulous at baseline(does not wear Dentures per family). He also appears to require verbal/tactile/visual cues for follow through w/ po tasks.       Pt was fed several trials of purees and Nectar consistency liquids given primarily tactile cues of spoon/straw to lips. No overt clinical s/s of aspiration noted: no cough and no decline in respiratory status during/post trials. O2 sats remained in upper 90s. OF NOTE: multiple swallows were appreciated via palpation. Oral phase was  c/b lengthy bolus management Time w/ chewing of purees and min oral residue towards end of the trials (fatigue?). Oral clearing of the boluses given was achieved w/ TIME GIVEN and verbal/tactile cues. Noted decreased oral awareness w/ boluses; oral prep deficits. OM Exam appeared [facility] w/ No unilateral weakness noted during bolus management.  Confusion during oral care noted.          In setting of Cognitive decline and dysphagia presentation at this assessment, recommend initiation of the dysphagia level 1(PUREE foods moistened w/ gravies) w/ Nectar liquids; general aspiration precautions. Recommend reducing Distractions during meals and engage pt to hold cup. Feeding Support and monitoring for oral clearing b/t bites. Pills Crushed in Puree for safer swallowing as needed.  MD/NSG updated.  ST services will f/u w/ ongoing toleration of diet and trials to upgrade diet when appropriate. Recommend follow w/ Palliative Care for GOC and education re: impact of Cognitive decline/Dementia on swallowing and oral intake in general. Precautions posted in room, chart.  ADDENDUM: in setting of pt's Baseline dx'd Dementia, recommend any f/u for cognitive-linguistic assessment be done at his Facility, if indicated (post acuity of illness and in his known setting). Daughter agreed.  SLP Visit Diagnosis: Dysphagia, oropharyngeal phase (R13.12) (Baseline Dementia; feeding dependency; Edentulous)    Aspiration Risk  Mild aspiration risk;Risk for inadequate nutrition/hydration    Diet Recommendation   Nectar;Dysphagia 1 (puree) (gravies) = dysphagia level 1(PUREE foods moistened w/ gravies) w/ Nectar liquids; general aspiration precautions. Recommend reducing Distractions during meals and engage pt to hold cup. Feeding Support and monitoring for oral clearing b/t bites.  Medication Administration: Crushed with puree    Other  Recommendations Recommended Consults:  (Dietician; Palliative Care) Oral Care Recommendations: Oral care BID;Oral care before and after PO;Staff/trained caregiver to provide oral care Caregiver Recommendations: Avoid jello, ice cream, thin soups, popsicles;Remove water pitcher;Have oral suction available     Assistance Recommended at Discharge  FULL  Functional Status Assessment Patient has had a recent decline in their  functional status and/or demonstrates limited ability to make significant improvements in function in a reasonable and predictable amount of time  Frequency and Duration min 2x/week  2 weeks       Prognosis Prognosis for improved oropharyngeal function: Fair Barriers to Reach Goals: Cognitive deficits;Language deficits;Time post onset;Severity of deficits Barriers/Prognosis Comment: Baseline Dementia; feeding dependency; Edentulous      Swallow Study   General Date of Onset: 09/28/23 HPI: Pt is a 89 y.o. male with medical history significant of Dementia residing at [facility], stroke s/p of brain aneurysm repair, s/p VP shunt, left eye blindness, seizure, dCHF, who presents with multiple falls in recent weeks, right-sided weakness, confusion.  Per daughter, at his normal baseline, patient is able to recognize her, sometimes knows the place, usually does not know the time.  In the past 2 weeks, patient has been more confused.  Today patient is not oriented.  He normally can walk with assistance, but now seems to have right-sided weakness, cannot walk any more.   He has had multiple falls, no significant injury.  No facial droop or slurred speech.    CT Of Head:  1. No acute intracranial abnormality.  2. 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  3. Right parietal approach VP shunt catheter in place with tip  terminating in the right lateral ventricle. Stable ventricular size  and morphology without hydrocephalus.  4. Small bilateral subdural collections measuring up to 2 mm on the  left and 3 mm on the right, likely related to shunting. No midline  shift.  5. Underlying atrophy with chronic small vessel ischemic disease. Type of Study: Bedside Swallow Evaluation Previous Swallow Assessment: none Diet Prior to this Study: NPO (soft foods at baseline per Dtr) Temperature Spikes Noted: No (wbc  4.2) Respiratory Status: Room air History of Recent Intubation: No Behavior/Cognition: Cooperative;Confused;Pleasant mood;Requires cueing;Doesn't follow directions (Eyes closed) Oral Cavity Assessment: Within Functional Limits Oral Care Completed by SLP: Yes Oral Cavity - Dentition: Edentulous (does not wear dentures) Vision:  (n/a) Self-Feeding Abilities: Total assist Patient Positioning: Upright in bed (full assist) Baseline Vocal Quality:  (Nonverbal) Volitional Cough: Cognitively unable to elicit Volitional Swallow: Unable to elicit    Oral/Motor/Sensory Function Overall Oral Motor/Sensory Function:  (no unilateral weakness during bolus management)   Ice Chips Ice chips: Not tested   Thin Liquid Thin Liquid: Not tested    Nectar Thick Nectar Thick Liquid: Impaired Presentation: Spoon;Straw (5 trials via spoon; 3 trials via straw) Oral Phase Impairments: Poor awareness of bolus;Reduced labial seal Oral phase functional implications: Prolonged oral transit Pharyngeal Phase Impairments: Multiple swallows Other Comments: no overt coughing   Honey Thick Honey Thick Liquid: Not tested   Puree Puree: Impaired Presentation: Spoon (fed; 10 trials) Oral Phase Impairments: Poor awareness of bolus;Reduced labial seal Oral Phase Functional Implications: Prolonged oral transit;Oral residue;Oral holding (chewing on bolus) Pharyngeal Phase Impairments: Multiple swallows Other Comments: no overt coughing   Solid     Solid: Not tested         Comer Portugal, MS, CCC-SLP Speech Language Pathologist Rehab Services; [facility] [phone] (ascom) Taeshawn Helfman 09/29/2023,5:07 PM

## 2023-09-29 NOTE — ED Notes (Addendum)
 Pt's daughter at bedside requesting to speak to the neurologist. Pt's physicians notified.

## 2023-09-29 NOTE — H&P (Signed)
 History and Physical    William Mills FMW:[id number] DOB: 15-Feb-1935 DOA: 09/28/2023  Referring MD/NP/PA:   PCP: Merilee Setter, NP   Patient coming from:  The patient is coming from memory care unit   Chief Complaint: Multiple falls, right-sided weakness, confusion  HPI: William Mills is a 89 y.o. male with medical history significant of stroke, s/p of brain aneurysm repair, s/p VP shunt, left eye blindness, seizure,dCHF, dementia, who presents with multiple falls, right-sided weakness, confusion.  Per her daughter at the bedside, at her normal baseline, patient recognize her, sometimes knows the place, usually not know the time.  In the past 2 weeks, patient has been more confused.  Today patient is not oriented x 3.  He normally can walk with assistance, but now seems to have right-sided weakness, cannot walk any more.  Sometimes patient is leaning to the left side. He has had multiple falls, no significant injury.  No facial droop or slurred speech.  Patient does not have chest pain, cough, SOB.  No nausea, vomiting, diarrhea or abdominal pain.  Not sure if patient has symptoms of UTI.  No fever or chills.  Patient has bilateral leg edema.  Data reviewed independently and ED Course: pt was found to have WBC 4.3, GFR> 60, negative UA, temperature normal, blood pressure 146/73, heart rate 71, RR 17, oxygen saturation 100% on room air.  X-ray negative frontal view C-spine, chest, abdomen pelvis. Patient is placed in padded bed for observation.   CT-C spin: 1. No acute fracture or listhesis of the cervical spine. 2. Degenerative disc and degenerative joint disease resulting in diffuse moderate neuroforaminal narrowing. 3. Mild emphysema. Emphysema (ICD10-J43.9).   CT HEAD:  1. No acute intracranial abnormality. 2. Postoperative changes from prior left pterional craniotomy for surgical clipping of aneurysm in the region of the left ICA terminus. 3. Right parietal approach VP  shunt catheter in place with tip terminating in the right lateral ventricle. Stable ventricular size and morphology without hydrocephalus. 4. Small bilateral subdural collections measuring up to 2 mm on the left and 3 mm on the right, likely related to shunting. No midline shift. 5. Underlying atrophy with chronic small vessel ischemic disease.   CTA HEAD AND NECK:   1. Postoperative changes from prior aneurysm repair at the left ICA terminus. 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 2. Markedly hypoplastic left vertebral artery, which occludes at the proximal V2 segment. Scant attenuated distal reconstitution at the left V3 segment. Left vertebral artery is grossly patent distally to the vertebrobasilar junction. 3. Atheromatous change about the carotid siphons with resultant moderate stenosis at the supraclinoid right ICA. 4. Focal severe distal right A3 stenosis.   Aortic Atherosclerosis (ICD10-I70.0) and Emphysema (ICD10-J43.9).      EKG: Not done in ED, will get one.     Review of Systems: Could not be reviewed due to dementia     Allergy:  Allergies  Allergen Reactions   Other Other (See Comments)    Can have NO MRI(s) because of a metal plate in his head   Nifedipine  Other (See Comments)    Edema    Carbamazepine Other (See Comments)    Reaction unknown   Donepezil  Other (See Comments)    Was SUSPENDED by the patient's MD due to the fact it was [facility] HIS HEART RATE TOO LOW   Penicillins Other (See Comments)    Reaction unknown  Phenytoin Other (See Comments)    Reaction unknown    Past Medical History:  Diagnosis Date   Cerebrovascular accident (HCC)    Dementia (HCC)    Diabetes mellitus without complication (HCC) 09/29/2023   Head injury 02/19/1968   right frontal   Hyperlipidemia    Hypertension    Seizure disorder [facility])     Past  Surgical History:  Procedure Laterality Date   release of right undescended testicle      Social History:  reports that he quit smoking about 15 years ago. His smoking use included cigarettes. He has never used smokeless tobacco. He reports that he does not drink alcohol and does not use drugs.  Family History:  Family History  Problem Relation Age of Onset   HIV Brother      Prior to Admission medications   Medication Sig Start Date End Date Taking? Authorizing Provider  amLODipine  (NORVASC ) 10 MG tablet Take 10 mg by mouth every evening.    [provider]  atorvastatin  (LIPITOR) 20 MG tablet take 1 tablet by mouth once daily Patient taking differently: Take 20 mg by mouth every evening. 12/27/19   Joshua Debby CROME, MD  BAYER ASPIRIN  EC LOW DOSE 81 MG tablet Take 81 mg by mouth in the morning. Swallow whole.    [provider]  bimatoprost  (LUMIGAN ) 0.01 % SOLN INSTILL ONE DROP INTO LEFT EYE EVERY DAY AT BEDTIME Patient not taking: Reported on 10/29/2022 04/02/11   Joshua Debby CROME, MD  brimonidine -timolol  (COMBIGAN ) 0.2-0.5 % ophthalmic solution Place 1 drop into the right eye 2 (two) times daily. Patient not taking: Reported on 12/14/2022 04/02/11   Joshua Debby CROME, MD  COMBIGAN  0.2-0.5 % ophthalmic solution Place 1 drop into the right eye every 12 (twelve) hours.    [provider]  donepezil  (ARICEPT ) 5 MG tablet take 1 tablet by mouth once daily Patient not taking: Reported on 10/29/2022 07/20/19   Joshua Debby CROME, MD  irbesartan -hydrochlorothiazide  (AVALIDE) 150-12.5 MG tablet Take 1 tablet by mouth daily. 01/25/19   Joshua Debby CROME, MD  levETIRAcetam  (KEPPRA  XR) 500 MG 24 hr tablet Take 2 tablets (1,000 mg total) by mouth 2 (two) times daily. 01/25/23 04/25/23  Malvina Alm DASEN, MD  metFORMIN (GLUCOPHAGE) 500 MG tablet Take 500 mg by mouth daily with breakfast.    [provider]  potassium chloride  SA (KLOR-CON  M) 20 MEQ tablet Take 1 tablet (20 mEq total)  by mouth 2 (two) times daily for 5 days. 01/25/23 01/30/23  Malvina Alm DASEN, MD    Physical Exam: Vitals:   09/28/23 1804 09/28/23 2230  BP: 132/60 (!) 146/73  Pulse: 78 71  Resp: 16 17  Temp: (!) 97.5 F (36.4 C) 97.9 F (36.6 C)  TempSrc: Axillary Axillary  SpO2: 100% 100%   General: Not in acute distress HEENT:       Eyes: right eye with PERRL, EOMI, no jaundice. Left eye blindness       ENT: No discharge from the ears and nose       Neck: No JVD, no bruit, no mass felt. Heme: No neck lymph node enlargement. Cardiac: S1/S2, RRR, No murmurs, No gallops or rubs. Respiratory: No rales, wheezing, rhonchi or rubs. GI: Soft, nondistended, nontender, no organomegaly, BS present. GU: No hematuria Ext: 1+ pitting leg edema bilaterally. 1+DP/PT pulse bilaterally. Musculoskeletal: No joint deformities, No joint redness or warmth, no limitation of ROM in spin. Skin: No rashes.  Neuro: Confused, not following command,  not oriented X3, cranial nerves II-XII grossly intact (left eye blindness), seems to have mild right leg weakness, examination is very limited since patient does not following command. Psych: Patient is not psychotic.  Labs on Admission: I have personally reviewed following labs and imaging studies  CBC: Recent Labs  Lab 09/24/23 0913 09/28/23 1954  WBC 4.7 4.3  NEUTROABS 2.1  --   HGB 13.8 12.6*  HCT 41.1 37.3*  MCV 86.3 86.5  PLT 224 206   Basic Metabolic Panel: Recent Labs  Lab 09/24/23 0913 09/28/23 1954  NA 139 140  K 3.7 3.6  CL 106 104  CO2 25 28  GLUCOSE 89 97  BUN 19 17  CREATININE 0.71 0.76  CALCIUM  9.1 9.1   GFR: Estimated Creatinine Clearance: 73.8 mL/min (by C-G formula based on SCr of 0.76 mg/dL). Liver Function Tests: Recent Labs  Lab 09/24/23 0913 09/28/23 1954  AST 16 16  ALT 10 10  ALKPHOS 53 54  BILITOT 1.1 1.3*  PROT 6.9 6.2*  ALBUMIN 3.9 3.4*   No results for input(s): LIPASE, AMYLASE in the last 168 hours. No  results for input(s): AMMONIA in the last 168 hours. Coagulation Profile: No results for input(s): INR, PROTIME in the last 168 hours. Cardiac Enzymes: No results for input(s): CKTOTAL, CKMB, CKMBINDEX, TROPONINI in the last 168 hours. BNP (last 3 results) No results for input(s): PROBNP in the last 8760 hours. HbA1C: No results for input(s): HGBA1C in the last 72 hours. CBG: Recent Labs  Lab 09/28/23 2008  GLUCAP 89   Lipid Profile: No results for input(s): CHOL, HDL, LDLCALC, TRIG, CHOLHDL, LDLDIRECT in the last 72 hours. Thyroid  Function Tests: No results for input(s): TSH, T4TOTAL, FREET4, T3FREE, THYROIDAB in the last 72 hours. Anemia Panel: No results for input(s): VITAMINB12, FOLATE, FERRITIN, TIBC, IRON, RETICCTPCT in the last 72 hours. Urine analysis:    Component Value Date/Time   COLORURINE YELLOW (A) 09/28/2023 2228   APPEARANCEUR CLEAR (A) 09/28/2023 2228   LABSPEC 1.025 09/28/2023 2228   PHURINE 5.0 09/28/2023 2228   GLUCOSEU NEGATIVE 09/28/2023 2228   GLUCOSEU NEGATIVE 11/21/2009 0940   HGBUR NEGATIVE 09/28/2023 2228   BILIRUBINUR NEGATIVE 09/28/2023 2228   KETONESUR NEGATIVE 09/28/2023 2228   PROTEINUR NEGATIVE 09/28/2023 2228   UROBILINOGEN 0.2 11/21/2009 0940   NITRITE NEGATIVE 09/28/2023 2228   LEUKOCYTESUR NEGATIVE 09/28/2023 2228   Sepsis Labs: @LABRCNTIP (procalcitonin:4,lacticidven:4) )No results found for this or any previous visit (from the past 240 hours).   Radiological Exams on Admission:   Assessment/Plan Principal Problem:   Right sided weakness Active Problems:   hx of stroke   Multiple falls   Seizure (HCC)   Essential hypertension   Chronic diastolic CHF (congestive heart failure) (HCC)   Diabetes mellitus with complication (HCC)   Hyperlipidemia with target LDL less than 130   Dementia arising in the senium and presenium (HCC)   Assessment and Plan:  Possible right sided  weakness: On examination, pt seems to have mild right leg weakness, but examination is very limited since patient does not following command.  Her daughter reports that patient has right-sided weakness and sometimes leaning to the left side.  Patient has had multiple falls.  Need to rule out stroke.  VP shunt stable by CT scan.  CTA of head and neck has no bleeding and radiologist commented on some chronic changes as well as some disruptions of the circulation but no sign of obvious or emergent abnormality, Since patient has VP shunt,  cannot do MRI of the brain.  -Placed on tele bed for observation -pt is out of window for permissive hypertension - continue ASA 81 mg daily - Statin: Lipitor 20 mg daily - fasting lipid panel and HbA1c  - 2D transthoracic echocardiography  - swallowing screen. If fails, will get SLP - PT/OT consult - please consult neurology in AM  hx of stroke -Continue aspirin  and Lipitor  Multiple falls -Fall precaution - PT/OT  Seizure (HCC) -Seizure precaution - As needed Ativan  for seizure - Continue home Keppra  1000 mg twice daily  Essential hypertension -IV hydralazine  as needed - Continue amlodipine , irbesartan , hydrochlorothiazide   Chronic diastolic CHF (congestive heart failure) (HCC): 2D echo 10/30/2022 showed EF of 60 to 65% with grade 1 diastolic dysfunction.  Patient has 1+ leg edema, no shortness of breath or pulm edema.  Does not seem to have CHF exacerbation. - Check BNP  Diabetes mellitus with complication (HCC), recent A1c 6.0, well-controlled.  Patient taking metformin -SSI  Hyperlipidemia with target LDL less than 130 -Lipitor  Dementia arising in the senium and presenium [facility]): Mental status is worse than baseline.  Patient is not taking donepezil . - Fall precaution      DVT ppx: SQ Lovenox   Code Status: DNR per his daughter  Family Communication:    Yes, patient's daughter and son-in-law   at bed side.    Disposition Plan: Memory  care unit  Consults called: None  Admission status and Level of care: Telemetry Medical:    for obs     Dispo: The patient is from: Memory care unit              Anticipated d/c is to: Memory care unit              Anticipated d/c date is: 1 day              Patient currently is not medically stable to d/c.    Severity of Illness:  The appropriate patient status for this patient is OBSERVATION. Observation status is judged to be reasonable and necessary in order to provide the required intensity of service to ensure the patient's safety. The patient's presenting symptoms, physical exam findings, and initial radiographic and laboratory data in the context of their medical condition is felt to place them at decreased risk for further clinical deterioration. Furthermore, it is anticipated that the patient will be medically stable for discharge from the hospital within 2 midnights of admission.        Date of Service 09/29/2023      Caleb Exon [facility]   If 7PM-7AM, please contact night-coverage [email or web address] 09/29/2023, 1:56 AM

## 2023-09-29 NOTE — ED Notes (Signed)
 Assumed care of patient, pt resting in bed, respirations even and unlabored, no distress noted.

## 2023-09-29 NOTE — Progress Notes (Signed)
 Progress Note    William Mills  FMW:[id number] DOB: 1935-02-04  DOA: 09/28/2023 PCP: Merilee Setter, NP      Brief Narrative:    Medical records reviewed and are as summarized below:  William Mills is a 89 y.o. male  with medical history significant of stroke, s/p of brain aneurysm repair, s/p VP shunt, left eye blindness, seizure,dCHF, dementia, who presents with multiple falls, right-sided weakness, confusion.   Per her daughter at the bedside, at her normal baseline, patient recognize her, sometimes knows the place, usually not know the time.  In the past 2 weeks, patient has been more confused.  Today patient is not oriented x 3.  He normally can walk with assistance, but now seems to have right-sided weakness, cannot walk any more.  Sometimes patient is leaning to the left side. He has had multiple falls, no significant injury.  Daughter reported recent fall about 5 days prior to admission.  He recently relocated from [place]  back to [place]  in September 2025 and he has been living at the memory care unit since November 2025.   CT-C spine: 1. No acute fracture or listhesis of the cervical spine. 2. Degenerative disc and degenerative joint disease resulting in diffuse moderate neuroforaminal narrowing. 3. Mild emphysema. Emphysema (ICD10-J43.9).     CT HEAD:   1. No acute intracranial abnormality. 2. Postoperative changes from prior left pterional craniotomy for surgical clipping of aneurysm in the region of the left ICA terminus. 3. Right parietal approach VP shunt catheter in place with tip terminating in the right lateral ventricle. Stable ventricular size and morphology without hydrocephalus. 4. Small bilateral subdural collections measuring up to 2 mm on the left and 3 mm on the right, likely related to shunting. No midline shift. 5. Underlying atrophy with chronic small vessel ischemic disease.   CTA HEAD AND NECK:   1. Postoperative  changes from prior aneurysm repair at the left ICA terminus. 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 2. Markedly hypoplastic left vertebral artery, which occludes at the proximal V2 segment. Scant attenuated distal reconstitution at the left V3 segment. Left vertebral artery is grossly patent distally to the vertebrobasilar junction. 3. Atheromatous change about the carotid siphons with resultant moderate stenosis at the supraclinoid right ICA. 4. Focal severe distal right A3 stenosis.   Aortic Atherosclerosis (ICD10-I70.0) and Emphysema (ICD10-J43.9).  Assessment/Plan:   Principal Problem:   Right sided weakness Active Problems:   hx of stroke   Multiple falls   Seizure (HCC)   Essential hypertension   Chronic diastolic CHF (congestive heart failure) (HCC)   Diabetes mellitus with complication (HCC)   Hyperlipidemia with target LDL less than 130   Dementia arising in the senium and presenium (HCC)    There is no height or weight on file to calculate BMI.    Reported right sided weakness, history of stroke: Patient does not follow commands but he is able to move both lower extremities spontaneously.  No acute stroke on CT head.  VP shunt is stable from CT head.  Cannot have MRI brain because of VP shunt. Case discussed with neurologist on-call, Dr. Matthews.  No further evaluation required at this time. Continue aspirin  and Lipitor. 2D echo is pending. He can follow-up with outpatient neurologist. PT and OT evaluation.   Multiple falls -Fall precaution - PT/OT    Seizure disorder -Seizure precaution - As  needed Ativan  for seizure - Continue home Keppra  1000 mg twice daily    Chronic diastolic CHF (congestive heart failure) (HCC): 2D echo 10/30/2022 showed EF of 60 to 65% with grade 1 diastolic dysfunction.  BNP was normal. No evidence of acute exacerbation at  this time.   Peripheral edema BNP normal.  Doubt acute CHF exacerbation at this time. Check venous duplex of lower extremities to rule out DVT.   Worsening altered mental status, possible acute metabolic encephalopathy vs progressive dementia  Patient likely has progressive dementia.SABRA He is dependent on ADLs and has had multiple falls recently. Discussed goals of care with William Mills, daughter, at the bedside. She confirmed the patient is DNR    Dysphagia He is n.p.o. for now.  Swallow evaluation is pending.   Comorbidities include hypertension, hyperlipidemia, type II DM (recent hemoglobin A1c 6)     Diet Order             Diet NPO time specified  Diet effective now                                  Consultants: None  Procedures: None    Medications:    [START ON 09/30/2023]  stroke: early stages of recovery book   Does not apply Once   amLODipine   10 mg Oral QPM   aspirin  EC  81 mg Oral Daily   atorvastatin   20 mg Oral QPM   brimonidine   1 drop Right Eye BID   And   timolol   1 drop Right Eye BID   enoxaparin  (LOVENOX ) injection  40 mg Subcutaneous Q24H   irbesartan   300 mg Oral Daily   And   hydrochlorothiazide   12.5 mg Oral Daily   insulin  aspart  0-5 Units Subcutaneous QHS   insulin  aspart  0-9 Units Subcutaneous TID WC   levETIRAcetam   1,000 mg Oral BID   Continuous Infusions:  sodium chloride  40 mL/hr at 09/29/23 0144     Anti-infectives (From admission, onward)    None              Family Communication/Anticipated D/C date and plan/Code Status   DVT prophylaxis: enoxaparin  (LOVENOX ) injection 40 mg Start: 09/29/23 1000     Code Status: Limited: Do not attempt resuscitation (DNR) -DNR-LIMITED -Do Not Intubate/DNI   Family Communication: Plan discussed with William Mills, daughter, at the bedside Disposition Plan: Plan to discharge to long-term care facility   Status is: Observation The patient will require care  spanning > 2 midnights and should be moved to inpatient because: Altered mental status       Subjective:   Interval events noted.  He is confused and cannot provide any history.  Echo tech was at the bedside when I entered the room  Objective:    Vitals:   09/29/23 0430 09/29/23 0730 09/29/23 0800 09/29/23 0830  BP: (!) 154/59 (!) 157/71 139/66 (!) 146/98  Pulse: 65 72 67 86  Resp: (!) 23 17 19  (!) 21  Temp:      TempSrc:      SpO2: 98% 100% 99% 96%   No data found.   Intake/Output Summary (Last 24 hours) at 09/29/2023 0900 Last data filed at 09/28/2023 2200 Gross per 24 hour  Intake --  Output 400 ml  Net -400 ml   There were no vitals filed for this visit.  Exam:  GEN: NAD SKIN: Warm and dry EYES: No  pallor or icterus ENT: MMM CV: RRR PULM: CTA B ABD: soft, ND, NT, +BS CNS: Limited exam because of confusion and inability to follow commands.  He is alert but nonverbal and does not answer any questions.  He moves extremities spontaneously. EXT: Bilateral leg and pedal edema.  No erythema or tenderness        Data Reviewed:   I have personally reviewed following labs and imaging studies:  Labs: Labs show the following:   Basic Metabolic Panel: Recent Labs  Lab 09/24/23 0913 09/28/23 1954 09/29/23 0614  NA 139 140 142  K 3.7 3.6 3.5  CL 106 104 107  CO2 25 28 29   GLUCOSE 89 97 89  BUN 19 17 14   CREATININE 0.71 0.76 0.70  CALCIUM  9.1 9.1 9.0   GFR Estimated Creatinine Clearance: 73.8 mL/min (by C-G formula based on SCr of 0.7 mg/dL). Liver Function Tests: Recent Labs  Lab 09/24/23 0913 09/28/23 1954  AST 16 16  ALT 10 10  ALKPHOS 53 54  BILITOT 1.1 1.3*  PROT 6.9 6.2*  ALBUMIN 3.9 3.4*   No results for input(s): LIPASE, AMYLASE in the last 168 hours. No results for input(s): AMMONIA in the last 168 hours. Coagulation profile No results for input(s): INR, PROTIME in the last 168 hours.  CBC: Recent Labs  Lab  09/24/23 0913 09/28/23 1954 09/29/23 0614  WBC 4.7 4.3 4.2  NEUTROABS 2.1  --   --   HGB 13.8 12.6* 12.0*  HCT 41.1 37.3* 36.3*  MCV 86.3 86.5 86.6  PLT 224 206 223   Cardiac Enzymes: No results for input(s): CKTOTAL, CKMB, CKMBINDEX, TROPONINI in the last 168 hours. BNP (last 3 results) No results for input(s): PROBNP in the last 8760 hours. CBG: Recent Labs  Lab 09/28/23 2008 09/29/23 0232 09/29/23 0753  GLUCAP 89 92 86   D-Dimer: No results for input(s): DDIMER in the last 72 hours. Hgb A1c: No results for input(s): HGBA1C in the last 72 hours. Lipid Profile: Recent Labs    09/29/23 0614  CHOL 170  HDL 74  LDLCALC 90  TRIG 30  CHOLHDL 2.3   Thyroid  function studies: No results for input(s): TSH, T4TOTAL, T3FREE, THYROIDAB in the last 72 hours.  Invalid input(s): FREET3 Anemia work up: No results for input(s): VITAMINB12, FOLATE, FERRITIN, TIBC, IRON, RETICCTPCT in the last 72 hours. Sepsis Labs: Recent Labs  Lab 09/24/23 0913 09/28/23 1954 09/29/23 0614  WBC 4.7 4.3 4.2    Microbiology No results found for this or any previous visit (from the past 240 hours).  Procedures and diagnostic studies:  CT Angio Head Neck W WO CM Result Date: 09/28/2023 CLINICAL DATA:  Initial evaluation for history of aneurysm. EXAM: CT ANGIOGRAPHY HEAD AND NECK WITH AND WITHOUT CONTRAST TECHNIQUE: Multidetector CT imaging of the head and neck was performed using the standard protocol during bolus administration of intravenous contrast. Multiplanar CT image reconstructions and MIPs were obtained to evaluate the vascular anatomy. Carotid stenosis measurements (when applicable) are obtained utilizing NASCET criteria, using the distal internal carotid diameter as the denominator. RADIATION DOSE REDUCTION: This exam was performed according to the departmental dose-optimization program which includes automated exposure control, adjustment of the mA  and/or kV according to patient size and/or use of iterative reconstruction technique. CONTRAST:  75mL OMNIPAQUE  IOHEXOL  350 MG/ML SOLN COMPARISON:  CT from 09/24/2023 FINDINGS: CT HEAD FINDINGS Brain: Postoperative changes from prior left pterional craniotomy for surgical clipping of aneurysm in the region of the left  ICA terminus. Chronic encephalomalacia of involving the left parietal and temporal lobes. Underlying atrophy with chronic small vessel ischemic disease. Right parietal approach VP shunt catheter in place with tip terminating in the right lateral ventricle. Stable ventricular size and morphology without hydrocephalus. Small bilateral subdural collections measuring up to 2 mm on the left and 3 mm on the right, likely related to shunting, and similar to prior. No midline shift. No acute large vessel territory infarct. No acute intracranial hemorrhage. No mass lesion. Vascular: No abnormal hyperdense vessel. Calcified atherosclerosis present at the skull base. Skull: Scalp soft tissues demonstrate no acute finding. Prior left pterional craniotomy. Right-sided VP shunt catheter in place. Sinuses/Orbits: Globes orbital soft tissues within normal limits. Paranasal sinuses are largely clear. No mastoid effusion. Other: None. Review of the MIP images confirms the above findings CTA NECK FINDINGS Aortic arch: Visualized aortic arch within normal limits for caliber with standard 3 vessel morphology. Aortic atherosclerosis. No significant stenosis about the origin the great vessels. Right carotid system: Right common and internal carotid arteries are patent without dissection. Atheromatous change about the right carotid bulb without hemodynamically significant greater than 50% stenosis. Left carotid system: Left common and internal carotid arteries are patent without dissection. Atheromatous change about the left carotid bulb without hemodynamically significant greater than 50% stenosis. Vertebral arteries: Both  vertebral arteries arise from subclavian arteries. Right vertebral artery dominant and patent without significant stenosis or dissection. Left vertebral artery markedly hypoplastic and patent at its origin, but occludes at the proximal V2 segment. Scant attenuated distal reconstitution at the left V3 segment (series 6, image 162). Left vertebral artery is patent as it crosses into the cranial vault. Skeleton: No worrisome osseous lesions. Moderate spondylosis at C5-6 through C7-T1. Patient is edentulous. Other neck: No other acute finding. Upper chest: Mild emphysema.  No other acute finding. Review of the MIP images confirms the above findings CTA HEAD FINDINGS Anterior circulation: Atheromatous change seen about the carotid siphons bilaterally. Resultant moderate stenosis at the supraclinoid right ICA (series 6, image 96). Postoperative changes from prior aneurysm repair at the left ICA terminus. 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 A1 segments patent bilaterally. Normal anterior communicating artery complex. Left ACA patent without stenosis. Focal severe distal right 8 3 stenosis (series 11, image 22). Right M1 segment patent without stenosis. Focus of calcified plaque noted within the distal left M1 segment without significant stenosis. No proximal MCA branch occlusion. Small vessel atheromatous irregularity noted throughout the MCA branches bilaterally. Posterior circulation: Dominant right V4 segment patent without significant stenosis. Right PICA patent at its origin. Left vertebral artery markedly hypoplastic and attenuated but grossly patent to the vertebrobasilar junction. Left PICA not seen. Basilar patent without stenosis. Superior cerebral arteries patent bilaterally. Predominant fetal type origin of the PCAs bilaterally. Both PCAs patent to their distal aspects without  significant stenosis. Venous sinuses: Not well assessed due to arterial timing the contrast bolus. Anatomic variants: As above. Review of the MIP images confirms the above findings IMPRESSION: CT HEAD: 1. No acute intracranial abnormality. 2. Postoperative changes from prior left pterional craniotomy for surgical clipping of aneurysm in the region of the left ICA terminus. 3. Right parietal approach VP shunt catheter in place with tip terminating in the right lateral ventricle. Stable ventricular size and morphology without hydrocephalus. 4. Small bilateral subdural collections measuring up to 2 mm on the  left and 3 mm on the right, likely related to shunting. No midline shift. 5. Underlying atrophy with chronic small vessel ischemic disease. CTA HEAD AND NECK: 1. Postoperative changes from prior aneurysm repair at the left ICA terminus. 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 2. Markedly hypoplastic left vertebral artery, which occludes at the proximal V2 segment. Scant attenuated distal reconstitution at the left V3 segment. Left vertebral artery is grossly patent distally to the vertebrobasilar junction. 3. Atheromatous change about the carotid siphons with resultant moderate stenosis at the supraclinoid right ICA. 4. Focal severe distal right A3 stenosis. Aortic Atherosclerosis (ICD10-I70.0) and Emphysema (ICD10-J43.9). Electronically Signed   By: Morene Hoard M.D.   On: 09/28/2023 23:34   DG Cervical Spine 1 View Result Date: 09/28/2023 CLINICAL DATA:  evaluate for shunt malfunction; [id number] AMS (altered mental status) [id number] EXAM: CHEST  1 VIEW; DG CERVICAL SPINE - 1 VIEW; ABDOMEN - 1 VIEW COMPARISON:  None Available. FINDINGS: The heart and mediastinal contours are within normal limits. No focal consolidation. No pulmonary edema. No pleural effusion. No pneumothorax. Excreted previously  administered intravenous contrast from bilateral collecting systems. Nonobstructive bowel gas pattern. No acute osseous abnormality. No kinking or discontinuity of the right ventriculoperitoneal shunt noted. Tip overlies the right mid abdomen. IMPRESSION: 1. Negative frontal view C-spine, chest, abdomen pelvis. 2. Right ventriculoperitoneal shunt with no discontinuity or kinking. Electronically Signed   By: Morgane  Naveau M.D.   On: 09/28/2023 21:51   DG Chest 1 View Result Date: 09/28/2023 CLINICAL DATA:  evaluate for shunt malfunction; [id number] AMS (altered mental status) [id number] EXAM: CHEST  1 VIEW; DG CERVICAL SPINE - 1 VIEW; ABDOMEN - 1 VIEW COMPARISON:  None Available. FINDINGS: The heart and mediastinal contours are within normal limits. No focal consolidation. No pulmonary edema. No pleural effusion. No pneumothorax. Excreted previously administered intravenous contrast from bilateral collecting systems. Nonobstructive bowel gas pattern. No acute osseous abnormality. No kinking or discontinuity of the right ventriculoperitoneal shunt noted. Tip overlies the right mid abdomen. IMPRESSION: 1. Negative frontal view C-spine, chest, abdomen pelvis. 2. Right ventriculoperitoneal shunt with no discontinuity or kinking. Electronically Signed   By: Morgane  Naveau M.D.   On: 09/28/2023 21:51   DG Abd 1 View Result Date: 09/28/2023 CLINICAL DATA:  evaluate for shunt malfunction; [id number] AMS (altered mental status) [id number] EXAM: CHEST  1 VIEW; DG CERVICAL SPINE - 1 VIEW; ABDOMEN - 1 VIEW COMPARISON:  None Available. FINDINGS: The heart and mediastinal contours are within normal limits. No focal consolidation. No pulmonary edema. No pleural effusion. No pneumothorax. Excreted previously administered intravenous contrast from bilateral collecting systems. Nonobstructive bowel gas pattern. No acute osseous abnormality. No kinking or discontinuity of the right ventriculoperitoneal shunt noted. Tip overlies the right  mid abdomen. IMPRESSION: 1. Negative frontal view C-spine, chest, abdomen pelvis. 2. Right ventriculoperitoneal shunt with no discontinuity or kinking. Electronically Signed   By: Morgane  Naveau M.D.   On: 09/28/2023 21:51   DG Skull 1-3 Views Result Date: 09/28/2023 CLINICAL DATA:  Evaluate for shunt malfunction EXAM: SKULL - 1-3 VIEW COMPARISON:  Head CT 09/24/2023. FINDINGS: VP shunt catheter is visualized in the right parietal region extending into the left neck. The visualized portion of the catheter appears intact. Temporal craniotomy again noted. No acute fractures. IMPRESSION: VP shunt catheter is visualized in the right parietal region extending into the left neck. Catheter appears intact. Electronically Signed  By: Greig Pique M.D.   On: 09/28/2023 21:49   CT Cervical Spine Wo Contrast Result Date: 09/28/2023 CLINICAL DATA:  Neck trauma (Age >= 65y), dementia EXAM: CT CERVICAL SPINE WITHOUT CONTRAST TECHNIQUE: Multidetector CT imaging of the cervical spine was performed without intravenous contrast. Multiplanar CT image reconstructions were also generated. RADIATION DOSE REDUCTION: This exam was performed according to the departmental dose-optimization program which includes automated exposure control, adjustment of the mA and/or kV according to patient size and/or use of iterative reconstruction technique. COMPARISON:  None Available. FINDINGS: Alignment: Normal. Skull base and vertebrae: Grade cervical alignment is normal. The atlantodental interval is not widened. No acute fracture of the cervical spine. Vertebral body height is preserved. Soft tissues and spinal canal: No prevertebral fluid or swelling. No visible canal hematoma. Disc levels: Disc space narrowing and endplate remodeling is seen within the cervical spine at C4-T1 in keeping with changes moderate degenerative disc disease. Prevertebral soft tissues are not thickened on sagittal reformats. Spinal is widely patent. Uncovertebral  and facet arthrosis results in diffuse moderate neuroforaminal narrowing Upper chest: Mild emphysema. Other: Ventriculoperitoneal shunt catheter tubing is seen within the right cervical soft tissues. IMPRESSION: 1. No acute fracture or listhesis of the cervical spine. 2. Degenerative disc and degenerative joint disease resulting in diffuse moderate neuroforaminal narrowing. 3. Mild emphysema. Emphysema (ICD10-J43.9). Electronically Signed   By: Dorethia Molt M.D.   On: 09/28/2023 21:48               LOS: 0 days   Loys Shugars  [facility]   Pager on [email or web address]. If 7PM-7AM, please contact night-coverage at [email or web address]     09/29/2023, 9:00 AM

## 2023-09-29 NOTE — ED Notes (Signed)
 Fall mats placed, falls sign placed, bed alarm turned on

## 2023-09-29 NOTE — ED Notes (Signed)
Pt medicated per MAR for agitation

## 2023-09-29 NOTE — ED Notes (Signed)
 Pharmacy messaged about missing medication doses

## 2023-09-29 NOTE — Evaluation (Signed)
 Physical Therapy Evaluation Patient Details Name: William Mills MRN: [id number] DOB: Dec 06, 1934 Today's Date: 09/29/2023  History of Present Illness  Pt is a 89 y.o. male who presents with multiple falls, right-sided weakness, confusion. PMH of stroke, s/p of brain aneurysm repair, s/p VP shunt, left eye blindness, seizure, dCHF, dementia  Clinical Impression  Patient admitted with the above. PTA, patient from [facility] memory care and unsure of PLOF as patient is a poor historian due to cognitive deficits. Only able to state his first name during session. Required maxA+2 for bed mobility and sit to stand from EOB with HHAx2. Patient with significant posterior lean in standing. Follows one step commands inconsistently with increased time and repetition. Patient will benefit from skilled PT services during acute stay to address listed deficits. Would benefit from discharge to memory care with no follow up as patient has poor rehab potential due to cognitive deficits.       If plan is discharge home, recommend the following: Two people to help with walking and/or transfers;A lot of help with bathing/dressing/bathroom;Assistance with cooking/housework;Direct supervision/assist for medications management;Direct supervision/assist for financial management;Assist for transportation;Supervision due to cognitive status   Can travel by private vehicle   No    Equipment Recommendations None recommended by PT  Functional Status Assessment Patient has had a recent decline in their functional status and/or demonstrates limited ability to make significant improvements in function in a reasonable and predictable amount of time     Precautions / Restrictions Precautions Precautions: Fall Recall of Precautions/Restrictions: Impaired Restrictions Weight Bearing Restrictions Per Provider Order: No      Mobility  Bed Mobility Overal bed mobility: Needs Assistance Bed Mobility: Supine to Sit, Sit  to Supine     Supine to sit: Max assist, +2 for physical assistance, +2 for safety/equipment Sit to supine: Max assist, +2 for safety/equipment, +2 for physical assistance        Transfers Overall transfer level: Needs assistance Equipment used: 2 person hand held assist Transfers: Sit to/from Stand Sit to Stand: Max assist, +2 physical assistance, +2 safety/equipment           General transfer comment: stood x 2 from EOB with heavy posterior lean    Ambulation/Gait                  Stairs            Wheelchair Mobility     Tilt Bed    Modified Rankin (Stroke Patients Only)       Balance Overall balance assessment: Needs assistance Sitting-balance support: Bilateral upper extremity supported, Feet supported Sitting balance-Leahy Scale: Poor     Standing balance support: Bilateral upper extremity supported, Reliant on assistive device for balance Standing balance-Leahy Scale: Poor                               Pertinent Vitals/Pain Pain Assessment Pain Assessment: PAINAD Breathing: normal Negative Vocalization: none Facial Expression: smiling or inexpressive Body Language: relaxed Consolability: no need to console PAINAD Score: 0 Pain Intervention(s): Monitored during session    Home Living Family/patient expects to be discharged to:: Other (Comment)                   Additional Comments: Memory Care at [facility]    Prior Function Prior Level of Function : Patient poor historian/Family not available  Mobility Comments: Unsure of baseline due to no family present and patient's cognitive status       Extremity/Trunk Assessment   Upper Extremity Assessment Upper Extremity Assessment: Defer to OT evaluation    Lower Extremity Assessment Lower Extremity Assessment: Generalized weakness    Cervical / Trunk Assessment Cervical / Trunk Assessment: Kyphotic  Communication    Communication Communication: Impaired Factors Affecting Communication: Hearing impaired;Difficulty expressing self    Cognition Arousal: Lethargic Behavior During Therapy: Flat affect   PT - Cognitive impairments: History of cognitive impairments                       PT - Cognition Comments: follows one step command with increased time and repetition, however inconsistently. Only able to state first name Following commands: Impaired Following commands impaired: Follows one step commands with increased time, Follows one step commands inconsistently     Cueing Cueing Techniques: Verbal cues, Gestural cues, Tactile cues     General Comments      Exercises     Assessment/Plan    PT Assessment Patient needs continued PT services  PT Problem List Decreased strength;Decreased activity tolerance;Decreased mobility;Decreased balance;Decreased coordination;Decreased cognition;Decreased safety awareness;Decreased knowledge of use of DME;Decreased knowledge of precautions;Cardiopulmonary status limiting activity       PT Treatment Interventions DME instruction;Gait training;Functional mobility training;Therapeutic activities;Therapeutic exercise;Balance training;Neuromuscular re-education;Patient/family education    PT Goals (Current goals can be found in the Care Plan section)  Acute Rehab PT Goals Patient Stated Goal: did not state PT Goal Formulation: Patient unable to participate in goal setting Time For Goal Achievement: 10/13/23 Potential to Achieve Goals: Poor    Frequency Min 1X/week     Co-evaluation               AM-PAC PT 6 Clicks Mobility  Outcome Measure Help needed turning from your back to your side while in a flat bed without using bedrails?: Total Help needed moving from lying on your back to sitting on the side of a flat bed without using bedrails?: Total Help needed moving to and from a bed to a chair (including a wheelchair)?: Total Help  needed standing up from a chair using your arms (e.g., wheelchair or bedside chair)?: Total Help needed to walk in hospital room?: Total Help needed climbing 3-5 steps with a railing? : Total 6 Click Score: 6    End of Session   Activity Tolerance: Patient limited by lethargy Patient left: in bed;with call bell/phone within reach;with bed alarm set Nurse Communication: Mobility status PT Visit Diagnosis: Muscle weakness (generalized) (M62.81);Unsteadiness on feet (R26.81);Other abnormalities of gait and mobility (R26.89);History of falling (Z91.81)    Time: 0942-1000 PT Time Calculation (min) (ACUTE ONLY): 18 min   Charges:   PT Evaluation $PT Eval Moderate Complexity: 1 Mod   PT General Charges $$ ACUTE PT VISIT: 1 Visit         Maryanne Finder, PT, DPT Physical Therapist - [facility]  [facility]  Treshun Wold A Bronsyn Shappell 09/29/2023, 12:10 PM

## 2023-09-30 DIAGNOSIS — R569 Unspecified convulsions: Secondary | ICD-10-CM | POA: Diagnosis not present

## 2023-09-30 DIAGNOSIS — F0390 Unspecified dementia without behavioral disturbance: Secondary | ICD-10-CM | POA: Diagnosis not present

## 2023-09-30 DIAGNOSIS — R296 Repeated falls: Secondary | ICD-10-CM | POA: Diagnosis not present

## 2023-09-30 DIAGNOSIS — Z515 Encounter for palliative care: Secondary | ICD-10-CM

## 2023-09-30 DIAGNOSIS — R4182 Altered mental status, unspecified: Secondary | ICD-10-CM | POA: Diagnosis not present

## 2023-09-30 DIAGNOSIS — R531 Weakness: Secondary | ICD-10-CM | POA: Diagnosis not present

## 2023-09-30 LAB — GLUCOSE, CAPILLARY
Glucose-Capillary: 92 mg/dL (ref 70–99)
Glucose-Capillary: 113 mg/dL — ABNORMAL HIGH (ref 70–99)
Glucose-Capillary: 132 mg/dL — ABNORMAL HIGH (ref 70–99)
Glucose-Capillary: 127 mg/dL — ABNORMAL HIGH (ref 70–99)

## 2023-09-30 LAB — CBC
HCT: 39.2 % (ref 39.0–52.0)
Hemoglobin: 12.8 g/dL — ABNORMAL LOW (ref 13.0–17.0)
MCH: 28.4 pg (ref 26.0–34.0)
MCHC: 32.7 g/dL (ref 30.0–36.0)
MCV: 87.1 fL (ref 80.0–100.0)
Platelets: 235 K/uL (ref 150–400)
RBC: 4.5 MIL/uL (ref 4.22–5.81)
RDW: 13.5 % (ref 11.5–15.5)
WBC: 5 K/uL (ref 4.0–10.5)
nRBC: 0 % (ref 0.0–0.2)

## 2023-09-30 LAB — PHOSPHORUS: Phosphorus: 2.2 mg/dL — ABNORMAL LOW (ref 2.5–4.6)

## 2023-09-30 LAB — BASIC METABOLIC PANEL WITH GFR
Anion gap: 10 (ref 5–15)
BUN: 11 mg/dL (ref 8–23)
CO2: 27 mmol/L (ref 22–32)
Calcium: 9 mg/dL (ref 8.9–10.3)
Chloride: 103 mmol/L (ref 98–111)
Creatinine, Ser: 0.64 mg/dL (ref 0.61–1.24)
GFR, Estimated: >60 mL/min (ref >60)
Glucose, Bld: 97 mg/dL (ref 70–99)
Potassium: 3.3 mmol/L — ABNORMAL LOW (ref 3.5–5.1)
Sodium: 140 mmol/L (ref 135–145)

## 2023-09-30 LAB — MAGNESIUM: Magnesium: 2.1 mg/dL (ref 1.7–2.4)

## 2023-09-30 LAB — VITAMIN B12: Vitamin B-12: 174 pg/mL — ABNORMAL LOW (ref 180–914)

## 2023-09-30 LAB — CK: Total CK: 200 U/L (ref 49–397)

## 2023-09-30 LAB — HEMOGLOBIN A1C
Hgb A1c MFr Bld: 5.5 % (ref 4.8–5.6)
Mean Plasma Glucose: 111 mg/dL

## 2023-09-30 LAB — TSH: TSH: 1.757 u[IU]/mL (ref 0.350–4.500)

## 2023-09-30 MED ORDER — CYANOCOBALAMIN 1000 MCG/ML IJ SOLN
1000.0000 ug | Freq: Every day | INTRAMUSCULAR | Status: DC
  Administered 2023-09-30 – 2023-10-02 (×5): 1000 ug via INTRAMUSCULAR
  Filled 2023-09-30 (×3): qty 1

## 2023-09-30 MED ORDER — K PHOS MONO-SOD PHOS DI & MONO 155-852-130 MG PO TABS
500.0000 mg | ORAL_TABLET | Freq: Four times a day (QID) | ORAL | Status: AC
  Administered 2023-09-30 (×4): 500 mg via ORAL
  Filled 2023-09-30 (×2): qty 2

## 2023-09-30 MED ORDER — HYDRALAZINE HCL 20 MG/ML IJ SOLN
10.0000 mg | INTRAMUSCULAR | Status: DC | PRN
  Administered 2023-09-30 (×2): 10 mg via INTRAVENOUS
  Filled 2023-09-30: qty 1

## 2023-09-30 MED ORDER — HYDRALAZINE HCL 50 MG PO TABS: 50.0000 mg | ORAL_TABLET | Freq: Four times a day (QID) | ORAL | Status: DC | PRN

## 2023-09-30 MED ORDER — VITAMIN B-12 1000 MCG PO TABS: 1000.0000 ug | ORAL_TABLET | Freq: Every day | ORAL | Status: DC

## 2023-09-30 NOTE — NC FL2 (Signed)
 [place]  MEDICAID [id number] LEVEL OF CARE FORM     IDENTIFICATION  Patient Name: William Mills Birthdate: 1934-08-27 Sex: male Admission Date (Current Location): 09/28/2023  [place] and [place] Number:  Chiropodist and Address:  [facility], [address] [place], [place] [address]      Provider Number: [id number]  Attending Physician Name and Address:  Von Bellis, MD  Relative Name and Phone Number:  Charletta Bruin (Daughter)  [phone]    Current Level of Care:   Recommended Level of Care: Skilled Nursing Facility Prior Approval Number:    Date Approved/Denied:   PASRR Number:    Discharge Plan: SNF    Current Diagnoses: Patient Active Problem List   Diagnosis Date Noted   hx of stroke 09/29/2023   Right sided weakness 09/29/2023   Multiple falls 09/29/2023   Diabetes mellitus with complication (HCC) 09/29/2023   Chronic diastolic CHF (congestive heart failure) (HCC) 09/29/2023   Type 2 diabetes mellitus (HCC) 12/16/2022   Syncope 10/29/2022   Gait instability 07/22/2014   Localization-related symptomatic epilepsy and epileptic syndromes with complex partial seizures, not intractable, without status epilepticus (HCC) 12/03/2013   Routine general medical examination at a health care facility 12/24/2012   Encounter for long-term (current) use of other medications 08/21/2011   Hyperlipidemia with target LDL less than 130 11/21/2009   Dementia arising in the senium and presenium (HCC) 11/21/2009   Other specified glaucoma 11/21/2009   Essential hypertension 11/21/2009   DEGENERATIVE JOINT DISEASE, BOTH KNEES, SEVERE 11/21/2009   Seizure (HCC) 11/21/2009    Orientation RESPIRATION BLADDER Height & Weight     Self, Time, Situation  Normal Incontinent Weight:   Height:  6' 2 (188 cm)  BEHAVIORAL SYMPTOMS/MOOD NEUROLOGICAL BOWEL NUTRITION STATUS      Incontinent Diet (DYS 1)  AMBULATORY STATUS COMMUNICATION OF NEEDS Skin    Extensive Assist Verbally                         Personal Care Assistance Level of Assistance  Bathing, Feeding, Dressing Bathing Assistance: Maximum assistance Feeding assistance: Maximum assistance Dressing Assistance: Maximum assistance     Functional Limitations Info  Sight          SPECIAL CARE FACTORS FREQUENCY                       Contractures      Additional Factors Info  Allergies, Code Status Code Status Info: DNR Allergies Info: Can have NO MRI(s) because of a metal plate in his head,  Carbamazepine, Penicillins, Phenytoin,Nifedipine , Donepezil            Current Medications (09/30/2023):  This is the current hospital active medication list Current Facility-Administered Medications  Medication Dose Route Frequency Provider Last Rate Last Admin    stroke: early stages of recovery book   Does not apply Once Niu, Xilin, MD       acetaminophen  (TYLENOL ) tablet 650 mg  650 mg Oral Q4H PRN Niu, Xilin, MD       Or   acetaminophen  (TYLENOL ) 160 MG/5ML solution 650 mg  650 mg Per Tube Q4H PRN Niu, Xilin, MD       Or   acetaminophen  (TYLENOL ) suppository 650 mg  650 mg Rectal Q4H PRN Niu, Xilin, MD       amLODipine  (NORVASC ) tablet 10 mg  10 mg Oral QPM Niu, Xilin, MD   10 mg at 09/29/23 [phone]  aspirin  EC tablet 81 mg  81 mg Oral Daily Niu, Xilin, MD       atorvastatin  (LIPITOR) tablet 20 mg  20 mg Oral QPM Niu, Xilin, MD   20 mg at 09/29/23 1848   brimonidine  (ALPHAGAN ) 0.2 % ophthalmic solution 1 drop  1 drop Right Eye BID Dail Rankin RAMAN, RPH   1 drop at 09/29/23 2130   And   timolol  (TIMOPTIC ) 0.5 % ophthalmic solution 1 drop  1 drop Right Eye BID Dail Rankin RAMAN, RPH   1 drop at 09/29/23 2132   enoxaparin  (LOVENOX ) injection 40 mg  40 mg Subcutaneous Q24H Niu, Xilin, MD   40 mg at 09/29/23 1213   hydrALAZINE  (APRESOLINE ) injection 10 mg  10 mg Intravenous Q2H PRN Von Bellis, MD       Or   hydrALAZINE  (APRESOLINE ) tablet 50 mg  50 mg Oral Q6H PRN  Von Bellis, MD       irbesartan  (AVAPRO ) tablet 300 mg  300 mg Oral Daily Dail Rankin RAMAN, RPH       And   hydrochlorothiazide  (HYDRODIURIL ) tablet 12.5 mg  12.5 mg Oral Daily Belue, Nathan S, RPH       insulin  aspart (novoLOG ) injection 0-5 Units  0-5 Units Subcutaneous QHS Niu, Xilin, MD       insulin  aspart (novoLOG ) injection 0-9 Units  0-9 Units Subcutaneous TID WC Niu, Xilin, MD       levETIRAcetam  (KEPPRA  XR) 24 hr tablet 1,000 mg  1,000 mg Oral BID Niu, Xilin, MD   1,000 mg at 09/29/23 2111   LORazepam  (ATIVAN ) injection 2 mg  2 mg Intravenous Q2H PRN Niu, Xilin, MD   2 mg at 09/29/23 0222   ondansetron  (ZOFRAN ) injection 4 mg  4 mg Intravenous Q8H PRN Niu, Xilin, MD       senna-docusate (Senokot-S) tablet 1 tablet  1 tablet Oral QHS PRN Niu, Xilin, MD         Discharge Medications: Please see discharge summary for a list of discharge medications.  Relevant Imaging Results:  Relevant Lab Results:   Additional Information [id number]  Dalia RAMAN Fuse, RN

## 2023-09-30 NOTE — Consult Note (Signed)
 Consultation Note Date: 09/30/2023 at 1145  Patient Name: William Mills  DOB: April 14, 1934  MRN: [id number]  Age / Sex: 89 y.o., male  PCP: Merilee Setter, NP Referring Physician: Von Bellis, MD  HPI/Patient Profile: 89 y.o. male  with past medical history of stroke, s/p of brain aneurysm repair, s/p VP shunt, left eye blindness, seizure,dCHF, dementia  admitted on 09/28/2023 with multiple falls, right-sided, and confusion.  CT of C-spine revealed no acute fractures.  CT of head revealed no acute intracranial abnormalities.  Neurology was consulted and no further evaluation is at this time.  Patient is being treated for altered mental status with possible acute metabolic encephalopathy but more likely due to progressive dementia.  PMT was consulted to support patient and family with goals of care discussions..   Clinical Assessment and Goals of Care: Extensive chart review completed prior to meeting patient including labs, vital signs, imaging, progress notes, orders, and available advanced directive documents from current and previous encounters. I then met with patient and his daughter William Mills at bedside to discuss diagnosis prognosis, GOC, EOL wishes, disposition and options.  I introduced Palliative Medicine as specialized medical care for people living with serious illness. It focuses on providing relief from the symptoms and stress of a serious illness. The goal is to improve quality of life for both the patient and the family.  We discussed a brief life review of the patient.  Patient is a widowed, lives in the memory care unit at [facility], and his daughter William Mills lives nearby and checks on him.  Daily.  He has several children, 2 of which are deceased.  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  As far as functional and  nutritional status patient's daughter endorses that he was active in his memory care unit, able to feed himself and use a walker.  However, over the last 2 weeks she has noticed a significant on.  He was no longer able to stand, pivot, or walk.  She found him in a wheelchair on Sunday, unable to follow commands.  He also has been having difficulty feeding himself and not having as large of an appetite as he once did.  We discussed patient's current illness and what it means in the larger context of patient's on-going co-morbidities.  Discussed dementia as a chronic, progressive, and irreversible disease that is often exacerbated by acute illnesses and hospitalizations.  Natural disease trajectory and expectations at EOL were discussed.  William Mills shares that she saw her mother going through the very stages of dementia before her passing in 2011.  She is familiar with the nature of the disease.  I attempted to elicit values and goals of care important to the patient. William Mills says she just wants her father to be comfortable.  She appreciates that his dementia is not reversible but she hopes that he can be kept as comfortable as possible.  In light of these comments, we discussed comfort measures, hospice  services and philosophy, and aging in place. The difference between aggressive medical intervention and comfort care was considered in light of the patient's goals of care.   After extended discussion, William Mills shares that she is interested in having her father discharged back to [facility] with hospice services to follow.  She is not interested in shifting to full comfort measures at this time.  Discussed importance of keeping his daily medications-Keppra  etc.-so that patient can continue to age in place.  No change to [facility] at this time.  Notified TOC and attending of patient's daughter's wishes to return to facility with hospice services to follow.  Notify patient's daughter that TOC will be in touch to  offer choice of hospice agencies.  PMT will continue to follow and support.  Primary Decision Maker NEXT OF KIN  Physical Exam Vitals reviewed.  Constitutional:      General: He is not in acute distress.    Appearance: He is normal weight.  HENT:     Head: Normocephalic.     Mouth/Throat:     Mouth: Mucous membranes are moist.  Eyes:     Pupils: Pupils are equal, round, and reactive to light.  Pulmonary:     Effort: Pulmonary effort is normal.  Abdominal:     Palpations: Abdomen is soft.  Musculoskeletal:     Comments: Generalized weakness  Skin:    General: Skin is warm and dry.  Neurological:     Mental Status: He is alert.     Comments: nonverbal     Palliative Assessment/Data: 30%     Thank you for this consult. Palliative medicine will continue to follow and assist holistically.   Time Total: 75 minutes  Time spent includes: Detailed review of medical records (labs, imaging, vital signs), medically appropriate exam (mental status, respiratory, cardiac, skin), discussed with treatment team, counseling and educating patient, family and staff, documenting clinical information, medication management and coordination of care.  Signed by: Lamarr Gunner, DNP, FNP-BC Palliative Medicine   Please contact Palliative Medicine Team providers via [facility] for questions and concerns.

## 2023-09-30 NOTE — Progress Notes (Signed)
 Daughter William Mills in to visit patient. Mills explained recent events and father being in and out of hospital. Asked if goals for father have been made, Mills stated him being comfortable Mills expressed interest in talking about palliative care and having questions answered regarding end of life care. Dr. Von made aware.

## 2023-09-30 NOTE — Progress Notes (Addendum)
 Speech Language Pathology Treatment: Dysphagia  Patient Details Name: William Mills MRN: [id number] DOB: 10-24-1934 Today's Date: 09/30/2023 Time: 1400-1500 SLP Time Calculation (min) (ACUTE ONLY): 60 min  Assessment / Plan / Recommendation Clinical Impression  Pt seen today for ongoing assessment of swallowing including toleration of current dysphagia diet and trials to upgrade to thin liquids in his diet if able. Daughter present for session; education given/discussed on general swallowing, dysphagia.   Pt on RA, afebrile.   Education re: swallowing, dysphagia in setting of Dementia, diet consistency rec'd, supportive strategies during meals, and general aspiration precautions including recommendation for safer swallowing of Pills by use of a PUREE -- for all Pill swallowing was had w/ Daughter present. Discussed the strategy of Pinching the straw to limit bolus volume and fast sipping/drinking in order to lessen risk for aspiration- this was practiced during session also. Pt has a Baseline of Dementia, which Cognitive decline can hinder awareness of self and safe eating/drinking. He benefits from support w/ all po feeding at this time; Supervision.    Discussed w/ pt the current diet consistency including use of Pureed foods moistened well in order to provide ease of oral intake, which could increase overall intake.  PO trials of thin liquids were assessed in order to upgrade diet from Nectar consistency liquids. Using the Pinched straw strategy to reduce volume, pt consumed ~10+ trials of thins w/ No overt s/s of aspiration; no decline in vocal quality, no coughing, and no decline in respiratory presentation. Similar was noted w/ purees. Oral phase was grossly [facility] for bolus management and oral clearing.  Highlighted and practiced w/ Daughter the general aspiration precautions as utilized to include Small sips Slowly, Pinched straw to limit volume, and Sitting fully Upright w/ all oral intake.  Recommended reducing distractions including Talking and clearing mouth fully b/t Small bites/sips. Also discussed food consistencies and options, preparation, and use of condiments to soften foods also. Encouraged ALL Pills to be swallowed using a Puree- CRUSHED vs WHOLE as indicated by NSG(for safer clearing/swallowing).   Handouts given to Daughter on aspiration precautions including Dysphagia Drink Cup and Pill swallowing option; diet consistency and preparation; swallowing strategies/suggestions.     Recommend continue a Dysphagia level 1(Puree) w/ Thin liquids- monitor Pinched straw use and small sips via Cup w/ aspiration precautions; Pills in a Puree for safer swallowing. Education completed w/ pt and Daughter's- questions answered. Recommendation for f/u at next venue of care as needs indicate; Dtr agreed. MD/NSG updated.      HPI HPI: Pt is a 89 y.o. male with medical history significant of Dementia residing at [facility], stroke s/p of brain aneurysm repair, s/p VP shunt, left eye blindness, seizure, dCHF, who presents with multiple falls in recent weeks, right-sided weakness, confusion.  Per daughter, at his normal baseline, patient is able to recognize her, sometimes knows the place, usually does not know the time.  In the past 2 weeks, patient has been more confused.  Today patient is not oriented.  He normally can walk with assistance, but now seems to have right-sided weakness, cannot walk any more.   He has had multiple falls, no significant injury.  No facial droop or slurred speech.    CT Of Head:  1. No acute intracranial abnormality.  2. 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  3. Right parietal approach VP shunt catheter in place with tip  terminating in the right lateral ventricle. Stable ventricular size  and morphology without hydrocephalus.  4. Small bilateral subdural collections  measuring up to 2 mm on the  left and 3 mm on the right, likely related to shunting. No midline  shift.  5. Underlying atrophy with chronic small vessel ischemic disease.      SLP Plan  Continue with current plan of care          Recommendations  Diet recommendations: Dysphagia 1 (puree);Thin liquid Liquids provided via: Cup;Straw (monitor- pinch straw) Medication Administration: Crushed with puree Supervision: Staff to assist with self feeding;Full supervision/cueing for compensatory strategies Compensations: Minimize environmental distractions;Slow rate;Small sips/bites;Lingual sweep for clearance of pocketing;Multiple dry swallows after each bite/sip;Follow solids with liquid Postural Changes and/or Swallow Maneuvers: Out of bed for meals;Seated upright 90 degrees;Upright 30-60 min after meal                 (Palliative Care f/u for support/GOC; Dietician) Oral care BID;Oral care before and after PO;Staff/trained caregiver to provide oral care   Frequent or constant Supervision/Assistance Dysphagia, oral phase (R13.11) (Baseline Dementia; feeding dependency; Edentulous)     Continue with current plan of care       Comer Portugal, MS, CCC-SLP Speech Language Pathologist Rehab Services; [facility] [phone] (ascom) William Mills  09/30/2023, 5:12 PM

## 2023-09-30 NOTE — Plan of Care (Signed)
  Problem: Nutrition: Goal: Risk of aspiration will decrease Outcome: Progressing   Problem: Elimination: Goal: Will not experience complications related to bowel motility Outcome: Progressing

## 2023-09-30 NOTE — TOC Initial Note (Addendum)
 Transition of Care [facility]) - Initial/Assessment Note    Patient Details  Name: William Mills MRN: [id number] Date of Birth: 1934/05/31  Transition of Care [facility]) CM/SW Contact:    Dalia GORMAN Fuse, RN Phone Number: 09/30/2023, 8:55 AM  Clinical Narrative:                 Patient is from [place] unit. TOC outreached to the facility and lvmm for the memory care Manager, Shawnee Novak. TOC is awaiting callback.   1530: TOC reached out to [facility] and spoke with Shawnee Novak. Per Shawnee they patient doesn't need to be reassessed and can come back to the facility.  Expected Discharge Plan: Memory Care Barriers to Discharge: Continued Medical Work up   Patient Goals and CMS Choice            Expected Discharge Plan and Services       Living arrangements for the past 2 months: Assisted Living Facility                                      Prior Living Arrangements/Services Living arrangements for the past 2 months: Assisted Living Facility Lives with:: Facility Resident                   Activities of Daily Living   ADL Screening (condition at time of admission) Independently performs ADLs?: No Does the patient have a NEW difficulty with bathing/dressing/toileting/self-feeding that is expected to last >3 days?: No Does the patient have a NEW difficulty with getting in/out of bed, walking, or climbing stairs that is expected to last >3 days?: No Does the patient have a NEW difficulty with communication that is expected to last >3 days?: No Is the patient deaf or have difficulty hearing?: Yes Does the patient have difficulty seeing, even when wearing glasses/contacts?: No Does the patient have difficulty concentrating, remembering, or making decisions?: Yes  Permission Sought/Granted                  Emotional Assessment              Admission diagnosis:  Ataxia [R27.0] Weakness [R53.1] Stroke (HCC) [I63.9] Right sided weakness  [R53.1] Altered mental status, unspecified altered mental status type [R41.82] Patient Active Problem List   Diagnosis Date Noted   hx of stroke 09/29/2023   Right sided weakness 09/29/2023   Multiple falls 09/29/2023   Diabetes mellitus with complication (HCC) 09/29/2023   Chronic diastolic CHF (congestive heart failure) (HCC) 09/29/2023   Type 2 diabetes mellitus (HCC) 12/16/2022   Syncope 10/29/2022   Gait instability 07/22/2014   Localization-related symptomatic epilepsy and epileptic syndromes with complex partial seizures, not intractable, without status epilepticus (HCC) 12/03/2013   Routine general medical examination at a health care facility 12/24/2012   Encounter for long-term (current) use of other medications 08/21/2011   Hyperlipidemia with target LDL less than 130 11/21/2009   Dementia arising in the senium and presenium (HCC) 11/21/2009   Other specified glaucoma 11/21/2009   Essential hypertension 11/21/2009   DEGENERATIVE JOINT DISEASE, BOTH KNEES, SEVERE 11/21/2009   Seizure (HCC) 11/21/2009   PCP:  Merilee Setter, NP Pharmacy:   [facility] - [place], [place] - [address] [place] [address] Phone: [phone] Fax: [phone]     Social Drivers of Health (SDOH) Social History: SDOH Screenings  Food Insecurity: No Food Insecurity (09/29/2023)  Housing: Low Risk  (09/29/2023)  Transportation Needs: No Transportation Needs (09/29/2023)  Utilities: Not At Risk (09/29/2023)  Social Connections: Patient Unable To Answer (09/29/2023)  Tobacco Use: Medium Risk (09/28/2023)   SDOH Interventions:     Readmission Risk Interventions    10/31/2022    2:58 PM  Readmission Risk Prevention Plan  Post Dischage Appt Complete  Medication Screening Complete  Transportation Screening Complete

## 2023-09-30 NOTE — Care Management Obs Status (Signed)
 MEDICARE OBSERVATION STATUS NOTIFICATION   Patient Details  Name: William Mills MRN: [id number] Date of Birth: [birth year]   Medicare Observation Status Notification Given:  Chaney BRANDY CHRISTIANE LELON, CMA 09/30/2023, 10:14 AM

## 2023-09-30 NOTE — Progress Notes (Addendum)
 Progress Note    William Mills  FMW:[id number] DOB: 31-Jul-1934  DOA: 09/28/2023 PCP: Merilee Setter, NP      Brief Narrative:    Medical records reviewed and are as summarized below:  William Mills is a 89 y.o. male  with medical history significant of stroke, s/p of brain aneurysm repair, s/p VP shunt, left eye blindness, seizure,dCHF, dementia, who presents with multiple falls, right-sided weakness, confusion.   Per her daughter at the bedside, at her normal baseline, patient recognize her, sometimes knows the place, usually not know the time.  In the past 2 weeks, patient has been more confused.  Today patient is not oriented x 3.  He normally can walk with assistance, but now seems to have right-sided weakness, cannot walk any more.  Sometimes patient is leaning to the left side. He has had multiple falls, no significant injury.  Daughter reported recent fall about 5 days prior to admission.  He recently relocated from [place]  back to [place]  in September 2025 and he has been living at the memory care unit since November 2025.   CT-C spine: 1. No acute fracture or listhesis of the cervical spine. 2. Degenerative disc and degenerative joint disease resulting in diffuse moderate neuroforaminal narrowing. 3. Mild emphysema. Emphysema (ICD10-J43.9).     CT HEAD:   1. No acute intracranial abnormality. 2. Postoperative changes from prior left pterional craniotomy for surgical clipping of aneurysm in the region of the left ICA terminus. 3. Right parietal approach VP shunt catheter in place with tip terminating in the right lateral ventricle. Stable ventricular size and morphology without hydrocephalus. 4. Small bilateral subdural collections measuring up to 2 mm on the left and 3 mm on the right, likely related to shunting. No midline shift. 5. Underlying atrophy with chronic small vessel ischemic disease.   CTA HEAD AND NECK:   1. Postoperative  changes from prior aneurysm repair at the left ICA terminus. 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 2. Markedly hypoplastic left vertebral artery, which occludes at the proximal V2 segment. Scant attenuated distal reconstitution at the left V3 segment. Left vertebral artery is grossly patent distally to the vertebrobasilar junction. 3. Atheromatous change about the carotid siphons with resultant moderate stenosis at the supraclinoid right ICA. 4. Focal severe distal right A3 stenosis.   Aortic Atherosclerosis (ICD10-I70.0) and Emphysema (ICD10-J43.9).  Assessment/Plan:   Principal Problem:   Right sided weakness Active Problems:   hx of stroke   Multiple falls   Seizure (HCC)   Essential hypertension   Chronic diastolic CHF (congestive heart failure) (HCC)   Diabetes mellitus with complication (HCC)   Hyperlipidemia with target LDL less than 130   Dementia arising in the senium and presenium (HCC)   Reported right sided weakness, history of stroke:  No acute stroke on CT head.  VP shunt is stable from CT head.  Cannot have MRI brain because of VP shunt. Case discussed with neurologist on-call, Dr. Matthews.  No further evaluation required at this time. Continue aspirin  and Lipitor. 2D echo LVEF 6065%, grade 1 diastolic dysfunction. Venous duplex negative for DVT He can follow-up with outpatient neurologist. PT and OT evaluation. Overall poor prognosis secondary to worsening of dementia.  Goals of care discussed with patient's daughter, palliative care was consulted.  Patient's daughter agreed for hospice care at the memory care unit but continue current treatment during hospital  stay. TOC was informed to provide choices for hospice.   Multiple falls Most likely due to worsening of dementia and ambulatory dysfunction.  B12 deficient -Fall precaution - PT/OT    Seizure  disorder -Seizure precaution - As needed Ativan  for seizure - Continue home Keppra  1000 mg twice daily    Chronic diastolic CHF (congestive heart failure) (HCC): 2D echo 10/30/2022 showed EF of 60 to 65% with grade 1 diastolic dysfunction.  BNP was normal. No evidence of acute exacerbation at this time.   Peripheral edema BNP normal.  Doubt acute CHF exacerbation at this time. Check venous duplex of lower extremities to rule out DVT.   Worsening altered mental status, possible acute metabolic encephalopathy vs progressive dementia  Patient likely has progressive dementia.SABRA He is dependent on ADLs and has had multiple falls recently. Discussed goals of care with Nanette, daughter, at the bedside. She confirmed the patient is DNR    Dysphagia SLP eval done, patient was started on dysphagia 1 diet  Hypertension Continued amlodipine , irbesartan  and hydrochlorothiazide  Use hydralazine . Monitor BP and titrate medication accordingly 8/12 patient had an episode of V. tach last night, continue to monitor on telemetry   Comorbidities include hyperlipidemia, type II DM (recent hemoglobin A1c 6)  Hypophosphatemia, Phos repleted. Hypokalemia, potassium repleted. Monitor electrolytes and replete as needed.  Vitamin B12 deficiency: Started vitamin B12 1000 mcg IM injection daily during hospital stay, followed by oral supplement.  Follow-up PCP to repeat vitamin B12 level after 3 to 6 months.  Body mass index is 22.24 kg/m.  Diet Order             DIET - DYS 1 Room service appropriate? No; Fluid consistency: Nectar Thick  Diet effective now                   Consultants: None, but case was discussed with on-call neurology by previous attending.  Procedures: None    Medications:    amLODipine   10 mg Oral QPM   aspirin  EC  81 mg Oral Daily   atorvastatin   20 mg Oral QPM   brimonidine   1 drop Right Eye BID   And   timolol   1 drop Right Eye BID   enoxaparin   (LOVENOX ) injection  40 mg Subcutaneous Q24H   irbesartan   300 mg Oral Daily   And   hydrochlorothiazide   12.5 mg Oral Daily   insulin  aspart  0-5 Units Subcutaneous QHS   insulin  aspart  0-9 Units Subcutaneous TID WC   levETIRAcetam   1,000 mg Oral BID   Continuous Infusions:     Anti-infectives (From admission, onward)    None              Family Communication/Anticipated D/C date and plan/Code Status   DVT prophylaxis: enoxaparin  (LOVENOX ) injection 40 mg Start: 09/29/23 1000     Code Status: Limited: Do not attempt resuscitation (DNR) -DNR-LIMITED -Do Not Intubate/DNI   Family Communication: Plan discussed with Nanette, daughter, at the bedside Disposition Plan: Plan to discharge to long-term care facility   Status is: Observation The patient will require care spanning > 2 midnights and should be moved to inpatient because: Altered mental status       Subjective:   Last night patient had an episode of V. tach, heart rate was in 140s, converted back to normal sinus rhythm. Patient has significant dementia AO x 1, unable to offer any complaints. Management plan discussed with patient's daughter at bedside   Objective:  Vitals:   09/30/23 0800 09/30/23 0845 09/30/23 1127 09/30/23 1157  BP:  (!) 157/69  (!) 148/66  Pulse:  68  70  Resp:  18  18  Temp:  97.9 F (36.6 C)  97.7 F (36.5 C)  TempSrc:    Oral  SpO2:  100%  99%  Weight:   78.6 kg   Height: 6' 2 (1.88 m)  6' 2.02 (1.88 m)    No data found.   Intake/Output Summary (Last 24 hours) at 09/30/2023 1434 Last data filed at 09/30/2023 1300 Gross per 24 hour  Intake 480 ml  Output 1000 ml  Net -520 ml   Filed Weights   09/30/23 1127  Weight: 78.6 kg    Exam:  GEN: NAD SKIN: Warm and dry EYES: No pallor or icterus ENT: MMM CV: RRR PULM: CTA B ABD: soft, ND, NT, +BS CNS: Bilateral lower extremity rigidity, patient is able to move bilateral lower legs and upper extremities as  well.  No new focal deficit noticed. Extremities: No edema   Data Reviewed:   I have personally reviewed following labs and imaging studies:  Labs: Labs show the following:   Basic Metabolic Panel: Recent Labs  Lab 09/24/23 0913 09/28/23 1954 09/29/23 0614 09/30/23 0714 09/30/23 0905  NA 139 140 142 140  --   K 3.7 3.6 3.5 3.3*  --   CL 106 104 107 103  --   CO2 25 28 29 27   --   GLUCOSE 89 97 89 97  --   BUN 19 17 14 11   --   CREATININE 0.71 0.76 0.70 0.64  --   CALCIUM  9.1 9.1 9.0 9.0  --   MG  --   --   --  2.1  --   PHOS  --   --   --   --  2.2*   GFR Estimated Creatinine Clearance: 72.3 mL/min (by C-G formula based on SCr of 0.64 mg/dL). Liver Function Tests: Recent Labs  Lab 09/24/23 0913 09/28/23 1954  AST 16 16  ALT 10 10  ALKPHOS 53 54  BILITOT 1.1 1.3*  PROT 6.9 6.2*  ALBUMIN 3.9 3.4*   No results for input(s): LIPASE, AMYLASE in the last 168 hours. No results for input(s): AMMONIA in the last 168 hours. Coagulation profile No results for input(s): INR, PROTIME in the last 168 hours.  CBC: Recent Labs  Lab 09/24/23 0913 09/28/23 1954 09/29/23 0614 09/30/23 0714  WBC 4.7 4.3 4.2 5.0  NEUTROABS 2.1  --   --   --   HGB 13.8 12.6* 12.0* 12.8*  HCT 41.1 37.3* 36.3* 39.2  MCV 86.3 86.5 86.6 87.1  PLT 224 206 223 235   Cardiac Enzymes: Recent Labs  Lab 09/30/23 0905  CKTOTAL 200   BNP (last 3 results) No results for input(s): PROBNP in the last 8760 hours. CBG: Recent Labs  Lab 09/29/23 1229 09/29/23 1519 09/29/23 1952 09/30/23 0844 09/30/23 1159  GLUCAP 83 95 135* 92 113*   D-Dimer: No results for input(s): DDIMER in the last 72 hours. Hgb A1c: Recent Labs    09/28/23 1954  HGBA1C 5.5   Lipid Profile: Recent Labs    09/29/23 0614  CHOL 170  HDL 74  LDLCALC 90  TRIG 30  CHOLHDL 2.3   Thyroid  function studies: Recent Labs    09/30/23 0905  TSH 1.757   Anemia work up: No results for input(s):  VITAMINB12, FOLATE, FERRITIN, TIBC, IRON, RETICCTPCT in the  last 72 hours. Sepsis Labs: Recent Labs  Lab 09/24/23 0913 09/28/23 1954 09/29/23 0614 09/30/23 0714  WBC 4.7 4.3 4.2 5.0    Microbiology No results found for this or any previous visit (from the past 240 hours).  Procedures and diagnostic studies:  US  Venous Img Lower Bilateral (DVT) Result Date: 09/30/2023 CLINICAL DATA:  Bilateral lower extremity edema. EXAM: BILATERAL LOWER EXTREMITY VENOUS DOPPLER ULTRASOUND TECHNIQUE: Gray-scale sonography with graded compression, as well as color Doppler and duplex ultrasound were performed to evaluate the lower extremity deep venous systems from the level of the common femoral vein and including the common femoral, femoral, profunda femoral, popliteal and calf veins including the posterior tibial, peroneal and gastrocnemius veins when visible. The superficial great saphenous vein was also interrogated. Spectral Doppler was utilized to evaluate flow at rest and with distal augmentation maneuvers in the common femoral, femoral and popliteal veins. COMPARISON:  None Available. FINDINGS: RIGHT LOWER EXTREMITY Common Femoral Vein: No evidence of thrombus. Normal compressibility, respiratory phasicity and response to augmentation. Saphenofemoral Junction: No evidence of thrombus. Normal compressibility and flow on color Doppler imaging. Profunda Femoral Vein: No evidence of thrombus. Normal compressibility and flow on color Doppler imaging. Femoral Vein: No evidence of thrombus. Normal compressibility, respiratory phasicity and response to augmentation. Popliteal Vein: No evidence of thrombus. Normal compressibility, respiratory phasicity and response to augmentation. Calf Veins: No evidence of thrombus. Normal compressibility and flow on color Doppler imaging. Superficial Great Saphenous Vein: No evidence of thrombus. Normal compressibility. Venous Reflux:  None. Other Findings: No evidence  of superficial thrombophlebitis or abnormal fluid collection. LEFT LOWER EXTREMITY Common Femoral Vein: No evidence of thrombus. Normal compressibility, respiratory phasicity and response to augmentation. Saphenofemoral Junction: No evidence of thrombus. Normal compressibility and flow on color Doppler imaging. Profunda Femoral Vein: No evidence of thrombus. Normal compressibility and flow on color Doppler imaging. Femoral Vein: No evidence of thrombus. Normal compressibility, respiratory phasicity and response to augmentation. Popliteal Vein: No evidence of thrombus. Normal compressibility, respiratory phasicity and response to augmentation. Calf Veins: No evidence of thrombus. Normal compressibility and flow on color Doppler imaging. Superficial Great Saphenous Vein: No evidence of thrombus. Normal compressibility. Venous Reflux:  None. Other Findings: No evidence of superficial thrombophlebitis or abnormal fluid collection. IMPRESSION: No evidence of deep venous thrombosis in either lower extremity. Electronically Signed   By: Marcey Moan M.D.   On: 09/30/2023 08:21   ECHOCARDIOGRAM COMPLETE Result Date: 09/29/2023    ECHOCARDIOGRAM REPORT   Patient Name:   HEAVEN WANDELL Date of Exam: 09/29/2023 Medical Rec #:  [id number]        Height:       74.0 in Accession #:    [id number]       Weight:       176.8 lb Date of Birth:  1935-02-07         BSA:          2.063 m Patient Age:    89 years         BP:           157/71 mmHg Patient Gender: M                HR:           67 bpm. Exam Location:  [facility] Procedure: 2D Echo, Cardiac Doppler and Color Doppler (Both Spectral and Color            Flow Doppler were utilized during procedure). Indications:  Stroke I63.9  History:         Patient has prior history of Echocardiogram examinations, most                  recent 10/30/2022. CHF, Stroke, Signs/Symptoms:Syncope; Risk                  Factors:Hypertension, Diabetes and Dyslipidemia.  Sonographer:     Thea Norlander RCS Referring Phys:  [id number] XILIN NIU Diagnosing Phys: Marsa Dooms MD  Sonographer Comments: Image acquisition challenging due to patient body habitus. IMPRESSIONS  1. Left ventricular ejection fraction, by estimation, is 60 to 65%. The left ventricle has normal function. The left ventricle has no regional wall motion abnormalities. Left ventricular diastolic parameters are consistent with Grade I diastolic dysfunction (impaired relaxation).  2. Right ventricular systolic function is normal. The right ventricular size is normal.  3. The mitral valve is normal in structure. Mild mitral valve regurgitation. No evidence of mitral stenosis.  4. The aortic valve is normal in structure. Aortic valve regurgitation is not visualized. No aortic stenosis is present.  5. The inferior vena cava is normal in size with greater than 50% respiratory variability, suggesting right atrial pressure of 3 mmHg. FINDINGS  Left Ventricle: Left ventricular ejection fraction, by estimation, is 60 to 65%. The left ventricle has normal function. The left ventricle has no regional wall motion abnormalities. Strain was performed and the global longitudinal strain is indeterminate. The left ventricular internal cavity size was normal in size. There is no left ventricular hypertrophy. Left ventricular diastolic parameters are consistent with Grade I diastolic dysfunction (impaired relaxation). Right Ventricle: The right ventricular size is normal. No increase in right ventricular wall thickness. Right ventricular systolic function is normal. Left Atrium: Left atrial size was normal in size. Right Atrium: Right atrial size was normal in size. Pericardium: There is no evidence of pericardial effusion. Mitral Valve: The mitral valve is normal in structure. Mild mitral valve regurgitation. No evidence of mitral valve stenosis. Tricuspid Valve: The tricuspid valve is normal in structure. Tricuspid valve regurgitation is mild . No  evidence of tricuspid stenosis. Aortic Valve: The aortic valve is normal in structure. Aortic valve regurgitation is not visualized. No aortic stenosis is present. Aortic valve peak gradient measures 4.4 mmHg. Pulmonic Valve: The pulmonic valve was normal in structure. Pulmonic valve regurgitation is not visualized. No evidence of pulmonic stenosis. Aorta: The aortic root is normal in size and structure. Venous: The inferior vena cava is normal in size with greater than 50% respiratory variability, suggesting right atrial pressure of 3 mmHg. IAS/Shunts: No atrial level shunt detected by color flow Doppler. Additional Comments: 3D was performed not requiring image post processing on an independent workstation and was indeterminate.  LEFT VENTRICLE PLAX 2D LVIDd:         4.50 cm   Diastology LVIDs:         3.00 cm   LV e' medial:    8.70 cm/s LV PW:         1.00 cm   LV E/e' medial:  8.9 LV IVS:        0.80 cm   LV e' lateral:   10.60 cm/s LVOT diam:     2.30 cm   LV E/e' lateral: 7.3 LV SV:         85 LV SV Index:   41 LVOT Area:     4.15 cm  RIGHT VENTRICLE  IVC RV S prime:     11.50 cm/s  IVC diam: 2.35 cm TAPSE (M-mode): 2.2 cm LEFT ATRIUM             Index        RIGHT ATRIUM           Index LA diam:        3.90 cm 1.89 cm/m   RA Area:     17.30 cm LA Vol (A2C):   53.9 ml 26.13 ml/m  RA Volume:   42.30 ml  20.51 ml/m LA Vol (A4C):   45.1 ml 21.87 ml/m LA Biplane Vol: 52.0 ml 25.21 ml/m  AORTIC VALVE AV Area (Vmax): 4.31 cm AV Vmax:        105.00 cm/s AV Peak Grad:   4.4 mmHg LVOT Vmax:      109.00 cm/s LVOT Vmean:     67.600 cm/s LVOT VTI:       0.204 m  AORTA Ao Root diam: 3.90 cm Ao Asc diam:  3.70 cm MITRAL VALVE                TRICUSPID VALVE MV Area (PHT): 2.99 cm     TR Peak grad:   21.9 mmHg MV Decel Time: 254 msec     TR Vmax:        234.00 cm/s MR Peak grad: 102.8 mmHg MR Vmax:      507.00 cm/s   SHUNTS MV E velocity: 77.80 cm/s   Systemic VTI:  0.20 m MV A velocity: 130.00 cm/s   Systemic Diam: 2.30 cm MV E/A ratio:  0.60 Marsa Dooms MD Electronically signed by Marsa Dooms MD Signature Date/Time: 09/29/2023/1:35:42 PM    Final    CT Angio Head Neck W WO CM Result Date: 09/28/2023 CLINICAL DATA:  Initial evaluation for history of aneurysm. EXAM: CT ANGIOGRAPHY HEAD AND NECK WITH AND WITHOUT CONTRAST TECHNIQUE: Multidetector CT imaging of the head and neck was performed using the standard protocol during bolus administration of intravenous contrast. Multiplanar CT image reconstructions and MIPs were obtained to evaluate the vascular anatomy. Carotid stenosis measurements (when applicable) are obtained utilizing NASCET criteria, using the distal internal carotid diameter as the denominator. RADIATION DOSE REDUCTION: This exam was performed according to the departmental dose-optimization program which includes automated exposure control, adjustment of the mA and/or kV according to patient size and/or use of iterative reconstruction technique. CONTRAST:  75mL OMNIPAQUE  IOHEXOL  350 MG/ML SOLN COMPARISON:  CT from 09/24/2023 FINDINGS: CT HEAD FINDINGS Brain: Postoperative changes from prior left pterional craniotomy for surgical clipping of aneurysm in the region of the left ICA terminus. Chronic encephalomalacia of involving the left parietal and temporal lobes. Underlying atrophy with chronic small vessel ischemic disease. Right parietal approach VP shunt catheter in place with tip terminating in the right lateral ventricle. Stable ventricular size and morphology without hydrocephalus. Small bilateral subdural collections measuring up to 2 mm on the left and 3 mm on the right, likely related to shunting, and similar to prior. No midline shift. No acute large vessel territory infarct. No acute intracranial hemorrhage. No mass lesion. Vascular: No abnormal hyperdense vessel. Calcified atherosclerosis present at the skull base. Skull: Scalp soft tissues demonstrate no acute  finding. Prior left pterional craniotomy. Right-sided VP shunt catheter in place. Sinuses/Orbits: Globes orbital soft tissues within normal limits. Paranasal sinuses are largely clear. No mastoid effusion. Other: None. Review of the MIP images confirms the above findings CTA NECK FINDINGS Aortic arch: Visualized aortic  arch within normal limits for caliber with standard 3 vessel morphology. Aortic atherosclerosis. No significant stenosis about the origin the great vessels. Right carotid system: Right common and internal carotid arteries are patent without dissection. Atheromatous change about the right carotid bulb without hemodynamically significant greater than 50% stenosis. Left carotid system: Left common and internal carotid arteries are patent without dissection. Atheromatous change about the left carotid bulb without hemodynamically significant greater than 50% stenosis. Vertebral arteries: Both vertebral arteries arise from subclavian arteries. Right vertebral artery dominant and patent without significant stenosis or dissection. Left vertebral artery markedly hypoplastic and patent at its origin, but occludes at the proximal V2 segment. Scant attenuated distal reconstitution at the left V3 segment (series 6, image 162). Left vertebral artery is patent as it crosses into the cranial vault. Skeleton: No worrisome osseous lesions. Moderate spondylosis at C5-6 through C7-T1. Patient is edentulous. Other neck: No other acute finding. Upper chest: Mild emphysema.  No other acute finding. Review of the MIP images confirms the above findings CTA HEAD FINDINGS Anterior circulation: Atheromatous change seen about the carotid siphons bilaterally. Resultant moderate stenosis at the supraclinoid right ICA (series 6, image 96). Postoperative changes from prior aneurysm repair at the left ICA terminus. There is fusiform aneurysmal dilatation of the underlying left ICA terminus/proximal left M1 segment, measuring up to 5  mm in transverse diameter (series 6, image 95). A1 segments patent bilaterally. Normal anterior communicating artery complex. Left ACA patent without stenosis. Focal severe distal right 8 3 stenosis (series 11, image 22). Right M1 segment patent without stenosis. Focus of calcified plaque noted within the distal left M1 segment without significant stenosis. No proximal MCA branch occlusion. Small vessel atheromatous irregularity noted throughout the MCA branches bilaterally. Posterior circulation: Dominant right V4 segment patent without significant stenosis. Right PICA patent at its origin. Left vertebral artery markedly hypoplastic and attenuated but grossly patent to the vertebrobasilar junction. Left PICA not seen. Basilar patent without stenosis. Superior cerebral arteries patent bilaterally. Predominant fetal type origin of the PCAs bilaterally. Both PCAs patent to their distal aspects without significant stenosis. Venous sinuses: Not well assessed due to arterial timing the contrast bolus. Anatomic variants: As above. Review of the MIP images confirms the above findings IMPRESSION: CT HEAD: 1. No acute intracranial abnormality. 2. Postoperative changes from prior left pterional craniotomy for surgical clipping of aneurysm in the region of the left ICA terminus. 3. Right parietal approach VP shunt catheter in place with tip terminating in the right lateral ventricle. Stable ventricular size and morphology without hydrocephalus. 4. Small bilateral subdural collections measuring up to 2 mm on the left and 3 mm on the right, likely related to shunting. No midline shift. 5. Underlying atrophy with chronic small vessel ischemic disease. CTA HEAD AND NECK: 1. Postoperative changes from prior aneurysm repair at the left ICA terminus. 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 2. Markedly hypoplastic left vertebral  artery, which occludes at the proximal V2 segment. Scant attenuated distal reconstitution at the left V3 segment. Left vertebral artery is grossly patent distally to the vertebrobasilar junction. 3. Atheromatous change about the carotid siphons with resultant moderate stenosis at the supraclinoid right ICA. 4. Focal severe distal right A3 stenosis. Aortic Atherosclerosis (ICD10-I70.0) and Emphysema (ICD10-J43.9). Electronically Signed   By: Morene Hoard M.D.   On: 09/28/2023 23:34   DG Cervical Spine 1 View Result Date: 09/28/2023 CLINICAL DATA:  evaluate for shunt malfunction; [id number] AMS (altered mental status) [id number] EXAM: CHEST  1 VIEW; DG CERVICAL SPINE - 1 VIEW; ABDOMEN - 1 VIEW COMPARISON:  None Available. FINDINGS: The heart and mediastinal contours are within normal limits. No focal consolidation. No pulmonary edema. No pleural effusion. No pneumothorax. Excreted previously administered intravenous contrast from bilateral collecting systems. Nonobstructive bowel gas pattern. No acute osseous abnormality. No kinking or discontinuity of the right ventriculoperitoneal shunt noted. Tip overlies the right mid abdomen. IMPRESSION: 1. Negative frontal view C-spine, chest, abdomen pelvis. 2. Right ventriculoperitoneal shunt with no discontinuity or kinking. Electronically Signed   By: Morgane  Naveau M.D.   On: 09/28/2023 21:51   DG Chest 1 View Result Date: 09/28/2023 CLINICAL DATA:  evaluate for shunt malfunction; [id number] AMS (altered mental status) [id number] EXAM: CHEST  1 VIEW; DG CERVICAL SPINE - 1 VIEW; ABDOMEN - 1 VIEW COMPARISON:  None Available. FINDINGS: The heart and mediastinal contours are within normal limits. No focal consolidation. No pulmonary edema. No pleural effusion. No pneumothorax. Excreted previously administered intravenous contrast from bilateral collecting systems. Nonobstructive bowel gas pattern. No acute osseous abnormality. No kinking or discontinuity of the right  ventriculoperitoneal shunt noted. Tip overlies the right mid abdomen. IMPRESSION: 1. Negative frontal view C-spine, chest, abdomen pelvis. 2. Right ventriculoperitoneal shunt with no discontinuity or kinking. Electronically Signed   By: Morgane  Naveau M.D.   On: 09/28/2023 21:51   DG Abd 1 View Result Date: 09/28/2023 CLINICAL DATA:  evaluate for shunt malfunction; [id number] AMS (altered mental status) [id number] EXAM: CHEST  1 VIEW; DG CERVICAL SPINE - 1 VIEW; ABDOMEN - 1 VIEW COMPARISON:  None Available. FINDINGS: The heart and mediastinal contours are within normal limits. No focal consolidation. No pulmonary edema. No pleural effusion. No pneumothorax. Excreted previously administered intravenous contrast from bilateral collecting systems. Nonobstructive bowel gas pattern. No acute osseous abnormality. No kinking or discontinuity of the right ventriculoperitoneal shunt noted. Tip overlies the right mid abdomen. IMPRESSION: 1. Negative frontal view C-spine, chest, abdomen pelvis. 2. Right ventriculoperitoneal shunt with no discontinuity or kinking. Electronically Signed   By: Morgane  Naveau M.D.   On: 09/28/2023 21:51   DG Skull 1-3 Views Result Date: 09/28/2023 CLINICAL DATA:  Evaluate for shunt malfunction EXAM: SKULL - 1-3 VIEW COMPARISON:  Head CT 09/24/2023. FINDINGS: VP shunt catheter is visualized in the right parietal region extending into the left neck. The visualized portion of the catheter appears intact. Temporal craniotomy again noted. No acute fractures. IMPRESSION: VP shunt catheter is visualized in the right parietal region extending into the left neck. Catheter appears intact. Electronically Signed   By: Greig Pique M.D.   On: 09/28/2023 21:49   CT Cervical Spine Wo Contrast Result Date: 09/28/2023 CLINICAL DATA:  Neck trauma (Age >= 65y), dementia EXAM: CT CERVICAL SPINE WITHOUT CONTRAST TECHNIQUE: Multidetector CT imaging of the cervical spine was performed without intravenous contrast.  Multiplanar CT image reconstructions were also generated. RADIATION DOSE REDUCTION: This exam was performed according to the departmental dose-optimization program which includes automated exposure control, adjustment of the mA and/or kV according to patient size and/or use of iterative reconstruction technique. COMPARISON:  None Available. FINDINGS: Alignment: Normal. Skull base and vertebrae: Grade cervical alignment is normal. The atlantodental interval is not widened. No acute fracture of the cervical spine. Vertebral body height is preserved. Soft tissues and spinal canal: No prevertebral fluid or swelling. No visible canal hematoma. Disc levels: Disc space narrowing and endplate remodeling is seen within the  cervical spine at C4-T1 in keeping with changes moderate degenerative disc disease. Prevertebral soft tissues are not thickened on sagittal reformats. Spinal is widely patent. Uncovertebral and facet arthrosis results in diffuse moderate neuroforaminal narrowing Upper chest: Mild emphysema. Other: Ventriculoperitoneal shunt catheter tubing is seen within the right cervical soft tissues. IMPRESSION: 1. No acute fracture or listhesis of the cervical spine. 2. Degenerative disc and degenerative joint disease resulting in diffuse moderate neuroforaminal narrowing. 3. Mild emphysema. Emphysema (ICD10-J43.9). Electronically Signed   By: Dorethia Molt M.D.   On: 09/28/2023 21:48     Total time spent 55 minutes   LOS: 0 days   Trenise Turay Von  [facility]   Pager on [email or web address]. If 7PM-7AM, please contact night-coverage at [email or web address]     09/30/2023, 2:34 PM

## 2023-09-30 NOTE — Plan of Care (Signed)
   Problem: Activity: Goal: Risk for activity intolerance will decrease Outcome: Progressing   Problem: Nutrition: Goal: Adequate nutrition will be maintained Outcome: Progressing   Problem: Pain Managment: Goal: General experience of comfort will improve and/or be controlled Outcome: Progressing

## 2023-09-30 NOTE — Progress Notes (Signed)
       CROSS COVER NOTE  NAME: Norrin Shreffler MRN: [id number] DOB : 01/03/1935    Concern as stated by nurse / staff   HE had 13 beats of VT and HR went into 140s he's currently back in SR      Pertinent findings on chart review:   Patient Assessment   Assessment and  Interventions   Assessment:  NSVT  Plan: Check K and Mg and correct if needed  X X

## 2023-10-01 DIAGNOSIS — Z515 Encounter for palliative care: Secondary | ICD-10-CM | POA: Diagnosis not present

## 2023-10-01 DIAGNOSIS — R531 Weakness: Secondary | ICD-10-CM | POA: Diagnosis not present

## 2023-10-01 DIAGNOSIS — R569 Unspecified convulsions: Secondary | ICD-10-CM | POA: Diagnosis not present

## 2023-10-01 DIAGNOSIS — R296 Repeated falls: Secondary | ICD-10-CM | POA: Diagnosis not present

## 2023-10-01 DIAGNOSIS — F0390 Unspecified dementia without behavioral disturbance: Secondary | ICD-10-CM | POA: Diagnosis not present

## 2023-10-01 LAB — CBC
HCT: 36.9 % — ABNORMAL LOW (ref 39.0–52.0)
Hemoglobin: 12.7 g/dL — ABNORMAL LOW (ref 13.0–17.0)
MCH: 29.2 pg (ref 26.0–34.0)
MCHC: 34.4 g/dL (ref 30.0–36.0)
MCV: 84.8 fL (ref 80.0–100.0)
Platelets: 212 K/uL (ref 150–400)
RBC: 4.35 MIL/uL (ref 4.22–5.81)
RDW: 13.9 % (ref 11.5–15.5)
WBC: 5 K/uL (ref 4.0–10.5)
nRBC: 0 % (ref 0.0–0.2)

## 2023-10-01 LAB — BASIC METABOLIC PANEL WITH GFR
Anion gap: 9 (ref 5–15)
BUN: 12 mg/dL (ref 8–23)
CO2: 28 mmol/L (ref 22–32)
Calcium: 8.9 mg/dL (ref 8.9–10.3)
Chloride: 103 mmol/L (ref 98–111)
Creatinine, Ser: 0.75 mg/dL (ref 0.61–1.24)
GFR, Estimated: >60 mL/min (ref >60)
Glucose, Bld: 147 mg/dL — ABNORMAL HIGH (ref 70–99)
Potassium: 3 mmol/L — ABNORMAL LOW (ref 3.5–5.1)
Sodium: 140 mmol/L (ref 135–145)

## 2023-10-01 LAB — GLUCOSE, CAPILLARY
Glucose-Capillary: 98 mg/dL (ref 70–99)
Glucose-Capillary: 141 mg/dL — ABNORMAL HIGH (ref 70–99)
Glucose-Capillary: 106 mg/dL — ABNORMAL HIGH (ref 70–99)
Glucose-Capillary: 168 mg/dL — ABNORMAL HIGH (ref 70–99)

## 2023-10-01 LAB — PHOSPHORUS: Phosphorus: 2.8 mg/dL (ref 2.5–4.6)

## 2023-10-01 LAB — MAGNESIUM: Magnesium: 1.9 mg/dL (ref 1.7–2.4)

## 2023-10-01 MED ORDER — ISOSORBIDE MONONITRATE ER 30 MG PO TB24
30.0000 mg | ORAL_TABLET | Freq: Every day | ORAL | Status: DC
  Administered 2023-10-01 – 2023-10-02 (×3): 30 mg via ORAL
  Filled 2023-10-01 (×2): qty 1

## 2023-10-01 MED ORDER — THIAMINE MONONITRATE 100 MG PO TABS
100.0000 mg | ORAL_TABLET | Freq: Every day | ORAL | Status: DC
  Administered 2023-10-01 – 2023-10-02 (×3): 100 mg via ORAL
  Filled 2023-10-01 (×2): qty 1

## 2023-10-01 MED ORDER — POTASSIUM CHLORIDE 20 MEQ PO PACK
40.0000 meq | PACK | ORAL | Status: AC
  Administered 2023-10-01 (×4): 40 meq via ORAL
  Filled 2023-10-01 (×2): qty 2

## 2023-10-01 MED ORDER — QUETIAPINE FUMARATE 25 MG PO TABS
25.0000 mg | ORAL_TABLET | Freq: Every evening | ORAL | Status: AC
  Administered 2023-10-01 (×2): 25 mg via ORAL
  Filled 2023-10-01: qty 1

## 2023-10-01 NOTE — Progress Notes (Signed)
 Palliative Care Progress Note, Assessment & Plan   Patient Name: William Mills       Date: 10/01/2023 DOB: 1934/07/18  Age: 89 y.o. MRN#: [id number] Attending Physician: Von Bellis, MD Primary Care Physician: Merilee Setter, NP Admit Date: 09/28/2023  Subjective: Patient is lying in bed, resting male distress.  He does not awaken to my presence.  His daughter William Mills is present at bedside during my visit.   HPI: 89 y.o. male  with past medical history of stroke, s/p of brain aneurysm repair, s/p VP shunt, left eye blindness, seizure,dCHF, dementia  admitted on 09/28/2023 with multiple falls, right-sided, and confusion.  CT of C-spine revealed no acute fractures.  CT of head revealed no acute intracranial abnormalities.  Neurology was consulted and no further evaluation is at this time.   Patient is being treated for altered mental status with possible acute metabolic encephalopathy but more likely due to progressive dementia.   PMT was consulted to support patient and family with goals of care discussions.  Summary of counseling/coordination of care: Extensive chart review completed prior to meeting patient including labs, vital signs, imaging, progress notes, orders, and available advanced directive documents from current and previous encounters.   After reviewing the patient's chart and assessing the patient at bedside, I spoke with patient and his daughter in regards to symptom management and goals of care.   Symptoms assessed. Pt has no non verbal signs of distress or discomfort. He remains nonverbal, unable to participate in GOC or medical decision making.   Daughter endorses he has been resting comfortably with no signs of discomfort. No adjustment to [facility] needed.  I again discussed hopes and  goals with William Mills. She remains in agreement for patient to return to [facility] with Hospice services to follow. She has already been in touch with [facility] in regards to setting up hospice once patient returns.   Discussed patient's poor functional status and inability to walk or get OOB. William Mills is accepting that he cannot walk but is concerned about bed sores and skin breakdown from lack of movement. Education provided on importance of keeping patient clean, dry, and shifting/turning every 2 hours or more. She shares understanding and is hopeful that the [facility] staff and hospice team can help prevent skin breakdown.   Symptom burden is low.  Goals are clear.  PMT will step back from daily visits and monitor the patient peripherally.  Please re-engage with PMT if goals change, at patient/family's request, or if patient's health deteriorates during hospitalization.    Physical Exam Vitals reviewed.  Constitutional:      General: He is not in acute distress.    Appearance: He is normal weight.  HENT:     Head: Normocephalic.  Pulmonary:     Effort: Pulmonary effort is normal.  Abdominal:     Palpations: Abdomen is soft.  Musculoskeletal:     Comments: Generalized weakness  Neurological:     Comments: nonverbal  Psychiatric:        Behavior: Behavior normal.             Total Time 25 minutes   Time spent includes: Detailed review of medical records (labs, imaging,  vital signs), medically appropriate exam (mental status, respiratory, cardiac, skin), discussed with treatment team, counseling and educating patient, family and staff, documenting clinical information, medication management and coordination of care.  [name], DNP, FNP-BC Palliative Medicine Team

## 2023-10-01 NOTE — Plan of Care (Signed)

## 2023-10-01 NOTE — Plan of Care (Signed)
   Problem: Activity: Goal: Risk for activity intolerance will decrease Outcome: Progressing   Problem: Nutrition: Goal: Adequate nutrition will be maintained Outcome: Progressing   Problem: Safety: Goal: Ability to remain free from injury will improve Outcome: Progressing   Problem: Skin Integrity: Goal: Risk for impaired skin integrity will decrease Outcome: Progressing

## 2023-10-01 NOTE — Progress Notes (Signed)
 Occupational Therapy Treatment Patient Details Name: William Mills MRN: [id number] DOB: [birth year] Today's Date: 10/01/2023   History of present illness Pt is a 89 y.o. male who presents with multiple falls, right-sided weakness, confusion. PMH of stroke, s/p of brain aneurysm repair, s/p VP shunt, left eye blindness, seizure, dCHF, dementia   OT comments  William Mills is able to provide both his first and last name this AM. He is not oriented to place, time, or situation. He is, however, able to engage in other topics of conversation. When therapist mentions that pt has large feet, he immediately says, size 13! and then goes on to say that he liked football, basketball, and running. Pt is able to perform UE and LE therex in sitting in bed; he could accurately reach out to grab pt's hand in various positions and could largely follow instructions to reach out with his left and his right hand. He appeared to enjoy this activity. Asked if he had any pain this date, pt gestures to his R underarm area, but is unable to provide a verbal description of his pain. He is able to indicate the he is hungry. Therapist assists pt into seated position in bed for eating, with NT present to feed pt. Given pt's apparent enjoyment of reaching out with his upper extremities, arranged for a therapy dog to visit this pt, to hopefully allow pt to pet/stroke dog if he so desired. Will continue to assist this pt with movement within his ability level.      If plan is discharge home, recommend the following:  A lot of help with walking and/or transfers;A lot of help with bathing/dressing/bathroom;Assistance with feeding   Equipment Recommendations  None recommended by OT    Recommendations for Other Services      Precautions / Restrictions Precautions Precautions: Fall Recall of Precautions/Restrictions: Impaired Restrictions Weight Bearing Restrictions Per Provider Order: No       Mobility Bed  Mobility Overal bed mobility: Needs Assistance Bed Mobility: Supine to Sit, Sit to Supine     Supine to sit: Max assist Sit to supine: Max assist   General bed mobility comments: requires ongoing gestural and tactile cueing, unable to come into full sitting posture    Transfers                   General transfer comment: unable     Balance Overall balance assessment: Needs assistance Sitting-balance support: Bilateral upper extremity supported, Feet supported Sitting balance-Leahy Scale: Poor       Standing balance-Leahy Scale: Zero                             ADL either performed or assessed with clinical judgement   ADL                                         General ADL Comments: Anticipate pt would require Mod-Max A with all BADL    Extremity/Trunk Assessment Upper Extremity Assessment Upper Extremity Assessment: Generalized weakness   Lower Extremity Assessment Lower Extremity Assessment: Generalized weakness        Vision   Additional Comments: blind in L eye   Perception     Praxis     Communication Communication Communication: Impaired Factors Affecting Communication: Difficulty expressing self;Reduced clarity of speech   Cognition Arousal: Alert Behavior  During Therapy: WFL for tasks assessed/performed Cognition: History of cognitive impairments                               Following commands: Impaired Following commands impaired: Follows one step commands inconsistently, Follows one step commands with increased time      Cueing   Cueing Techniques: Verbal cues, Gestural cues, Tactile cues  Exercises Other Exercises Other Exercises: UE and LE therex in sitting. Pt is able to complete with ongoing tactile cueing, and appears to enjoy. Provided cognitive reorientation.    Shoulder Instructions       General Comments      Pertinent Vitals/ Pain       Pain Assessment Pain Assessment:   (pt gestures that he is having pain in his R underarm) Breathing: normal Negative Vocalization: none Facial Expression: sad, frightened, frown Body Language: tense, distressed pacing, fidgeting Consolability: distracted or reassured by voice/touch PAINAD Score: 3 Pain Intervention(s): Repositioned, Utilized relaxation techniques  Home Living                                          Prior Functioning/Environment              Frequency  Min 1X/week        Progress Toward Goals  OT Goals(current goals can now be found in the care plan section)        Plan      Co-evaluation                 AM-PAC OT 6 Clicks Daily Activity     Outcome Measure   Help from another person eating meals?: A Lot Help from another person taking care of personal grooming?: A Lot Help from another person toileting, which includes using toliet, bedpan, or urinal?: A Lot Help from another person bathing (including washing, rinsing, drying)?: A Lot Help from another person to put on and taking off regular upper body clothing?: A Lot Help from another person to put on and taking off regular lower body clothing?: A Lot 6 Click Score: 12    End of Session    OT Visit Diagnosis: Other abnormalities of gait and mobility (R26.89);Unsteadiness on feet (R26.81);Muscle weakness (generalized) (M62.81)   Activity Tolerance Patient limited by lethargy (limited 2/2 dementia)   Patient Left in bed;with call bell/phone within reach;with bed alarm set;with nursing/sitter in room   Nurse Communication          Time: 9087-9068 OT Time Calculation (min): 19 min  Charges: OT General Charges $OT Visit: 1 Visit OT Treatments $Self Care/Home Management : 8-22 mins  Suzen Hock, PhD, MS, OTR/L 10/01/23, 10:13 AM

## 2023-10-01 NOTE — Progress Notes (Signed)
 Progress Note    William Mills  FMW:[id number] DOB: April 07, 1934  DOA: 09/28/2023 PCP: Merilee Setter, NP      Brief Narrative:    Medical records reviewed and are as summarized below:  William Mills is a 89 y.o. male  with medical history significant of stroke, s/p of brain aneurysm repair, s/p VP shunt, left eye blindness, seizure,dCHF, dementia, who presents with multiple falls, right-sided weakness, confusion.   Per her daughter at William bedside, at her normal baseline, William Mills recognize her, sometimes knows William place, usually not know William time.  In William past 2 weeks, William Mills has been more confused.  Today William Mills is not oriented x 3.  William Mills normally can walk with assistance, but now seems to have right-sided weakness, cannot walk any more.  Sometimes William Mills is leaning to William left side. William Mills has had multiple falls, no significant injury.  Daughter reported recent fall about 5 days prior to admission.  William Mills recently relocated from [place]  back to [place]  in September 2025 and William Mills has been living at [facility] memory care unit since November 2025.   CT-C spine: 1. No acute fracture or listhesis of William cervical spine. 2. Degenerative disc and degenerative joint disease resulting in diffuse moderate neuroforaminal narrowing. 3. Mild emphysema. Emphysema (ICD10-J43.9).     CT HEAD:   1. No acute intracranial abnormality. 2. Postoperative changes from prior left pterional craniotomy for surgical clipping of aneurysm in William region of William left ICA terminus. 3. Right parietal approach VP shunt catheter in place with tip terminating in William right lateral ventricle. Stable ventricular size and morphology without hydrocephalus. 4. Small bilateral subdural collections measuring up to 2 mm on William left and 3 mm on William right, likely related to shunting. No midline shift. 5. Underlying atrophy with chronic small vessel ischemic disease.   CTA HEAD AND NECK:   1. Postoperative  changes from prior aneurysm repair at William left ICA terminus. There is fusiform aneurysmal dilatation of William underlying left ICA terminus/proximal left M1 segment, measuring up to 5 mm in transverse diameter. 2. Markedly hypoplastic left vertebral artery, which occludes at William proximal V2 segment. Scant attenuated distal reconstitution at William left V3 segment. Left vertebral artery is grossly patent distally to William vertebrobasilar junction. 3. Atheromatous change about William carotid siphons with resultant moderate stenosis at William supraclinoid right ICA. 4. Focal severe distal right A3 stenosis.   Aortic Atherosclerosis (ICD10-I70.0) and Emphysema (ICD10-J43.9).  Assessment/Plan:   Principal Problem:   Right sided weakness Active Problems:   hx of stroke   Multiple falls   Seizure (HCC)   Essential hypertension   Chronic diastolic CHF (congestive heart failure) (HCC)   Diabetes mellitus with complication (HCC)   Hyperlipidemia with target LDL less than 130   Dementia arising in William senium and presenium (HCC)   Reported right sided weakness, history of stroke:  No acute stroke on CT head.  VP shunt is stable from CT head.  Cannot have MRI brain because of VP shunt. Case discussed with neurologist on-call, Dr. Matthews.  No further evaluation required at this time. Continue aspirin  and Lipitor. 2D echo LVEF 6065%, grade 1 diastolic dysfunction. Venous duplex negative for DVT William Mills can follow-up with outpatient neurologist. PT and OT evaluation. Overall poor prognosis secondary to worsening of dementia.  Goals of care discussed with William Mills's daughter, palliative care was consulted.  William Mills's daughter agreed for hospice care at [facility] memory care unit but continue current treatment during hospital  stay. TOC was informed to provide choices for hospice.   Multiple falls Most likely due to worsening of dementia and ambulatory dysfunction.  B12 deficient -Fall precaution - PT/OT    Seizure  disorder -Seizure precaution - As needed Ativan  for seizure - Continue home Keppra  1000 mg twice daily    Chronic diastolic CHF (congestive heart failure) (HCC): 2D echo 10/30/2022 showed EF of 60 to 65% with grade 1 diastolic dysfunction.  BNP was normal. No evidence of acute exacerbation at this time.   Peripheral edema, resolved BNP normal.  No clinical signs of CHF exacerbation Venous duplex negative for DVT    Worsening altered mental status, possible acute metabolic encephalopathy vs progressive dementia  William Mills likely has progressive dementia.SABRA William Mills is dependent on ADLs and has had multiple falls recently. Discussed goals of care with Nanette, daughter, at William bedside. She confirmed William Mills is DNR    Dysphagia SLP eval done, William Mills was started on dysphagia 1 diet  Hypertension Continued amlodipine , irbesartan   Discontinued HCTZ due to hypokalemia Use hydralazine . Monitor BP and titrate medication accordingly 8/12 William Mills had an episode of V. tach last night, continue to monitor on telemetry 8/13 started Imdur  30 mg p.o. daily  Comorbidities include hyperlipidemia, type II DM (recent hemoglobin A1c 6)  Hypophosphatemia, Phos repleted. Hypokalemia, potassium repleted. Monitor electrolytes and replete as needed.  Vitamin B12 deficiency: Started vitamin B12 1000 mcg IM injection daily during hospital stay, followed by oral supplement.  Follow-up PCP to repeat vitamin B12 level after 3 to 6 months.  Body mass index is 22.24 kg/m.  Diet Order             DIET - DYS 1 Room service appropriate? No; Fluid consistency: Thin  Diet effective now                   Consultants: None, but case was discussed with on-call neurology by previous attending.  Procedures: None    Medications:    amLODipine   10 mg Oral QPM   aspirin  EC  81 mg Oral Daily   atorvastatin   20 mg Oral QPM   brimonidine   1 drop Right Eye BID   And   timolol   1 drop Right Eye BID    cyanocobalamin   1,000 mcg Intramuscular Q1200   Followed by   NOREEN ON 10/07/2023] vitamin B-12  1,000 mcg Oral Daily   enoxaparin  (LOVENOX ) injection  40 mg Subcutaneous Q24H   irbesartan   300 mg Oral Daily   And   hydrochlorothiazide   12.5 mg Oral Daily   insulin  aspart  0-5 Units Subcutaneous QHS   insulin  aspart  0-9 Units Subcutaneous TID WC   isosorbide  mononitrate  30 mg Oral Daily   levETIRAcetam   1,000 mg Oral BID   potassium chloride   40 mEq Oral Q4H   thiamine   100 mg Oral Daily   Continuous Infusions:     Anti-infectives (From admission, onward)    None        Family Communication/Anticipated D/C date and plan/Code Status   DVT prophylaxis: enoxaparin  (LOVENOX ) injection 40 mg Start: 09/29/23 1000     Code Status: Limited: Do not attempt resuscitation (DNR) -DNR-LIMITED -Do Not Intubate/DNI   Family Communication: 8/12 Plan discussed with his daughter at William bedside 8/13 no one available at bedside today  Disposition Plan: Plan to discharge to long-term care facility   Status is: Observation William Mills will require care spanning > 2 midnights and should be moved to  inpatient because: Altered mental status  8/13 hypokalemia, potassium is 3.0 very low today, we will replete and plan for discharge tomorrow a.m.     Subjective:   No significant events overnight, William Mills was resting comfortably, remained pleasantly confused, AO x 1.  Did not have any complaints    Objective:    Vitals:   09/30/23 2056 10/01/23 0029 10/01/23 0512 10/01/23 0740  BP: (!) 177/87 (!) 141/75 (!) 165/68 (!) 177/80  Pulse: 80 81 80 81  Resp: 14 15 16 17   Temp: 98.8 F (37.1 C) 98.1 F (36.7 C) 97.9 F (36.6 C) 97.6 F (36.4 C)  TempSrc:      SpO2: 99% 100% 100% 97%  Weight:      Height:       No data found.   Intake/Output Summary (Last 24 hours) at 10/01/2023 1507 Last data filed at 10/01/2023 0900 Gross per 24 hour  Intake 360 ml  Output 1750 ml  Net  -1390 ml   Filed Weights   09/30/23 1127  Weight: 78.6 kg    Exam:  GEN: NAD SKIN: Warm and dry EYES: No pallor or icterus ENT: MMM CV: RRR PULM: CTA B ABD: soft, ND, NT, +BS CNS: Bilateral lower extremity rigidity, William Mills is able to move bilateral lower legs and upper extremities as well.  No new focal deficit noticed. Extremities: No edema   Data Reviewed:   I have personally reviewed following labs and imaging studies:  Labs: Labs show William following:   Basic Metabolic Panel: Recent Labs  Lab 09/28/23 1954 09/29/23 0614 09/30/23 0714 09/30/23 0905 10/01/23 1047  NA 140 142 140  --  140  K 3.6 3.5 3.3*  --  3.0*  CL 104 107 103  --  103  CO2 28 29 27   --  28  GLUCOSE 97 89 97  --  147*  BUN 17 14 11   --  12  CREATININE 0.76 0.70 0.64  --  0.75  CALCIUM  9.1 9.0 9.0  --  8.9  MG  --   --  2.1  --  1.9  PHOS  --   --   --  2.2* 2.8   GFR Estimated Creatinine Clearance: 72.3 mL/min (by C-G formula based on SCr of 0.75 mg/dL). Liver Function Tests: Recent Labs  Lab 09/28/23 1954  AST 16  ALT 10  ALKPHOS 54  BILITOT 1.3*  PROT 6.2*  ALBUMIN 3.4*   No results for input(s): LIPASE, AMYLASE in William last 168 hours. No results for input(s): AMMONIA in William last 168 hours. Coagulation profile No results for input(s): INR, PROTIME in William last 168 hours.  CBC: Recent Labs  Lab 09/28/23 1954 09/29/23 0614 09/30/23 0714 10/01/23 1047  WBC 4.3 4.2 5.0 5.0  HGB 12.6* 12.0* 12.8* 12.7*  HCT 37.3* 36.3* 39.2 36.9*  MCV 86.5 86.6 87.1 84.8  PLT 206 223 235 212   Cardiac Enzymes: Recent Labs  Lab 09/30/23 0905  CKTOTAL 200   BNP (last 3 results) No results for input(s): PROBNP in William last 8760 hours. CBG: Recent Labs  Lab 09/30/23 1159 09/30/23 1641 09/30/23 2056 10/01/23 0741 10/01/23 1200  GLUCAP 113* 132* 127* 98 141*   D-Dimer: No results for input(s): DDIMER in William last 72 hours. Hgb A1c: Recent Labs    09/28/23 1954   HGBA1C 5.5   Lipid Profile: Recent Labs    09/29/23 0614  CHOL 170  HDL 74  LDLCALC 90  TRIG 30  CHOLHDL 2.3   Thyroid  function studies: Recent Labs    09/30/23 0905  TSH 1.757   Anemia work up: Recent Labs    09/30/23 0905  VITAMINB12 174*   Sepsis Labs: Recent Labs  Lab 09/28/23 1954 09/29/23 0614 09/30/23 0714 10/01/23 1047  WBC 4.3 4.2 5.0 5.0    Microbiology No results found for this or any previous visit (from William past 240 hours).  Procedures and diagnostic studies:  US  Venous Img Lower Bilateral (DVT) Result Date: 09/30/2023 CLINICAL DATA:  Bilateral lower extremity edema. EXAM: BILATERAL LOWER EXTREMITY VENOUS DOPPLER ULTRASOUND TECHNIQUE: Gray-scale sonography with graded compression, as well as color Doppler and duplex ultrasound were performed to evaluate William lower extremity deep venous systems from William level of William common femoral vein and including William common femoral, femoral, profunda femoral, popliteal and calf veins including William posterior tibial, peroneal and gastrocnemius veins when visible. William superficial great saphenous vein was also interrogated. Spectral Doppler was utilized to evaluate flow at rest and with distal augmentation maneuvers in William common femoral, femoral and popliteal veins. COMPARISON:  None Available. FINDINGS: RIGHT LOWER EXTREMITY Common Femoral Vein: No evidence of thrombus. Normal compressibility, respiratory phasicity and response to augmentation. Saphenofemoral Junction: No evidence of thrombus. Normal compressibility and flow on color Doppler imaging. Profunda Femoral Vein: No evidence of thrombus. Normal compressibility and flow on color Doppler imaging. Femoral Vein: No evidence of thrombus. Normal compressibility, respiratory phasicity and response to augmentation. Popliteal Vein: No evidence of thrombus. Normal compressibility, respiratory phasicity and response to augmentation. Calf Veins: No evidence of thrombus. Normal  compressibility and flow on color Doppler imaging. Superficial Great Saphenous Vein: No evidence of thrombus. Normal compressibility. Venous Reflux:  None. Other Findings: No evidence of superficial thrombophlebitis or abnormal fluid collection. LEFT LOWER EXTREMITY Common Femoral Vein: No evidence of thrombus. Normal compressibility, respiratory phasicity and response to augmentation. Saphenofemoral Junction: No evidence of thrombus. Normal compressibility and flow on color Doppler imaging. Profunda Femoral Vein: No evidence of thrombus. Normal compressibility and flow on color Doppler imaging. Femoral Vein: No evidence of thrombus. Normal compressibility, respiratory phasicity and response to augmentation. Popliteal Vein: No evidence of thrombus. Normal compressibility, respiratory phasicity and response to augmentation. Calf Veins: No evidence of thrombus. Normal compressibility and flow on color Doppler imaging. Superficial Great Saphenous Vein: No evidence of thrombus. Normal compressibility. Venous Reflux:  None. Other Findings: No evidence of superficial thrombophlebitis or abnormal fluid collection. IMPRESSION: No evidence of deep venous thrombosis in either lower extremity. Electronically Signed   By: Marcey Moan M.D.   On: 09/30/2023 08:21     Total time spent: 35 minutes   LOS: 0 days   Drayke Grabel Von  [facility]   Pager on [email or web address]. If 7PM-7AM, please contact night-coverage at [email or web address]     10/01/2023, 3:07 PM

## 2023-10-02 DIAGNOSIS — R531 Weakness: Secondary | ICD-10-CM | POA: Diagnosis not present

## 2023-10-02 DIAGNOSIS — R569 Unspecified convulsions: Secondary | ICD-10-CM | POA: Diagnosis not present

## 2023-10-02 LAB — CBC
HCT: 35.7 % — ABNORMAL LOW (ref 39.0–52.0)
Hemoglobin: 11.9 g/dL — ABNORMAL LOW (ref 13.0–17.0)
MCH: 29 pg (ref 26.0–34.0)
MCHC: 33.3 g/dL (ref 30.0–36.0)
MCV: 87.1 fL (ref 80.0–100.0)
Platelets: 207 K/uL (ref 150–400)
RBC: 4.1 MIL/uL — ABNORMAL LOW (ref 4.22–5.81)
RDW: 14.1 % (ref 11.5–15.5)
WBC: 5.6 K/uL (ref 4.0–10.5)
nRBC: 0 % (ref 0.0–0.2)

## 2023-10-02 LAB — BASIC METABOLIC PANEL WITH GFR
Anion gap: 6 (ref 5–15)
BUN: 18 mg/dL (ref 8–23)
CO2: 30 mmol/L (ref 22–32)
Calcium: 8.9 mg/dL (ref 8.9–10.3)
Chloride: 104 mmol/L (ref 98–111)
Creatinine, Ser: 0.85 mg/dL (ref 0.61–1.24)
GFR, Estimated: >60 mL/min (ref >60)
Glucose, Bld: 98 mg/dL (ref 70–99)
Potassium: 4 mmol/L (ref 3.5–5.1)
Sodium: 140 mmol/L (ref 135–145)

## 2023-10-02 LAB — GLUCOSE, CAPILLARY
Glucose-Capillary: 108 mg/dL — ABNORMAL HIGH (ref 70–99)
Glucose-Capillary: 136 mg/dL — ABNORMAL HIGH (ref 70–99)
Glucose-Capillary: 118 mg/dL — ABNORMAL HIGH (ref 70–99)

## 2023-10-02 LAB — PHOSPHORUS: Phosphorus: 2.9 mg/dL (ref 2.5–4.6)

## 2023-10-02 LAB — MAGNESIUM: Magnesium: 2.1 mg/dL (ref 1.7–2.4)

## 2023-10-02 MED ORDER — HYDRALAZINE HCL 50 MG PO TABS: 50.0000 mg | ORAL_TABLET | Freq: Four times a day (QID) | ORAL | Status: AC | PRN

## 2023-10-02 MED ORDER — TAMSULOSIN HCL 0.4 MG PO CAPS: 0.4000 mg | ORAL_CAPSULE | Freq: Every day | ORAL | Status: DC

## 2023-10-02 MED ORDER — VITAMIN B-1 100 MG PO TABS: 100.0000 mg | ORAL_TABLET | Freq: Every day | ORAL | Status: AC

## 2023-10-02 MED ORDER — IRBESARTAN 300 MG PO TABS: 300.0000 mg | ORAL_TABLET | Freq: Every day | ORAL | Status: AC

## 2023-10-02 MED ORDER — SENNOSIDES-DOCUSATE SODIUM 8.6-50 MG PO TABS: 1.0000 | ORAL_TABLET | Freq: Every evening | ORAL | Status: AC | PRN

## 2023-10-02 MED ORDER — TAMSULOSIN HCL 0.4 MG PO CAPS: 0.4000 mg | ORAL_CAPSULE | Freq: Every day | ORAL | Status: AC

## 2023-10-02 MED ORDER — QUETIAPINE FUMARATE 25 MG PO TABS: 25.0000 mg | ORAL_TABLET | Freq: Every evening | ORAL | Status: AC

## 2023-10-02 MED ORDER — ISOSORBIDE MONONITRATE ER 30 MG PO TB24: 30.0000 mg | ORAL_TABLET | Freq: Every day | ORAL | Status: AC

## 2023-10-02 MED ORDER — TAMSULOSIN HCL 0.4 MG PO CAPS
0.4000 mg | ORAL_CAPSULE | Freq: Once | ORAL | Status: AC
  Administered 2023-10-02: 0.4 mg via ORAL
  Filled 2023-10-02: qty 1

## 2023-10-02 MED ORDER — CYANOCOBALAMIN 1000 MCG PO TABS: 1000.0000 ug | ORAL_TABLET | Freq: Every day | ORAL | Status: AC

## 2023-10-02 NOTE — Progress Notes (Signed)
 Transport arrived to get pt to facility.

## 2023-10-02 NOTE — TOC Transition Note (Signed)
 Transition of Care [facility]) - Discharge Note   Patient Details  Name: William Mills MRN: [id number] Date of Birth: Aug 15, 1934  [facility] CM/SW Contact:  Dalia GORMAN Fuse, RN Phone Number: 10/02/2023, 4:40 PM   Clinical Narrative:     Patient is medically ready to discharge to [facility]. TOC spoke with the facility anf they can accept the patietn today. TOC faxed FL2 and discharge summary to [employer]. TOC setup transportation via [facility] and made the nurse aware.     Barriers to Discharge: Continued Medical Work up   Patient Goals and CMS Choice            Discharge Placement                       Discharge Plan and Services Additional resources added to the After Visit Summary for                                       Social Drivers of Health (SDOH) Interventions SDOH Screenings   Food Insecurity: No Food Insecurity (09/29/2023)  Housing: Low Risk  (09/29/2023)  Transportation Needs: No Transportation Needs (09/29/2023)  Utilities: Not At Risk (09/29/2023)  Social Connections: Patient Unable To Answer (09/29/2023)  Tobacco Use: Medium Risk (09/28/2023)     Readmission Risk Interventions    10/31/2022    2:58 PM  Readmission Risk Prevention Plan  Post Dischage Appt Complete  Medication Screening Complete  Transportation Screening Complete

## 2023-10-02 NOTE — Progress Notes (Signed)
 Per Dr Hubert Madden, dc NIH/stroke orders

## 2023-10-02 NOTE — Plan of Care (Signed)

## 2023-10-02 NOTE — Discharge Summary (Signed)
 [facility] Discharge Summary   Patient: William Mills FMW:[id number]  PCP: Merilee Setter, NP  Date of admission: 09/28/2023   Date of discharge:  10/02/2023     Discharge Diagnoses:  Principal Problem:   Right sided weakness Active Problems:   hx of stroke   Multiple falls   Seizure (HCC)   Essential hypertension   Chronic diastolic CHF (congestive heart failure) (HCC)   Diabetes mellitus with complication (HCC)   Hyperlipidemia with target LDL less than 130   Dementia arising in the senium and presenium (HCC)   Admitted From: SNF Disposition:  SNF   Recommendations for Outpatient Follow-up:  Follow with PCP, patient should be seen by an MD in 1 to 2 days, monitor BP and titrate medication accordingly. Follow with hosp[facility] care at the Center facility. Follow up LABS/TEST:     Diet recommendation: Dysphagia type 1 thin Liquid  Activity: The patient is advised to gradually reintroduce usual activities, as tolerated  Discharge Condition: stable  Code Status: DNR   History of present illness: As per the H and P dictated on admission.   Hospital Course:  William Mills is a 89 y.o. male  with medical history significant of stroke, s/p of brain aneurysm repair, s/p VP shunt, left eye blindness, seizure,dCHF, dementia, who presents with multiple falls, right-sided weakness, confusion.   Per her daughter at the bedside, at her normal baseline, patient recognize her, sometimes knows the place, usually not know the time.  In the past 2 weeks, patient has been more confused.  Today patient is not oriented x 3.  He normally can walk with assistance, but now seems to have right-sided weakness, cannot walk any more.  Sometimes patient is leaning to the left side. He has had multiple falls, no significant injury.  Daughter reported recent fall about 5 days prior to admission.  He recently relocated from [place]  back to [place]  in September 2025 and he has been  living at the memory care unit since November 2025.   Assessment/Plan:   # Reported right sided weakness, history of stroke:  No acute stroke on CT head.  VP shunt is stable from CT head.  Cannot have MRI brain because of VP shunt. Case discussed with neurologist on-call, Dr. Matthews.  No further evaluation required at this time. Continue aspirin  and Lipitor. 2D echo LVEF 6065%, grade 1 diastolic dysfunction. Venous duplex negative for DVT He can follow-up with outpatient neurologist. PT and OT evaluation done, back to SNF Overall poor prognosis secondary to worsening of dementia.  Goals of care discussed with patient's daughter, palliative care was consulted.  Patient's daughter agreed for hospice care at the memory care unit but continue current treatment during hospital stay. TOC was informed to provide choices for hospice.     # Multiple falls Most likely due to worsening of dementia and ambulatory dysfunction.  B12 deficient.  Continue fall precaution and and continue physical therapy.   # Seizure disorder -Seizure precaution - As needed Ativan  for seizure - Continue home Keppra  1000 mg twice daily     # Chronic diastolic CHF (congestive heart failure) (HCC): 2D echo 10/30/2022 showed EF of 60 to 65% with grade 1 diastolic dysfunction. No evidence of acute exacerbation at this time. No need of diuretics.  Discontinued HCTZ.   # Hypertension Continued amlodipine , irbesartan   Discontinued HCTZ due to hypokalemia Use hydralazine  prn Monitor BP and titrate medication accordingly 8/12 patient had an episode of V. tach last night, continue to  monitor on telemetry 8/13 started Imdur  30 mg p.o. daily Monitor BP and titrate medications accordingly   # Peripheral edema, resolved BNP normal.  No clinical signs of CHF exacerbation Venous duplex negative for DVT     # Worsening altered mental status, possible acute metabolic encephalopathy vs progressive dementia  Patient likely has  progressive dementia.SABRA He is dependent on ADLs and has had multiple falls recently. Discussed goals of care with Nanette, daughter, at the bedside. She confirmed the patient is DNR.  Patient will benefit from hospice services at the SNF facility.     # Dysphagia SLP eval done, patient was started on dysphagia 1 diet     # Comorbidities include hyperlipidemia, type II DM (recent hemoglobin A1c 6)   # Hypophosphatemia, Phos repleted.  Resolved # Hypokalemia, potassium repleted.  Resolved   # Vitamin B12 deficiency: Started vitamin B12 1000 mcg IM injection daily during hospital stay, followed by oral supplement.  Follow-up PCP to repeat vitamin B12 level after 3 to 6 months.    Body mass index is 22.24 kg/m.  Nutrition Interventions:  Patient was seen by physical therapy, who recommended Therapy, SNF placement, which was arranged. On the day of the discharge the patient's vitals were stable, and no other acute medical condition were reported by patient. the patient was felt safe to be discharge at SNF with Therapy.  Consultants: None Procedures: None  Discharge Exam: General: Appear in no distress, no Rash; Oral Mucosa Clear, and dry Cardiovascular: S1 and S2 Present, no Murmur, Respiratory: normal respiratory effort, Bilateral Air entry present and no Crackles, no wheezes Abdomen: Bowel Sound present, Soft and no tenderness, no hernia Extremities: no Pedal edema, no calf tenderness Neurology: Severe dementia, AO x 1, intermittently following commands, bilateral lower extremity rigidity due to worsening of dementia.  Remains at risk for falls. affect flat and demented  Filed Weights   09/30/23 1127  Weight: 78.6 kg   Vitals:   10/02/23 0400 10/02/23 0800  BP: (!) 145/61 (!) 156/89  Pulse: 74 83  Resp: 18 18  Temp: 98.6 F (37 C) 98 F (36.7 C)  SpO2: 100% 99%    DISCHARGE MEDICATION: Allergies as of 10/02/2023       Reactions   Other Other (See Comments)   Can have  NO MRI(s) because of a metal plate in his head   Nifedipine  Other (See Comments)   Edema   Carbamazepine Other (See Comments)   Reaction unknown   Donepezil  Other (See Comments)   Was SUSPENDED by the patient's MD due to the fact it was [facility] HIS HEART RATE TOO LOW   Penicillins Other (See Comments)   Reaction unknown   Phenytoin Other (See Comments)   Reaction unknown        Medication List     STOP taking these medications    hydrochlorothiazide  12.5 MG tablet Commonly known as: HYDRODIURIL    irbesartan -hydrochlorothiazide  150-12.5 MG tablet Commonly known as: AVALIDE   irbesartan -hydrochlorothiazide  300-12.5 MG tablet Commonly known as: AVALIDE       TAKE these medications    amLODipine  10 MG tablet Commonly known as: NORVASC  Take 10 mg by mouth every evening.   atorvastatin  20 MG tablet Commonly known as: LIPITOR take 1 tablet by mouth once daily   Bayer Aspirin  EC Low Dose 81 MG tablet Generic drug: aspirin  EC Take 81 mg by mouth in the morning. Swallow whole.   bimatoprost  0.01 % Soln Commonly known as: Lumigan  INSTILL ONE DROP  INTO LEFT EYE EVERY DAY AT BEDTIME What changed:  how much to take how to take this when to take this   brimonidine -timolol  0.2-0.5 % ophthalmic solution Commonly known as: Combigan  Place 1 drop into the right eye 2 (two) times daily.   cyanocobalamin  1000 MCG tablet Take 1 tablet (1,000 mcg total) by mouth daily. Start taking on: October 07, 2023   donepezil  5 MG tablet Commonly known as: ARICEPT  take 1 tablet by mouth once daily   hydrALAZINE  50 MG tablet Commonly known as: APRESOLINE  Take 1 tablet (50 mg total) by mouth every 6 (six) hours as needed (SBP >150).   irbesartan  300 MG tablet Commonly known as: AVAPRO  Take 1 tablet (300 mg total) by mouth daily. Start taking on: October 03, 2023   isosorbide  mononitrate 30 MG 24 hr tablet Commonly known as: IMDUR  Take 1 tablet (30 mg total) by mouth  daily. Start taking on: October 03, 2023   levETIRAcetam  500 MG 24 hr tablet Commonly known as: KEPPRA  XR Take 2 tablets (1,000 mg total) by mouth 2 (two) times daily.   metFORMIN 500 MG tablet Commonly known as: GLUCOPHAGE Take 500 mg by mouth daily with breakfast.   QUEtiapine  25 MG tablet Commonly known as: SEROquel  Take 1 tablet (25 mg total) by mouth every evening. For sundowning, if patient is sleepy and drowsy then do not need to give it.   senna-docusate 8.6-50 MG tablet Commonly known as: Senokot-S Take 1 tablet by mouth at bedtime as needed for mild constipation.   tamsulosin  0.4 MG Caps capsule Commonly known as: FLOMAX  Take 1 capsule (0.4 mg total) by mouth at bedtime.   thiamine  100 MG tablet Commonly known as: Vitamin B-1 Take 1 tablet (100 mg total) by mouth daily. Start taking on: October 03, 2023   Vitamin D (Ergocalciferol) 1.25 MG (50000 UNIT) Caps capsule Commonly known as: DRISDOL Take 50,000 Units by mouth once a week.       Allergies  Allergen Reactions   Other Other (See Comments)    Can have NO MRI(s) because of a metal plate in his head   Nifedipine  Other (See Comments)    Edema    Carbamazepine Other (See Comments)    Reaction unknown   Donepezil  Other (See Comments)    Was SUSPENDED by the patient's MD due to the fact it was [facility] HIS HEART RATE TOO LOW   Penicillins Other (See Comments)    Reaction unknown   Phenytoin Other (See Comments)    Reaction unknown   Discharge Instructions     Call MD for:  difficulty breathing, headache or visual disturbances   Complete by: As directed    Call MD for:  extreme fatigue   Complete by: As directed    Call MD for:  persistant dizziness or light-headedness   Complete by: As directed    Call MD for:  severe uncontrolled pain   Complete by: As directed    Call MD for:  temperature >100.4   Complete by: As directed    Diet - low sodium heart healthy   Complete by: As directed     Discharge instructions   Complete by: As directed    Follow with PCP, patient should be seen by an MD in 1 to 2 days, monitor BP and titrate medication accordingly. Follow with hosp[facility] care at the Center facility.   Increase activity slowly   Complete by: As directed        The results  of significant diagnostics from this hospitalization (including imaging, microbiology, ancillary and laboratory) are listed below for reference.    Significant Diagnostic Studies: US  Venous Img Lower Bilateral (DVT) Result Date: 09/30/2023 CLINICAL DATA:  Bilateral lower extremity edema. EXAM: BILATERAL LOWER EXTREMITY VENOUS DOPPLER ULTRASOUND TECHNIQUE: Gray-scale sonography with graded compression, as well as color Doppler and duplex ultrasound were performed to evaluate the lower extremity deep venous systems from the level of the common femoral vein and including the common femoral, femoral, profunda femoral, popliteal and calf veins including the posterior tibial, peroneal and gastrocnemius veins when visible. The superficial great saphenous vein was also interrogated. Spectral Doppler was utilized to evaluate flow at rest and with distal augmentation maneuvers in the common femoral, femoral and popliteal veins. COMPARISON:  None Available. FINDINGS: RIGHT LOWER EXTREMITY Common Femoral Vein: No evidence of thrombus. Normal compressibility, respiratory phasicity and response to augmentation. Saphenofemoral Junction: No evidence of thrombus. Normal compressibility and flow on color Doppler imaging. Profunda Femoral Vein: No evidence of thrombus. Normal compressibility and flow on color Doppler imaging. Femoral Vein: No evidence of thrombus. Normal compressibility, respiratory phasicity and response to augmentation. Popliteal Vein: No evidence of thrombus. Normal compressibility, respiratory phasicity and response to augmentation. Calf Veins: No evidence of thrombus. Normal compressibility and flow on color Doppler  imaging. Superficial Great Saphenous Vein: No evidence of thrombus. Normal compressibility. Venous Reflux:  None. Other Findings: No evidence of superficial thrombophlebitis or abnormal fluid collection. LEFT LOWER EXTREMITY Common Femoral Vein: No evidence of thrombus. Normal compressibility, respiratory phasicity and response to augmentation. Saphenofemoral Junction: No evidence of thrombus. Normal compressibility and flow on color Doppler imaging. Profunda Femoral Vein: No evidence of thrombus. Normal compressibility and flow on color Doppler imaging. Femoral Vein: No evidence of thrombus. Normal compressibility, respiratory phasicity and response to augmentation. Popliteal Vein: No evidence of thrombus. Normal compressibility, respiratory phasicity and response to augmentation. Calf Veins: No evidence of thrombus. Normal compressibility and flow on color Doppler imaging. Superficial Great Saphenous Vein: No evidence of thrombus. Normal compressibility. Venous Reflux:  None. Other Findings: No evidence of superficial thrombophlebitis or abnormal fluid collection. IMPRESSION: No evidence of deep venous thrombosis in either lower extremity. Electronically Signed   By: Marcey Moan M.D.   On: 09/30/2023 08:21   ECHOCARDIOGRAM COMPLETE Result Date: 09/29/2023    ECHOCARDIOGRAM REPORT   Patient Name:   ABYAN CADMAN Date of Exam: 09/29/2023 Medical Rec #:  [id number]        Height:       74.0 in Accession #:    [id number]       Weight:       176.8 lb Date of Birth:  May 07, 1934         BSA:          2.063 m Patient Age:    89 years         BP:           157/71 mmHg Patient Gender: M                HR:           67 bpm. Exam Location:  [facility] Procedure: 2D Echo, Cardiac Doppler and Color Doppler (Both Spectral and Color            Flow Doppler were utilized during procedure). Indications:     Stroke I63.9  History:         Patient has prior history of Echocardiogram examinations, most  recent  10/30/2022. CHF, Stroke, Signs/Symptoms:Syncope; Risk                  Factors:Hypertension, Diabetes and Dyslipidemia.  Sonographer:     Thea Norlander RCS Referring Phys:  [id number] XILIN NIU Diagnosing Phys: Marsa Dooms MD  Sonographer Comments: Image acquisition challenging due to patient body habitus. IMPRESSIONS  1. Left ventricular ejection fraction, by estimation, is 60 to 65%. The left ventricle has normal function. The left ventricle has no regional wall motion abnormalities. Left ventricular diastolic parameters are consistent with Grade I diastolic dysfunction (impaired relaxation).  2. Right ventricular systolic function is normal. The right ventricular size is normal.  3. The mitral valve is normal in structure. Mild mitral valve regurgitation. No evidence of mitral stenosis.  4. The aortic valve is normal in structure. Aortic valve regurgitation is not visualized. No aortic stenosis is present.  5. The inferior vena cava is normal in size with greater than 50% respiratory variability, suggesting right atrial pressure of 3 mmHg. FINDINGS  Left Ventricle: Left ventricular ejection fraction, by estimation, is 60 to 65%. The left ventricle has normal function. The left ventricle has no regional wall motion abnormalities. Strain was performed and the global longitudinal strain is indeterminate. The left ventricular internal cavity size was normal in size. There is no left ventricular hypertrophy. Left ventricular diastolic parameters are consistent with Grade I diastolic dysfunction (impaired relaxation). Right Ventricle: The right ventricular size is normal. No increase in right ventricular wall thickness. Right ventricular systolic function is normal. Left Atrium: Left atrial size was normal in size. Right Atrium: Right atrial size was normal in size. Pericardium: There is no evidence of pericardial effusion. Mitral Valve: The mitral valve is normal in structure. Mild mitral valve regurgitation. No  evidence of mitral valve stenosis. Tricuspid Valve: The tricuspid valve is normal in structure. Tricuspid valve regurgitation is mild . No evidence of tricuspid stenosis. Aortic Valve: The aortic valve is normal in structure. Aortic valve regurgitation is not visualized. No aortic stenosis is present. Aortic valve peak gradient measures 4.4 mmHg. Pulmonic Valve: The pulmonic valve was normal in structure. Pulmonic valve regurgitation is not visualized. No evidence of pulmonic stenosis. Aorta: The aortic root is normal in size and structure. Venous: The inferior vena cava is normal in size with greater than 50% respiratory variability, suggesting right atrial pressure of 3 mmHg. IAS/Shunts: No atrial level shunt detected by color flow Doppler. Additional Comments: 3D was performed not requiring image post processing on an independent workstation and was indeterminate.  LEFT VENTRICLE PLAX 2D LVIDd:         4.50 cm   Diastology LVIDs:         3.00 cm   LV e' medial:    8.70 cm/s LV PW:         1.00 cm   LV E/e' medial:  8.9 LV IVS:        0.80 cm   LV e' lateral:   10.60 cm/s LVOT diam:     2.30 cm   LV E/e' lateral: 7.3 LV SV:         85 LV SV Index:   41 LVOT Area:     4.15 cm  RIGHT VENTRICLE             IVC RV S prime:     11.50 cm/s  IVC diam: 2.35 cm TAPSE (M-mode): 2.2 cm LEFT ATRIUM  Index        RIGHT ATRIUM           Index LA diam:        3.90 cm 1.89 cm/m   RA Area:     17.30 cm LA Vol (A2C):   53.9 ml 26.13 ml/m  RA Volume:   42.30 ml  20.51 ml/m LA Vol (A4C):   45.1 ml 21.87 ml/m LA Biplane Vol: 52.0 ml 25.21 ml/m  AORTIC VALVE AV Area (Vmax): 4.31 cm AV Vmax:        105.00 cm/s AV Peak Grad:   4.4 mmHg LVOT Vmax:      109.00 cm/s LVOT Vmean:     67.600 cm/s LVOT VTI:       0.204 m  AORTA Ao Root diam: 3.90 cm Ao Asc diam:  3.70 cm MITRAL VALVE                TRICUSPID VALVE MV Area (PHT): 2.99 cm     TR Peak grad:   21.9 mmHg MV Decel Time: 254 msec     TR Vmax:        234.00 cm/s MR  Peak grad: 102.8 mmHg MR Vmax:      507.00 cm/s   SHUNTS MV E velocity: 77.80 cm/s   Systemic VTI:  0.20 m MV A velocity: 130.00 cm/s  Systemic Diam: 2.30 cm MV E/A ratio:  0.60 Marsa Dooms MD Electronically signed by Marsa Dooms MD Signature Date/Time: 09/29/2023/1:35:42 PM    Final    CT Angio Head Neck W WO CM Result Date: 09/28/2023 CLINICAL DATA:  Initial evaluation for history of aneurysm. EXAM: CT ANGIOGRAPHY HEAD AND NECK WITH AND WITHOUT CONTRAST TECHNIQUE: Multidetector CT imaging of the head and neck was performed using the standard protocol during bolus administration of intravenous contrast. Multiplanar CT image reconstructions and MIPs were obtained to evaluate the vascular anatomy. Carotid stenosis measurements (when applicable) are obtained utilizing NASCET criteria, using the distal internal carotid diameter as the denominator. RADIATION DOSE REDUCTION: This exam was performed according to the departmental dose-optimization program which includes automated exposure control, adjustment of the mA and/or kV according to patient size and/or use of iterative reconstruction technique. CONTRAST:  75mL OMNIPAQUE  IOHEXOL  350 MG/ML SOLN COMPARISON:  CT from 09/24/2023 FINDINGS: CT HEAD FINDINGS Brain: Postoperative changes from prior left pterional craniotomy for surgical clipping of aneurysm in the region of the left ICA terminus. Chronic encephalomalacia of involving the left parietal and temporal lobes. Underlying atrophy with chronic small vessel ischemic disease. Right parietal approach VP shunt catheter in place with tip terminating in the right lateral ventricle. Stable ventricular size and morphology without hydrocephalus. Small bilateral subdural collections measuring up to 2 mm on the left and 3 mm on the right, likely related to shunting, and similar to prior. No midline shift. No acute large vessel territory infarct. No acute intracranial hemorrhage. No mass lesion. Vascular: No  abnormal hyperdense vessel. Calcified atherosclerosis present at the skull base. Skull: Scalp soft tissues demonstrate no acute finding. Prior left pterional craniotomy. Right-sided VP shunt catheter in place. Sinuses/Orbits: Globes orbital soft tissues within normal limits. Paranasal sinuses are largely clear. No mastoid effusion. Other: None. Review of the MIP images confirms the above findings CTA NECK FINDINGS Aortic arch: Visualized aortic arch within normal limits for caliber with standard 3 vessel morphology. Aortic atherosclerosis. No significant stenosis about the origin the great vessels. Right carotid system: Right common and internal carotid arteries are patent  without dissection. Atheromatous change about the right carotid bulb without hemodynamically significant greater than 50% stenosis. Left carotid system: Left common and internal carotid arteries are patent without dissection. Atheromatous change about the left carotid bulb without hemodynamically significant greater than 50% stenosis. Vertebral arteries: Both vertebral arteries arise from subclavian arteries. Right vertebral artery dominant and patent without significant stenosis or dissection. Left vertebral artery markedly hypoplastic and patent at its origin, but occludes at the proximal V2 segment. Scant attenuated distal reconstitution at the left V3 segment (series 6, image 162). Left vertebral artery is patent as it crosses into the cranial vault. Skeleton: No worrisome osseous lesions. Moderate spondylosis at C5-6 through C7-T1. Patient is edentulous. Other neck: No other acute finding. Upper chest: Mild emphysema.  No other acute finding. Review of the MIP images confirms the above findings CTA HEAD FINDINGS Anterior circulation: Atheromatous change seen about the carotid siphons bilaterally. Resultant moderate stenosis at the supraclinoid right ICA (series 6, image 96). Postoperative changes from prior aneurysm repair at the left ICA  terminus. 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 A1 segments patent bilaterally. Normal anterior communicating artery complex. Left ACA patent without stenosis. Focal severe distal right 8 3 stenosis (series 11, image 22). Right M1 segment patent without stenosis. Focus of calcified plaque noted within the distal left M1 segment without significant stenosis. No proximal MCA branch occlusion. Small vessel atheromatous irregularity noted throughout the MCA branches bilaterally. Posterior circulation: Dominant right V4 segment patent without significant stenosis. Right PICA patent at its origin. Left vertebral artery markedly hypoplastic and attenuated but grossly patent to the vertebrobasilar junction. Left PICA not seen. Basilar patent without stenosis. Superior cerebral arteries patent bilaterally. Predominant fetal type origin of the PCAs bilaterally. Both PCAs patent to their distal aspects without significant stenosis. Venous sinuses: Not well assessed due to arterial timing the contrast bolus. Anatomic variants: As above. Review of the MIP images confirms the above findings IMPRESSION: CT HEAD: 1. No acute intracranial abnormality. 2. Postoperative changes from prior left pterional craniotomy for surgical clipping of aneurysm in the region of the left ICA terminus. 3. Right parietal approach VP shunt catheter in place with tip terminating in the right lateral ventricle. Stable ventricular size and morphology without hydrocephalus. 4. Small bilateral subdural collections measuring up to 2 mm on the left and 3 mm on the right, likely related to shunting. No midline shift. 5. Underlying atrophy with chronic small vessel ischemic disease. CTA HEAD AND NECK: 1. Postoperative changes from prior aneurysm repair at the left ICA terminus. There is fusiform aneurysmal dilatation of the  underlying left ICA terminus/proximal left M1 segment, measuring up to 5 mm in transverse diameter. 2. Markedly hypoplastic left vertebral artery, which occludes at the proximal V2 segment. Scant attenuated distal reconstitution at the left V3 segment. Left vertebral artery is grossly patent distally to the vertebrobasilar junction. 3. Atheromatous change about the carotid siphons with resultant moderate stenosis at the supraclinoid right ICA. 4. Focal severe distal right A3 stenosis. Aortic Atherosclerosis (ICD10-I70.0) and Emphysema (ICD10-J43.9). Electronically Signed   By: Morene Hoard M.D.   On: 09/28/2023 23:34   DG Cervical Spine 1 View Result Date: 09/28/2023 CLINICAL DATA:  evaluate for shunt malfunction; [id number] AMS (altered mental status) [id number] EXAM: CHEST  1 VIEW; DG CERVICAL SPINE - 1 VIEW; ABDOMEN - 1 VIEW COMPARISON:  None Available. FINDINGS: The heart  and mediastinal contours are within normal limits. No focal consolidation. No pulmonary edema. No pleural effusion. No pneumothorax. Excreted previously administered intravenous contrast from bilateral collecting systems. Nonobstructive bowel gas pattern. No acute osseous abnormality. No kinking or discontinuity of the right ventriculoperitoneal shunt noted. Tip overlies the right mid abdomen. IMPRESSION: 1. Negative frontal view C-spine, chest, abdomen pelvis. 2. Right ventriculoperitoneal shunt with no discontinuity or kinking. Electronically Signed   By: Morgane  Naveau M.D.   On: 09/28/2023 21:51   DG Chest 1 View Result Date: 09/28/2023 CLINICAL DATA:  evaluate for shunt malfunction; [id number] AMS (altered mental status) [id number] EXAM: CHEST  1 VIEW; DG CERVICAL SPINE - 1 VIEW; ABDOMEN - 1 VIEW COMPARISON:  None Available. FINDINGS: The heart and mediastinal contours are within normal limits. No focal consolidation. No pulmonary edema. No pleural effusion. No pneumothorax. Excreted previously administered intravenous contrast from  bilateral collecting systems. Nonobstructive bowel gas pattern. No acute osseous abnormality. No kinking or discontinuity of the right ventriculoperitoneal shunt noted. Tip overlies the right mid abdomen. IMPRESSION: 1. Negative frontal view C-spine, chest, abdomen pelvis. 2. Right ventriculoperitoneal shunt with no discontinuity or kinking. Electronically Signed   By: Morgane  Naveau M.D.   On: 09/28/2023 21:51   DG Abd 1 View Result Date: 09/28/2023 CLINICAL DATA:  evaluate for shunt malfunction; [id number] AMS (altered mental status) [id number] EXAM: CHEST  1 VIEW; DG CERVICAL SPINE - 1 VIEW; ABDOMEN - 1 VIEW COMPARISON:  None Available. FINDINGS: The heart and mediastinal contours are within normal limits. No focal consolidation. No pulmonary edema. No pleural effusion. No pneumothorax. Excreted previously administered intravenous contrast from bilateral collecting systems. Nonobstructive bowel gas pattern. No acute osseous abnormality. No kinking or discontinuity of the right ventriculoperitoneal shunt noted. Tip overlies the right mid abdomen. IMPRESSION: 1. Negative frontal view C-spine, chest, abdomen pelvis. 2. Right ventriculoperitoneal shunt with no discontinuity or kinking. Electronically Signed   By: Morgane  Naveau M.D.   On: 09/28/2023 21:51   DG Skull 1-3 Views Result Date: 09/28/2023 CLINICAL DATA:  Evaluate for shunt malfunction EXAM: SKULL - 1-3 VIEW COMPARISON:  Head CT 09/24/2023. FINDINGS: VP shunt catheter is visualized in the right parietal region extending into the left neck. The visualized portion of the catheter appears intact. Temporal craniotomy again noted. No acute fractures. IMPRESSION: VP shunt catheter is visualized in the right parietal region extending into the left neck. Catheter appears intact. Electronically Signed   By: Greig Pique M.D.   On: 09/28/2023 21:49   CT Cervical Spine Wo Contrast Result Date: 09/28/2023 CLINICAL DATA:  Neck trauma (Age >= 65y), dementia EXAM:  CT CERVICAL SPINE WITHOUT CONTRAST TECHNIQUE: Multidetector CT imaging of the cervical spine was performed without intravenous contrast. Multiplanar CT image reconstructions were also generated. RADIATION DOSE REDUCTION: This exam was performed according to the departmental dose-optimization program which includes automated exposure control, adjustment of the mA and/or kV according to patient size and/or use of iterative reconstruction technique. COMPARISON:  None Available. FINDINGS: Alignment: Normal. Skull base and vertebrae: Grade cervical alignment is normal. The atlantodental interval is not widened. No acute fracture of the cervical spine. Vertebral body height is preserved. Soft tissues and spinal canal: No prevertebral fluid or swelling. No visible canal hematoma. Disc levels: Disc space narrowing and endplate remodeling is seen within the cervical spine at C4-T1 in keeping with changes moderate degenerative disc disease. Prevertebral soft tissues are not thickened on sagittal reformats. Spinal is widely patent. Uncovertebral and facet arthrosis results in diffuse  moderate neuroforaminal narrowing Upper chest: Mild emphysema. Other: Ventriculoperitoneal shunt catheter tubing is seen within the right cervical soft tissues. IMPRESSION: 1. No acute fracture or listhesis of the cervical spine. 2. Degenerative disc and degenerative joint disease resulting in diffuse moderate neuroforaminal narrowing. 3. Mild emphysema. Emphysema (ICD10-J43.9). Electronically Signed   By: Dorethia Molt M.D.   On: 09/28/2023 21:48   CT Head Wo Contrast Result Date: 09/24/2023 EXAM: CT HEAD WITHOUT CONTRAST 09/24/2023 10:00:43 AM TECHNIQUE: CT of the head was performed without the administration of intravenous contrast. Automated exposure control, iterative reconstruction, and/or weight based adjustment of the mA/kV was utilized to reduce the radiation dose to as low as reasonably achievable. COMPARISON: CT of the head dated  01/01/2023. CLINICAL HISTORY: Mental status change, unknown cause. Arrives via EMS for fall, unwitnessed last night came [facility]; unknown LOC. FINDINGS: BRAIN AND VENTRICLES: No acute hemorrhage. Gray-white differentiation is preserved. No hydrocephalus. No extra-axial collection. No mass effect or midline shift. Chronic encephalomalacia changes again demonstrated within the left parietal and temporal lobes. There is moderate periventricular and deep cerebral white matter disease present. A right parietal approach ventriculostomy catheter is again demonstrated with its tip in the body of the right lateral ventricle. 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 ORBITS: Patient is status post right lens replacement. SINUSES: No acute abnormality. SOFT TISSUES AND SKULL: No acute soft tissue abnormality. No skull fracture. IMPRESSION: 1. No acute intracranial abnormality. 2. Chronic encephalomalacia changes in the left parietal and temporal lobes. 3. Moderate periventricular and deep cerebral white matter disease. 4. Right parietal approach ventriculostomy catheter in the body of the right lateral ventricle. 5. 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 Electronically signed by: evalene coho 09/24/2023 11:22 AM EDT RP Workstation: [id number]    Microbiology: No results found for this or any previous visit (from the past 240 hours).   Labs: CBC: Recent Labs  Lab 09/28/23 1954 09/29/23 0614 09/30/23 0714 10/01/23 1047 10/02/23 0409  WBC 4.3 4.2 5.0 5.0 5.6  HGB 12.6* 12.0* 12.8* 12.7* 11.9*  HCT 37.3* 36.3* 39.2 36.9* 35.7*  MCV 86.5 86.6 87.1 84.8 87.1  PLT 206 223 235 212 207   Basic Metabolic Panel: Recent Labs  Lab 09/28/23 1954 09/29/23 0614 09/30/23 0714 09/30/23 0905 10/01/23 1047 10/02/23 0409  NA 140 142 140  --  140 140  K 3.6 3.5 3.3*  --   3.0* 4.0  CL 104 107 103  --  103 104  CO2 28 29 27   --  28 30  GLUCOSE 97 89 97  --  147* 98  BUN 17 14 11   --  12 18  CREATININE 0.76 0.70 0.64  --  0.75 0.85  CALCIUM  9.1 9.0 9.0  --  8.9 8.9  MG  --   --  2.1  --  1.9 2.1  PHOS  --   --   --  2.2* 2.8 2.9   Liver Function Tests: Recent Labs  Lab 09/28/23 1954  AST 16  ALT 10  ALKPHOS 54  BILITOT 1.3*  PROT 6.2*  ALBUMIN 3.4*   No results for input(s): LIPASE, AMYLASE in the last 168 hours. No results for input(s): AMMONIA in the last 168 hours. Cardiac Enzymes: Recent Labs  Lab 09/30/23 0905  CKTOTAL 200   BNP (last 3 results) Recent Labs    09/29/23 [phone]  BNP 32.7   CBG: Recent Labs  Lab 10/01/23 1200 10/01/23 1639 10/01/23 2010 10/02/23 0801 10/02/23 1148  GLUCAP 141* 106* 168* 108* 136*    Time spent: 35 minutes  Signed:  Elvan Sor  [facility] 10/02/2023 2:27 PM

## 2023-12-02 ENCOUNTER — Other Ambulatory Visit: Payer: Self-pay

## 2023-12-02 ENCOUNTER — Encounter: Payer: Self-pay | Admitting: Emergency Medicine

## 2023-12-02 ENCOUNTER — Emergency Department

## 2023-12-02 DIAGNOSIS — Z043 Encounter for examination and observation following other accident: Secondary | ICD-10-CM | POA: Insufficient documentation

## 2023-12-02 DIAGNOSIS — I10 Essential (primary) hypertension: Secondary | ICD-10-CM | POA: Diagnosis not present

## 2023-12-02 DIAGNOSIS — E119 Type 2 diabetes mellitus without complications: Secondary | ICD-10-CM | POA: Insufficient documentation

## 2023-12-02 DIAGNOSIS — F0390 Unspecified dementia without behavioral disturbance: Secondary | ICD-10-CM | POA: Insufficient documentation

## 2023-12-02 DIAGNOSIS — W19XXXA Unspecified fall, initial encounter: Secondary | ICD-10-CM | POA: Insufficient documentation

## 2023-12-02 LAB — CBC
HCT: 37.2 % — ABNORMAL LOW (ref 39.0–52.0)
Hemoglobin: 12.3 g/dL — ABNORMAL LOW (ref 13.0–17.0)
MCH: 28.9 pg (ref 26.0–34.0)
MCHC: 33.1 g/dL (ref 30.0–36.0)
MCV: 87.5 fL (ref 80.0–100.0)
Platelets: 217 K/uL (ref 150–400)
RBC: 4.25 MIL/uL (ref 4.22–5.81)
RDW: 14.7 % (ref 11.5–15.5)
WBC: 4.9 K/uL (ref 4.0–10.5)
nRBC: 0 % (ref 0.0–0.2)

## 2023-12-02 NOTE — ED Triage Notes (Addendum)
 Pt arrives via EMS from [facility] for unwitnessed fall; staff found pt lying on floor and and noted hematoma to forehead and rt side scalp; EMS reports pt is nonverbal with dementia, at baseline per staff; pt uprite on stretcher, pt says not really when asked if in pain; no grimacing noted with palpation of extremities/head/neck/spine/chest/abd; no hematoma noted, skin intact

## 2023-12-03 ENCOUNTER — Emergency Department
Admission: EM | Admit: 2023-12-03 | Discharge: 2023-12-03 | Disposition: A | Discharge status: Skilled Nursing Facility | Attending: Emergency Medicine | Admitting: Emergency Medicine

## 2023-12-03 ENCOUNTER — Emergency Department

## 2023-12-03 DIAGNOSIS — W19XXXA Unspecified fall, initial encounter: Secondary | ICD-10-CM

## 2023-12-03 LAB — COMPREHENSIVE METABOLIC PANEL WITH GFR
ALT: 24 U/L (ref 0–44)
AST: 22 U/L (ref 15–41)
Albumin: 3.5 g/dL (ref 3.5–5.0)
Alkaline Phosphatase: 49 U/L (ref 38–126)
Anion gap: 13 (ref 5–15)
BUN: 28 mg/dL — ABNORMAL HIGH (ref 8–23)
CO2: 25 mmol/L (ref 22–32)
Calcium: 8.9 mg/dL (ref 8.9–10.3)
Chloride: 105 mmol/L (ref 98–111)
Creatinine, Ser: 0.99 mg/dL (ref 0.61–1.24)
GFR, Estimated: >60 mL/min (ref >60)
Glucose, Bld: 104 mg/dL — ABNORMAL HIGH (ref 70–99)
Potassium: 3.2 mmol/L — ABNORMAL LOW (ref 3.5–5.1)
Sodium: 143 mmol/L (ref 135–145)
Total Bilirubin: 0.8 mg/dL (ref 0.0–1.2)
Total Protein: 6.5 g/dL (ref 6.5–8.1)

## 2023-12-03 NOTE — Discharge Instructions (Signed)
 Fortunately the testing in the emergency department did not show any serious injuries from the fall.  Blood testing was unremarkable as well.  Thank you for choosing us  for your health care today!  Please see your primary doctor this week for a follow up appointment.   If you have any new, worsening, or unexpected symptoms call your doctor right away or come back to the emergency department for reevaluation.  It was my pleasure to care for you today.   Ginnie EDISON Cyrena, MD

## 2023-12-03 NOTE — ED Notes (Signed)
 Spoke with Delon from [facility]; unit enroute

## 2023-12-03 NOTE — ED Notes (Signed)
 This RN called and spoke with pt's POA/Daughter, Nanette, and updated her about the patient's visit and that he is planned to be transported back to [facility]. All questions answered.

## 2023-12-03 NOTE — ED Provider Notes (Signed)
 [facility] Provider Note    Event Date/Time   First MD Initiated Contact with Patient 12/03/23 [phone]     (approximate)   History   Fall   HPI  William Mills is a 89 y.o. male   Past medical history of dementia, hypertension, hyperlipidemia, diabetes, here with unwitnessed fall from [facility].    EMS reports he is nonverbal with dementia, and at baseline per staff.  He has no acute medical complaints and is resting comfortably.  History from patient is complicated by his baseline dementia.  External Medical Documents Reviewed: Previous hospital note      Physical Exam   Triage Vital Signs: ED Triage Vitals [12/02/23 2344]  Encounter Vitals Group     BP (!) 196/85     Girls Systolic BP Percentile      Girls Diastolic BP Percentile      Boys Systolic BP Percentile      Boys Diastolic BP Percentile      Pulse Rate 88     Resp 18     Temp 97.7 F (36.5 C)     Temp Source Oral     SpO2 97 %     Weight 159 lb 8 oz (72.3 kg)     Height 6' 2 (1.88 m)     Head Circumference      Peak Flow      Pain Score      Pain Loc      Pain Education      Exclude from Growth Chart     Most recent vital signs: Vitals:   12/03/23 0321 12/03/23 0503  BP: (!) 183/77 (!) 170/81  Pulse: 76 68  Resp: 16 18  Temp: 97.7 F (36.5 C) 97.7 F (36.5 C)  SpO2: 100% 100%    General: Awake, no distress.  CV:  Good peripheral perfusion.  Resp:  Normal effort.  Abd:  No distention.  Other:  Sleeping comfortably in stretcher no acute distress, hypertensive otherwise vital signs normal.  No obvious trauma on external examination, palpation of the head, neck, T-spine and L-spine, thorax and abdomen elicits no pain.  I amendable to passively range all extremities without eliciting pain reactions..   ED Results / Procedures / Treatments   Labs (all labs ordered are listed, but only abnormal results are displayed) Labs Reviewed  CBC - Abnormal; Notable  for the following components:      Result Value   Hemoglobin 12.3 (*)    HCT 37.2 (*)    All other components within normal limits  COMPREHENSIVE METABOLIC PANEL WITH GFR - Abnormal; Notable for the following components:   Potassium 3.2 (*)    Glucose, Bld 104 (*)    BUN 28 (*)    All other components within normal limits     I ordered and reviewed the above labs they are notable for cell counts electrolytes unremarkable  EKG  ED ECG REPORT I, Ginnie Shams, the attending physician, personally viewed and interpreted this ECG.   Date: 12/03/2023  EKG Time: 2333  Rate: 84  Rhythm: sinus  Axis: nl  Intervals:nl  ST&T Change: no stemi    RADIOLOGY I independently reviewed and interpreted CT of the head and see no obvious bleeding or midline shift I also reviewed radiologist's formal read.   PROCEDURES:  Critical Care performed: No  Procedures   MEDICATIONS ORDERED IN ED: Medications - No data to display   IMPRESSION / MDM / ASSESSMENT  AND PLAN / ED COURSE  I reviewed the triage vital signs and the nursing notes.                                Patient's presentation is most consistent with acute presentation with potential threat to life or bodily function.  Differential diagnosis includes, but is not limited to, blunt traumatic injury sustained from fall   The patient is on the cardiac monitor to evaluate for evidence of arrhythmia and/or significant heart rate changes.  MDM:    Unwitnessed fall in this patient with dementia with no complaints currently and looks well on clinical exam, traumatic imaging negative including CT head and neck, as well as chest x-ray pelvis.  Unknown mechanism of fall so basic medical screening with basic labs, EKG unremarkable.  Patient remained stable throughout the emergency department stay, appropriate for discharge.      FINAL CLINICAL IMPRESSION(S) / ED DIAGNOSES   Final diagnoses:  Fall, initial encounter     Rx /  DC Orders   ED Discharge Orders     None        Note:  This document was prepared using Dragon voice recognition software and may include unintentional dictation errors.    Cyrena Mylar, MD 12/03/23 [phone]

## 2023-12-08 ENCOUNTER — Other Ambulatory Visit: Payer: Self-pay

## 2023-12-08 ENCOUNTER — Emergency Department
Admission: EM | Admit: 2023-12-08 | Discharge: 2023-12-08 | Disposition: A | Discharge status: Home / Self Care | Attending: Emergency Medicine | Admitting: Emergency Medicine

## 2023-12-08 ENCOUNTER — Emergency Department

## 2023-12-08 DIAGNOSIS — S06359A Traumatic hemorrhage of left cerebrum with loss of consciousness of unspecified duration, initial encounter: Secondary | ICD-10-CM | POA: Diagnosis present

## 2023-12-08 DIAGNOSIS — F0390 Unspecified dementia without behavioral disturbance: Secondary | ICD-10-CM | POA: Diagnosis not present

## 2023-12-08 DIAGNOSIS — W1839XA Other fall on same level, initial encounter: Secondary | ICD-10-CM | POA: Diagnosis not present

## 2023-12-08 DIAGNOSIS — S0083XA Contusion of other part of head, initial encounter: Secondary | ICD-10-CM

## 2023-12-08 DIAGNOSIS — I5030 Unspecified diastolic (congestive) heart failure: Secondary | ICD-10-CM | POA: Diagnosis not present

## 2023-12-08 NOTE — ED Notes (Signed)
 Pt transported to CT ?

## 2023-12-08 NOTE — ED Notes (Signed)
 [facility] called for transport to [facility] , spoke with Ed

## 2023-12-08 NOTE — ED Notes (Signed)
 Family at bedside including daughter who is POA. EDP at bedside to discuss care plan

## 2023-12-08 NOTE — ED Notes (Signed)
 Report called to [facility] to [place]. Awaiting transport to take the patient back.

## 2023-12-08 NOTE — ED Notes (Signed)
 Pt back from CT. CT tech reporting pt tolerated scan well

## 2023-12-08 NOTE — ED Notes (Signed)
 [facility] arrival for transport. Packet given. Family at the bedside.

## 2023-12-08 NOTE — ED Triage Notes (Signed)
 Pt to ED via [facility] from [facility]. Daughter called EMS. Pt here for fall yesterday - unsure when and unwitnessed. Pt is not on blood thinner. Pt is under hospice and has been off all of his meds except for anxiety medication. Pt has hematoma to left eye/head and bandage in place.   209/89 97% RA  96 HR

## 2023-12-08 NOTE — ED Provider Notes (Signed)
 [facility] Provider Note    Event Date/Time   First MD Initiated Contact with Patient 12/08/23 1020     (approximate)   History   Fall (/)  EM caveat: Severe dementia  HPI  William Mills is a 89 y.o. male  89 y.o. male  with medical history significant of stroke, s/p of brain aneurysm repair, s/p VP shunt, left eye blindness, seizure,dCHF, dementia   He fell yesterday.  Was noted to have bruising and slight amount of bleeding or abrasion over the left eye left eye being swollen as well.  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  With regard to his hospice goals they are comfort only and he is not pursuing any medical treatment and has actually been taken off all of his medications with exception to lorazepam  which he uses for agitation  When the facility requires that the patient be evaluated after a fall per daughter.  Daughter reports she is interested in having a CT scan done to assess for injury but based on his goals of care they would not pursue any sort of changes in treatment other than focusing on comfort.  His current care is to be focused on comfort care only  Hospice saw and evaluated and bandaged his abrasion, they have also been following and bandaging an abrasion to the right foot  Majority of history as well as all medical decision making decisions discussed with Nanette (POA) [place]izlip his daughter at bedside.     On discharge in August noted at that time to have right sided weakness.  Cannot undergo MRI.  At that time he was felt to have a poor prognosis with worsening of his dementia.  Also history of multiple falls and seizure disorder  Daughter reports he has had breakfast, no specific comfort care concerns at this time.  Patient himself reports not being in any pain  Physical Exam   Triage  Vital Signs: ED Triage Vitals [12/08/23 1016]  Encounter Vitals Group     BP (!) 185/113     Girls Systolic BP Percentile      Girls Diastolic BP Percentile      Boys Systolic BP Percentile      Boys Diastolic BP Percentile      Pulse Rate 100     Resp 18     Temp 97.9 F (36.6 C)     Temp Source Oral     SpO2 98 %     Weight      Height      Head Circumference      Peak Flow      Pain Score      Pain Loc      Pain Education      Exclude from Growth Chart     Most recent vital signs: Vitals:   12/08/23 1016  BP: (!) 185/113  Pulse: 100  Resp: 18  Temp: 97.9 F (36.6 C)  SpO2: 98%   Per daughter he has severe dementia but has been acting and behaving normally to his most recent baselines   General: Awake, no distress.  Identifies himself by name.  He is in no distress.  Normocephalic atraumatic with exception to left periorbital edema/contusion primarily surrounding the left orbit and left eyebrow.  He has a small abrasion with bandage  and bleeding controlled without evidence of superinfection over the left lateral orbit.  The eye itself is visualizable appears to have significant cataract or corneal scarring, daughter reports this is normal and he has no blindness.  He also has some surrounding elements of subconjunctival hemorrhage.  The right eye appears normal CV:  Good peripheral perfusion.  Normal tone Resp:  Normal effort.  No distress speaks full clear normal respiratory Abd:  No distention.  Soft nontender Other:  Moves all extremities well able to move his arms and legs without significant difficulty no pain or discomfort through range of motion of all major joints.  His right foot he has a small area of blister or skin tear that has been bandaged there is no obvious surrounding infection     ED Results / Procedures / Treatments   Labs (all labs ordered are listed, but only abnormal results are displayed) Labs Reviewed - No data to  display   EKG     RADIOLOGY  CT Head Wo Contrast Result Date: 12/08/2023 CLINICAL DATA:  Provided history: Facial trauma, blunt. Additional history provided: Unwitnessed fall yesterday. On blood thinner. Left periorbital/head hematoma. Patient under hospice care. EXAM: CT HEAD WITHOUT CONTRAST CT MAXILLOFACIAL WITHOUT CONTRAST TECHNIQUE: Multidetector CT imaging of the head and maxillofacial structures were performed using the standard protocol without intravenous contrast. Multiplanar CT image reconstructions of the maxillofacial structures were also generated. RADIATION DOSE REDUCTION: This exam was performed according to the departmental dose-optimization program which includes automated exposure control, adjustment of the mA and/or kV according to patient size and/or use of iterative reconstruction technique. COMPARISON:  CT head and CT cervical spine 12/03/2023. FINDINGS: CT HEAD FINDINGS Brain: Generalized cerebral atrophy. Unchanged position of a right parietal approach ventricular shunt catheter, terminating within the body of the right lateral ventricle. Redemonstrated focus of chronic encephalomalacia/gliosis within the temporal, parietal, frontal and occipital lobes on the left. New from the recent prior head CT of 12/12/2013, there is a superimposed subcentimeter hyperdense focus at site of the encephalomalacia/gliosis in the parietooccipital region (series 2, image 21). This likely reflects a small acute parenchymal hemorrhage. Chronic encephalomalacia/gliosis also again demonstrated within the anteroinferior left frontal lobe. Stable background cerebral white matter chronic small vessel disease. No acute demarcated cortical infarct No midline shift. Vascular: No hyperdense vessel. Atherosclerotic calcifications. Redemonstrated aneurysm clips along the course of the distal left internal carotid artery. Skull: Prior left frontal, temporal and parietal craniotomy. Other: Left periorbital,  frontotemporal scalp and facial hematoma. CT MAXILLOFACIAL FINDINGS Osseous: No acute maxillofacial fracture is identified. Orbits: Left periorbital hematoma. No acute finding within the orbits. Sinuses: No significant paranasal sinus disease. Soft tissues: Left periorbital, frontotemporal scalp and facial hematoma. Attempts are being made to reach the ordering provider at this time. IMPRESSION: CT head: 1. Subcentimeter hyperdense focus in the left parietooccipital region, new from the recent prior head CT of 12/03/2023 and likely reflecting a small acute parenchymal hemorrhage. CT follow-up to resolution recommended. 2. Background parenchymal atrophy, chronic small vessel ischemic disease, sites of chronic encephalomalacia/gliosis and chronic postsurgical changes, as described. 3. Left periorbital, frontotemporal scalp and facial hematoma. CT maxillofacial: 1. No evidence of an acute maxillofacial fracture. 2. Left periorbital, frontotemporal scalp and facial hematoma. Electronically Signed   By: Rockey Childs D.O.   On: 12/08/2023 11:54   CT Maxillofacial Wo Contrast Result Date: 12/08/2023 CLINICAL DATA:  Provided history: Facial trauma, blunt. Additional history provided: Unwitnessed fall yesterday. On blood thinner. Left periorbital/head hematoma. Patient under  hospice care. EXAM: CT HEAD WITHOUT CONTRAST CT MAXILLOFACIAL WITHOUT CONTRAST TECHNIQUE: Multidetector CT imaging of the head and maxillofacial structures were performed using the standard protocol without intravenous contrast. Multiplanar CT image reconstructions of the maxillofacial structures were also generated. RADIATION DOSE REDUCTION: This exam was performed according to the departmental dose-optimization program which includes automated exposure control, adjustment of the mA and/or kV according to patient size and/or use of iterative reconstruction technique. COMPARISON:  CT head and CT cervical spine 12/03/2023. FINDINGS: CT HEAD FINDINGS  Brain: Generalized cerebral atrophy. Unchanged position of a right parietal approach ventricular shunt catheter, terminating within the body of the right lateral ventricle. Redemonstrated focus of chronic encephalomalacia/gliosis within the temporal, parietal, frontal and occipital lobes on the left. New from the recent prior head CT of 12/12/2013, there is a superimposed subcentimeter hyperdense focus at site of the encephalomalacia/gliosis in the parietooccipital region (series 2, image 21). This likely reflects a small acute parenchymal hemorrhage. Chronic encephalomalacia/gliosis also again demonstrated within the anteroinferior left frontal lobe. Stable background cerebral white matter chronic small vessel disease. No acute demarcated cortical infarct No midline shift. Vascular: No hyperdense vessel. Atherosclerotic calcifications. Redemonstrated aneurysm clips along the course of the distal left internal carotid artery. Skull: Prior left frontal, temporal and parietal craniotomy. Other: Left periorbital, frontotemporal scalp and facial hematoma. CT MAXILLOFACIAL FINDINGS Osseous: No acute maxillofacial fracture is identified. Orbits: Left periorbital hematoma. No acute finding within the orbits. Sinuses: No significant paranasal sinus disease. Soft tissues: Left periorbital, frontotemporal scalp and facial hematoma. Attempts are being made to reach the ordering provider at this time. IMPRESSION: CT head: 1. Subcentimeter hyperdense focus in the left parietooccipital region, new from the recent prior head CT of 12/03/2023 and likely reflecting a small acute parenchymal hemorrhage. CT follow-up to resolution recommended. 2. Background parenchymal atrophy, chronic small vessel ischemic disease, sites of chronic encephalomalacia/gliosis and chronic postsurgical changes, as described. 3. Left periorbital, frontotemporal scalp and facial hematoma. CT maxillofacial: 1. No evidence of an acute maxillofacial fracture.  2. Left periorbital, frontotemporal scalp and facial hematoma. Electronically Signed   By: Rockey Childs D.O.   On: 12/08/2023 11:54      PROCEDURES:  Critical Care performed: No  Procedures   MEDICATIONS ORDERED IN ED: Medications - No data to display   IMPRESSION / MDM / ASSESSMENT AND PLAN / ED COURSE  I reviewed the triage vital signs and the nursing notes.                              Differential diagnosis includes, but is not limited to, head injury, evidence of contusion hematoma around the left periorbital region with abrasion.  No evidence of acute superinfection.  Based on goals of care will obtain CT of the head though no further workup to be performed, and I anticipate other than from a diagnostic/ruling out intracranial hemorrhage standpoint no changes in care are anticipated based on his goals of care of comfort only.  No medical workup required, followed by hospice with comfort care only per daughter  Patient's presentation is most consistent with acute complicated illness / injury requiring diagnostic workup.      Clinical Course as of 12/08/23 1208  Mon Dec 08, 2023  1201 IMPRESSION:  CT head:   1. Subcentimeter hyperdense focus in the left parietooccipital  region, new from the recent prior head CT of 12/03/2023 and likely  reflecting a small acute parenchymal hemorrhage.  CT follow-up to  resolution recommended.  2. Background parenchymal atrophy, chronic small vessel ischemic  disease, sites of chronic encephalomalacia/gliosis and chronic  postsurgical changes, as described.  3. Left periorbital, frontotemporal scalp and facial hematoma.   CT maxillofacial:   1. No evidence of an acute maxillofacial fracture.  2. Left periorbital, frontotemporal scalp and facial hematoma.   [MQ]  1201 Personally discussed with Dr. Richelle, radiology.  [MQ]    Clinical Course User Index [MQ] Dicky Anes, MD   Blood pressure markedly elevated, we will not address  this.  He is currently on comfort care only and all medications with exception of lorazepam  have been removed.  This in keeping with his goals of care and recommendations from his healthcare power of attorney/daughter.  ----------------------------------------- 12:07 PM on 12/08/2023 -----------------------------------------  parenchymal hemorrhage noted on CT.  His mental status appears to be to his baseline.  There is a very small area of hemorrhage, I personally reviewed his imaging as well and discussed with radiologist.  Fall occurred yesterday.  He is not on any anticoagulation.  Given goals of care I discussed with his daughter and also cousin at the bedside.  Their goal of care is to return him to his care home, they are aware of the diagnosis and that he may return at any time we discussed specific return precautions around head injury or worsening but at this time patient's goals of care are strictly comfort and will return with hospice team to continue to follow at his facility at [facility] house.    FINAL CLINICAL IMPRESSION(S) / ED DIAGNOSES   Final diagnoses:  Traumatic left-sided intracerebral hemorrhage with loss of consciousness, initial encounter (HCC)  Hematoma of face, initial encounter     Rx / DC Orders   ED Discharge Orders     None        Note:  This document was prepared using Dragon voice recognition software and may include unintentional dictation errors.   Dicky Anes, MD 12/08/23 [phone]
